# Patient Record
Sex: Male | Born: 1955 | Race: White | Hispanic: No | Marital: Single | Smoking: Former smoker
Health system: Southern US, Community
[De-identification: ages and names within clinical notes are randomized; demographics above are authoritative.]

## PROBLEM LIST (undated history)

## (undated) DIAGNOSIS — M1990 Unspecified osteoarthritis, unspecified site: Secondary | ICD-10-CM

## (undated) DIAGNOSIS — E039 Hypothyroidism, unspecified: Secondary | ICD-10-CM

## (undated) DIAGNOSIS — I255 Ischemic cardiomyopathy: Secondary | ICD-10-CM

## (undated) DIAGNOSIS — R945 Abnormal results of liver function studies: Secondary | ICD-10-CM

## (undated) DIAGNOSIS — I2510 Atherosclerotic heart disease of native coronary artery without angina pectoris: Secondary | ICD-10-CM

## (undated) DIAGNOSIS — M4800 Spinal stenosis, site unspecified: Secondary | ICD-10-CM

## (undated) DIAGNOSIS — H5461 Unqualified visual loss, right eye, normal vision left eye: Secondary | ICD-10-CM

## (undated) DIAGNOSIS — R569 Unspecified convulsions: Secondary | ICD-10-CM

## (undated) DIAGNOSIS — R7989 Other specified abnormal findings of blood chemistry: Secondary | ICD-10-CM

## (undated) DIAGNOSIS — I213 ST elevation (STEMI) myocardial infarction of unspecified site: Secondary | ICD-10-CM

## (undated) DIAGNOSIS — I639 Cerebral infarction, unspecified: Secondary | ICD-10-CM

## (undated) DIAGNOSIS — I5022 Chronic systolic (congestive) heart failure: Secondary | ICD-10-CM

## (undated) DIAGNOSIS — F419 Anxiety disorder, unspecified: Secondary | ICD-10-CM

## (undated) DIAGNOSIS — G459 Transient cerebral ischemic attack, unspecified: Secondary | ICD-10-CM

## (undated) HISTORY — PX: TONSILLECTOMY: SUR1361

## (undated) HISTORY — DX: Unspecified osteoarthritis, unspecified site: M19.90

## (undated) HISTORY — PX: APPENDECTOMY: SHX54

## (undated) HISTORY — DX: Other specified abnormal findings of blood chemistry: R79.89

## (undated) HISTORY — DX: Cerebral infarction, unspecified: I63.9

## (undated) HISTORY — DX: Spinal stenosis, site unspecified: M48.00

## (undated) HISTORY — PX: BACK SURGERY: SHX140

## (undated) HISTORY — DX: Chronic systolic (congestive) heart failure: I50.22

## (undated) HISTORY — DX: Atherosclerotic heart disease of native coronary artery without angina pectoris: I25.10

## (undated) HISTORY — PX: NECK SURGERY: SHX720

## (undated) HISTORY — DX: Transient cerebral ischemic attack, unspecified: G45.9

## (undated) HISTORY — DX: Unqualified visual loss, right eye, normal vision left eye: H54.61

## (undated) HISTORY — DX: ST elevation (STEMI) myocardial infarction of unspecified site: I21.3

## (undated) HISTORY — DX: Ischemic cardiomyopathy: I25.5

## (undated) HISTORY — DX: Abnormal results of liver function studies: R94.5

---

## 2012-02-14 ENCOUNTER — Emergency Department (HOSPITAL_COMMUNITY): Payer: 59

## 2012-02-14 ENCOUNTER — Encounter (HOSPITAL_COMMUNITY): Payer: Self-pay | Admitting: *Deleted

## 2012-02-14 DIAGNOSIS — E039 Hypothyroidism, unspecified: Secondary | ICD-10-CM | POA: Diagnosis present

## 2012-02-14 DIAGNOSIS — D649 Anemia, unspecified: Secondary | ICD-10-CM | POA: Diagnosis not present

## 2012-02-14 DIAGNOSIS — R0789 Other chest pain: Principal | ICD-10-CM | POA: Diagnosis present

## 2012-02-14 DIAGNOSIS — Z8249 Family history of ischemic heart disease and other diseases of the circulatory system: Secondary | ICD-10-CM

## 2012-02-14 DIAGNOSIS — E860 Dehydration: Secondary | ICD-10-CM | POA: Diagnosis present

## 2012-02-14 DIAGNOSIS — F411 Generalized anxiety disorder: Secondary | ICD-10-CM | POA: Diagnosis present

## 2012-02-14 DIAGNOSIS — G40909 Epilepsy, unspecified, not intractable, without status epilepticus: Secondary | ICD-10-CM | POA: Diagnosis present

## 2012-02-14 DIAGNOSIS — Z823 Family history of stroke: Secondary | ICD-10-CM

## 2012-02-14 DIAGNOSIS — F17200 Nicotine dependence, unspecified, uncomplicated: Secondary | ICD-10-CM | POA: Diagnosis present

## 2012-02-14 DIAGNOSIS — E785 Hyperlipidemia, unspecified: Secondary | ICD-10-CM | POA: Diagnosis present

## 2012-02-14 LAB — CBC WITH DIFFERENTIAL/PLATELET
Basophils Absolute: 0 10*3/uL (ref 0.0–0.1)
Eosinophils Relative: 2 % (ref 0–5)
HCT: 40.9 % (ref 39.0–52.0)
Lymphocytes Relative: 33 % (ref 12–46)
Lymphs Abs: 2.8 10*3/uL (ref 0.7–4.0)
MCV: 93.4 fL (ref 78.0–100.0)
Monocytes Absolute: 0.5 10*3/uL (ref 0.1–1.0)
RDW: 11.9 % (ref 11.5–15.5)
WBC: 8.6 10*3/uL (ref 4.0–10.5)

## 2012-02-14 LAB — POCT I-STAT TROPONIN I: Troponin i, poc: 0.01 ng/mL (ref 0.00–0.08)

## 2012-02-14 NOTE — ED Notes (Signed)
Pt states that at work his Chest started hurting about an hour and a half ago. Pt states that the pain went away and about 30 min ago the pain started again. Pt states that the pain went to his left fingers and felt like tingling. Pt took 2 baby aspirins at work.

## 2012-02-15 ENCOUNTER — Inpatient Hospital Stay (HOSPITAL_COMMUNITY)
Admission: EM | Admit: 2012-02-15 | Discharge: 2012-02-16 | DRG: 313 | Disposition: A | Discharge status: Home / Self Care | Payer: 59 | Attending: Internal Medicine | Admitting: Internal Medicine

## 2012-02-15 ENCOUNTER — Encounter (HOSPITAL_COMMUNITY): Payer: Self-pay | Admitting: Internal Medicine

## 2012-02-15 DIAGNOSIS — Z720 Tobacco use: Secondary | ICD-10-CM | POA: Diagnosis present

## 2012-02-15 DIAGNOSIS — Z7289 Other problems related to lifestyle: Secondary | ICD-10-CM

## 2012-02-15 DIAGNOSIS — F17200 Nicotine dependence, unspecified, uncomplicated: Secondary | ICD-10-CM

## 2012-02-15 DIAGNOSIS — I200 Unstable angina: Secondary | ICD-10-CM | POA: Diagnosis present

## 2012-02-15 DIAGNOSIS — R079 Chest pain, unspecified: Secondary | ICD-10-CM

## 2012-02-15 DIAGNOSIS — F1090 Alcohol use, unspecified, uncomplicated: Secondary | ICD-10-CM

## 2012-02-15 DIAGNOSIS — I209 Angina pectoris, unspecified: Secondary | ICD-10-CM

## 2012-02-15 HISTORY — DX: Hypothyroidism, unspecified: E03.9

## 2012-02-15 HISTORY — DX: Unspecified convulsions: R56.9

## 2012-02-15 HISTORY — DX: Anxiety disorder, unspecified: F41.9

## 2012-02-15 LAB — COMPREHENSIVE METABOLIC PANEL
BUN: 24 mg/dL — ABNORMAL HIGH (ref 6–23)
CO2: 24 mEq/L (ref 19–32)
Calcium: 10.4 mg/dL (ref 8.4–10.5)
Creatinine, Ser: 1.26 mg/dL (ref 0.50–1.35)
GFR calc Af Amer: 71 mL/min — ABNORMAL LOW (ref >90)
GFR calc non Af Amer: 61 mL/min — ABNORMAL LOW (ref >90)
Glucose, Bld: 104 mg/dL — ABNORMAL HIGH (ref 70–99)

## 2012-02-15 LAB — LIPID PANEL
HDL: 53 mg/dL (ref >39)
LDL Cholesterol: 178 mg/dL — ABNORMAL HIGH (ref 0–99)
Triglycerides: 97 mg/dL (ref <150)
VLDL: 19 mg/dL (ref 0–40)

## 2012-02-15 LAB — PROTIME-INR
INR: 1.02 (ref 0.00–1.49)
Prothrombin Time: 13.6 seconds (ref 11.6–15.2)

## 2012-02-15 LAB — TSH: TSH: 8.191 u[IU]/mL — ABNORMAL HIGH (ref 0.350–4.500)

## 2012-02-15 LAB — HEMOGLOBIN A1C: Hgb A1c MFr Bld: 5.7 % — ABNORMAL HIGH (ref <5.7)

## 2012-02-15 LAB — TROPONIN I
Troponin I: <0.3 ng/mL (ref <0.30)
Troponin I: <0.3 ng/mL (ref <0.30)

## 2012-02-15 LAB — POCT I-STAT TROPONIN I: Troponin i, poc: 0 ng/mL (ref 0.00–0.08)

## 2012-02-15 LAB — HEPARIN LEVEL (UNFRACTIONATED): Heparin Unfractionated: 0.19 IU/mL — ABNORMAL LOW (ref 0.30–0.70)

## 2012-02-15 LAB — MRSA PCR SCREENING: MRSA by PCR: NEGATIVE

## 2012-02-15 MED ORDER — HEPARIN BOLUS VIA INFUSION
2500.0000 [IU] | Freq: Once | INTRAVENOUS | Status: AC
  Administered 2012-02-15: 2500 [IU] via INTRAVENOUS
  Filled 2012-02-15: qty 2500

## 2012-02-15 MED ORDER — ASPIRIN EC 81 MG PO TBEC
81.0000 mg | DELAYED_RELEASE_TABLET | Freq: Every day | ORAL | Status: DC
  Administered 2012-02-16: 81 mg via ORAL
  Filled 2012-02-15: qty 1

## 2012-02-15 MED ORDER — CARVEDILOL 6.25 MG PO TABS
6.2500 mg | ORAL_TABLET | Freq: Two times a day (BID) | ORAL | Status: DC
  Administered 2012-02-15 – 2012-02-16 (×2): 6.25 mg via ORAL
  Filled 2012-02-15 (×4): qty 1

## 2012-02-15 MED ORDER — CARVEDILOL 3.125 MG PO TABS
3.1250 mg | ORAL_TABLET | Freq: Two times a day (BID) | ORAL | Status: DC
  Administered 2012-02-15: 3.125 mg via ORAL
  Filled 2012-02-15 (×3): qty 1

## 2012-02-15 MED ORDER — ONDANSETRON HCL 4 MG/2ML IJ SOLN: 4.0000 mg | Freq: Four times a day (QID) | INTRAMUSCULAR | Status: DC | PRN

## 2012-02-15 MED ORDER — ATORVASTATIN CALCIUM 40 MG PO TABS
40.0000 mg | ORAL_TABLET | Freq: Every day | ORAL | Status: DC
  Administered 2012-02-15: 40 mg via ORAL
  Filled 2012-02-15 (×2): qty 1

## 2012-02-15 MED ORDER — HEPARIN BOLUS VIA INFUSION
4000.0000 [IU] | Freq: Once | INTRAVENOUS | Status: AC
  Administered 2012-02-15: 4000 [IU] via INTRAVENOUS
  Filled 2012-02-15: qty 4000

## 2012-02-15 MED ORDER — ALPRAZOLAM 0.25 MG PO TABS
0.2500 mg | ORAL_TABLET | Freq: Two times a day (BID) | ORAL | Status: DC | PRN
  Administered 2012-02-15: 0.25 mg via ORAL
  Filled 2012-02-15: qty 1

## 2012-02-15 MED ORDER — NITROGLYCERIN 0.4 MG SL SUBL
0.4000 mg | SUBLINGUAL_TABLET | SUBLINGUAL | Status: DC | PRN
  Administered 2012-02-15: 0.4 mg via SUBLINGUAL
  Filled 2012-02-15: qty 25

## 2012-02-15 MED ORDER — ASPIRIN 81 MG PO CHEW
324.0000 mg | CHEWABLE_TABLET | ORAL | Status: AC
  Administered 2012-02-15: 324 mg via ORAL
  Filled 2012-02-15: qty 4

## 2012-02-15 MED ORDER — SODIUM CHLORIDE 0.9 % IJ SOLN: 3.0000 mL | INTRAMUSCULAR | Status: DC | PRN

## 2012-02-15 MED ORDER — SODIUM CHLORIDE 0.9 % IV SOLN: INTRAVENOUS | Status: DC

## 2012-02-15 MED ORDER — SODIUM CHLORIDE 0.9 % IV SOLN
INTRAVENOUS | Status: DC
  Administered 2012-02-15: 75 mL/h via INTRAVENOUS
  Administered 2012-02-16: 14:00:00 via INTRAVENOUS

## 2012-02-15 MED ORDER — SODIUM CHLORIDE 0.9 % IJ SOLN: 3.0000 mL | Freq: Two times a day (BID) | INTRAMUSCULAR | Status: DC

## 2012-02-15 MED ORDER — ASPIRIN 325 MG PO TABS
325.0000 mg | ORAL_TABLET | Freq: Once | ORAL | Status: AC
  Administered 2012-02-15: 325 mg via ORAL
  Filled 2012-02-15: qty 1

## 2012-02-15 MED ORDER — SODIUM CHLORIDE 0.9 % IV SOLN: 250.0000 mL | INTRAVENOUS | Status: DC | PRN

## 2012-02-15 MED ORDER — LEVOTHYROXINE SODIUM 112 MCG PO TABS
112.0000 ug | ORAL_TABLET | Freq: Every day | ORAL | Status: DC
  Administered 2012-02-15 – 2012-02-16 (×2): 112 ug via ORAL
  Filled 2012-02-15 (×3): qty 1

## 2012-02-15 MED ORDER — ASPIRIN 300 MG RE SUPP
300.0000 mg | RECTAL | Status: AC
  Filled 2012-02-15: qty 1

## 2012-02-15 MED ORDER — ATORVASTATIN CALCIUM 10 MG PO TABS
10.0000 mg | ORAL_TABLET | Freq: Every day | ORAL | Status: DC
  Filled 2012-02-15: qty 1

## 2012-02-15 MED ORDER — HEPARIN (PORCINE) IN NACL 100-0.45 UNIT/ML-% IJ SOLN
1550.0000 [IU]/h | INTRAMUSCULAR | Status: DC
  Administered 2012-02-15: 1200 [IU]/h via INTRAVENOUS
  Filled 2012-02-15 (×3): qty 250

## 2012-02-15 MED ORDER — ACETAMINOPHEN 325 MG PO TABS: 650.0000 mg | ORAL_TABLET | ORAL | Status: DC | PRN

## 2012-02-15 NOTE — H&P (Signed)
PCP:  Dr. Tresa Endo in [place]    Chief Complaint:   Chest pain  HPI: Randy Beasley is a 56 y.o. male   has a past medical history of Thyroid disease; Arthritis; Anxiety; and Seizures.   Presented with  He has been doing some work and started to have chest pain that at first lasted few seconds. 20 min later he had severe chest pain lasting for about 10-20 min. He had been getting nausea associated with this. No shortness of breath. Describes pain as substernal at first it was nagging. The second episode was sharp. Now he is having intermittent chest pressure. Currently it is almost completely resolved. He never had a stress test done. Never seen a cardiologist. He has had some stressful event recently being separated from his wife. HE states his chest pain is worse with exertion.   Review of Systems:    Pertinent positives include: chest pain, nausea,  Constitutional:  No weight loss, night sweats, Fevers, chills, fatigue, weight loss  HEENT:  No headaches, Difficulty swallowing,Tooth/dental problems,Sore throat,  No sneezing, itching, ear ache, nasal congestion, post nasal drip,  Cardio-vascular:  No  Orthopnea, PND, anasarca, dizziness, palpitations.no Bilateral lower extremity swelling  GI:  No heartburn, indigestion, abdominal pain,  vomiting, diarrhea, change in bowel habits, loss of appetite, melena, blood in stool, hematemesis Resp:  no shortness of breath at rest. No dyspnea on exertion, No excess mucus, no productive cough, No non-productive cough, No coughing up of blood.No change in color of mucus.No wheezing. Skin:  no rash or lesions. No jaundice GU:  no dysuria, change in color of urine, no urgency or frequency. No straining to urinate.  No flank pain.  Musculoskeletal:  No joint pain or no joint swelling. No decreased range of motion. No back pain.  Psych:  No change in mood or affect. No depression or anxiety. No memory loss.  Neuro: no localizing  neurological complaints, no tingling, no weakness, no double vision, no gait abnormality, no slurred speech, no confusion  Otherwise ROS are negative except for above, 10 systems were reviewed  Past Medical History: Past Medical History  Diagnosis Date  . Thyroid disease   . Arthritis   . Anxiety   . Seizures     20 years ago   Past Surgical History  Procedure Date  . Neck surgery      Medications: Prior to Admission medications   Medication Sig Start Date End Date Taking? Authorizing Provider  levothyroxine (SYNTHROID, LEVOTHROID) 112 MCG tablet Take 112 mcg by mouth daily.   Yes Historical Provider, MD  LINOLEIC ACID CONJUGATED PO Take 1 tablet by mouth daily.   Yes Historical Provider, MD  Multiple Vitamin (MULTIVITAMIN WITH MINERALS) TABS Take 1 tablet by mouth daily.   Yes Historical Provider, MD  Naproxen Sodium (ALEVE PO) Take 2 tablets by mouth daily.   Yes Historical Provider, MD  Nutritional Supplements (DHEA PO) Take 1 tablet by mouth daily.   Yes Historical Provider, MD    Allergies:  No Known Allergies  Social History:  Ambulatory  independently  Lives at home   reports that he has been smoking.  He does not have any smokeless tobacco history on file. He reports that he drinks alcohol. He reports that he does not use illicit drugs.   Family History: family history includes Heart disease in his brother; Hypertension in his mother; Peripheral vascular disease in his mother; and Stroke in his father.    Physical Exam: Patient  Vitals for the past 24 hrs:  BP Temp Temp src Pulse Resp SpO2 Height Weight  02/15/12 0434 135/100 mmHg 98.7 F (37.1 C) Oral 71  15  100 % 5\' 11"  (1.803 m) 92.987 kg (205 lb)  02/15/12 0258 128/96 mmHg 98.3 F (36.8 C) Oral 71  16  99 % - -  02/14/12 2255 144/99 mmHg 97.9 F (36.6 C) Oral 87  16  98 % - -    1. General:  in No Acute distress 2. Psychological: Alert and Oriented 3. Head/ENT:   Moist  Mucous Membranes                           Head Non traumatic, neck supple                          Normal  Dentition 4. SKIN: normal  Skin turgor,  Skin clean Dry and intact no rash 5. Heart: Regular rate and rhythm no Murmur, Rub or gallop 6. Lungs: Clear to auscultation bilaterally, no wheezes or crackles   7. Abdomen: Soft, non-tender, Non distended 8. Lower extremities: no clubbing, cyanosis, or edema 9. Neurologically Grossly intact, moving all 4 extremities equally 10. MSK: Normal range of motion  body mass index is 28.59 kg/(m^2).   Labs on Admission:   [facility] 02/14/12 2255  NA 139  K 3.9  CL 102  CO2 24  GLUCOSE 104*  BUN 24*  CREATININE 1.26  CALCIUM 10.4  MG --  PHOS --    Basename 02/14/12 2255  AST 38*  ALT 36  ALKPHOS 81  BILITOT 1.0  PROT 7.9  ALBUMIN 5.0   No results found for this basename: LIPASE:2,AMYLASE:2 in the last 72 hours  Basename 02/14/12 2255  WBC 8.6  NEUTROABS 5.0  HGB 14.2  HCT 40.9  MCV 93.4  PLT 282   No results found for this basename: CKTOTAL:3,CKMB:3,CKMBINDEX:3,TROPONINI:3 in the last 72 hours No results found for this basename: TSH,T4TOTAL,FREET3,T3FREE,THYROIDAB in the last 72 hours No results found for this basename: VITAMINB12:2,FOLATE:2,FERRITIN:2,TIBC:2,IRON:2,RETICCTPCT:2 in the last 72 hours No results found for this basename: HGBA1C    Estimated Creatinine Clearance: 74.5 ml/min (by C-G formula based on Cr of 1.26). ABG No results found for this basename: phart, pco2, po2, hco3, tco2, acidbasedef, o2sat     No results found for this basename: DDIMER     Other results:  I have pearsonaly reviewed this: ECG REPORT  Rate: 86  Rhythm: Normal Sinus ST&T Change: no ischemic changes   Cultures: No results found for this basename: sdes, specrequest, cult, reptstatus       Radiological Exams on Admission: Dg Chest 2 View  02/14/2012  *RADIOLOGY REPORT*  Clinical Data:  Left-sided chest pain.  CHEST - 2 VIEW  Comparison: None   Findings: The heart size and mediastinal contours are within normal limits.  Both lungs are clear.  The visualized skeletal structures are unremarkable.  IMPRESSION: No active disease.   Original Report Authenticated By: Reola Calkins, M.D.     Chart has been reviewed  Assessment/Plan  56 yo M with hx of tobacco abuse here with chest pain worrisome for unstable angina. So far cardiac markers are negative ECG non ischemic  Present on Admission:  .Unstable angina - will admit to step down, start on heparin, patient received aspirin already,. Will make sure he is on betablocker and statin. Keep NPO until he is  going to be seen by cardiology. Please call cardiology consult in AM .Chest pain - worrisome for angina .Tobacco abuse - spoke to him about quiting Alcohol use - he have been drinking heavier for the past 5 years. denies ever having any withdraw. Will need to watch him carefully for any withdrawal symptoms   Prophylaxis:  Lovenox, Protonix  CODE STATUS:FUUL CODE  Other plan as per orders.  I have spent a total of 55 min on this admission  Lorris Carducci 02/15/2012, 5:56 AM

## 2012-02-15 NOTE — ED Provider Notes (Signed)
History     CSN: [id number]  Arrival date & time 02/14/12  2249   First MD Initiated Contact with Patient 02/15/12 [phone]      Chief Complaint  Patient presents with  . Chest Pain    (Consider location/radiation/quality/duration/timing/severity/associated sxs/prior treatment) Patient is a 56 y.o. male presenting with chest pain. The history is provided by the patient.  Chest Pain Primary symptoms include shortness of breath and dizziness. Pertinent negatives for primary symptoms include no palpitations, no abdominal pain, no nausea and no vomiting.  Dizziness also occurs with diaphoresis. Dizziness does not occur with nausea or vomiting.   Associated symptoms include diaphoresis.    56 year old, male, smoker presents emergency department complaining of left-sided chest pain, with numbness in his left arm, associated with sweating, lightheadedness, and shortness of breath.  He denies nausea, vomiting.  He is never had these symptoms before.  He denies cough, fevers, chills, leg pain or swelling.  He took aspirin and now.  His symptoms have subsided.  His brother.  Also had a heart attack.  Past Medical History  Diagnosis Date  . Thyroid disease   . Arthritis   . Anxiety   . Seizures     20 years ago    Past Surgical History  Procedure Date  . Neck surgery     History reviewed. No pertinent family history.  History  Substance Use Topics  . Smoking status: Current Everyday Smoker  . Smokeless tobacco: Not on file  . Alcohol Use: Yes      Review of Systems  Constitutional: Positive for diaphoresis.  HENT: Negative for neck pain.   Respiratory: Positive for chest tightness and shortness of breath.   Cardiovascular: Positive for chest pain. Negative for palpitations and leg swelling.  Gastrointestinal: Negative for nausea, vomiting and abdominal pain.  Neurological: Positive for dizziness and light-headedness.  Psychiatric/Behavioral: Negative for confusion.  All  other systems reviewed and are negative.    Allergies  Review of patient's allergies indicates no known allergies.  Home Medications   Current Outpatient Rx  Name Route Sig Dispense Refill  . LEVOTHYROXINE SODIUM 112 MCG PO TABS Oral Take 112 mcg by mouth daily.    Marland Kitchen LINOLEIC ACID CONJUGATED PO Oral Take 1 tablet by mouth daily.    . ADULT MULTIVITAMIN W/MINERALS CH Oral Take 1 tablet by mouth daily.    . ALEVE PO Oral Take 2 tablets by mouth daily.    Marland Kitchen DHEA PO Oral Take 1 tablet by mouth daily.      BP 135/100  Pulse 71  Temp 98.7 F (37.1 C) (Oral)  Resp 15  Ht 5\' 11"  (1.803 m)  Wt 205 lb (92.987 kg)  BMI 28.59 kg/m2  SpO2 100%  Physical Exam  Nursing note and vitals reviewed. Constitutional: He is oriented to person, place, and time. He appears well-developed and well-nourished. No distress.  HENT:  Head: Normocephalic and atraumatic.  Eyes: Conjunctivae are normal.  Neck: Normal range of motion. Neck supple.  Cardiovascular: Normal rate, regular rhythm and intact distal pulses.   No murmur heard. Pulmonary/Chest: Effort normal and breath sounds normal. No respiratory distress. He has no rales.  Abdominal: Soft. Bowel sounds are normal. He exhibits no distension.  Musculoskeletal: Normal range of motion. He exhibits no edema and no tenderness.  Neurological: He is alert and oriented to person, place, and time.  Skin: Skin is warm and dry.  Psychiatric: He has a normal mood and affect. Thought content normal.  ED Course  Procedures (including critical care time) 56 year old, male, smoker presents emergency department with chest pain.  Left arm, numbness, shortness of breath, sweating, and lightheadedness.  Symptoms resolved.  Now.  He has never had these symptoms before.  EKG does not show any signs of cardiac ischemia.  His first set of cardiac enzymes, is negative.  Him concerned he has unstable angina.  Will give him aspirin and admit to the hospital for further  evaluation  Labs Reviewed  COMPREHENSIVE METABOLIC PANEL - Abnormal; Notable for the following:    Glucose, Bld 104 (*)     BUN 24 (*)     AST 38 (*)     GFR calc non Af Amer 61 (*)     GFR calc Af Amer 71 (*)     All other components within normal limits  CBC WITH DIFFERENTIAL  POCT I-STAT TROPONIN I   Dg Chest 2 View  02/14/2012  *RADIOLOGY REPORT*  Clinical Data:  Left-sided chest pain.  CHEST - 2 VIEW  Comparison: None  Findings: The heart size and mediastinal contours are within normal limits.  Both lungs are clear.  The visualized skeletal structures are unremarkable.  IMPRESSION: No active disease.   Original Report Authenticated By: Reola Calkins, M.D.      No diagnosis found.   Date: 02/15/2012  Rate: 86  Rhythm: normal sinus rhythm  QRS Axis: normal  Intervals: normal  ST/T Wave abnormalities: normal  Conduction Disutrbances: none  Narrative Interpretation: unremarkable     MDM  Unstable angina        Cheri Guppy, MD 02/15/12 [phone]

## 2012-02-15 NOTE — Progress Notes (Signed)
ANTICOAGULATION CONSULT NOTE - Initial Consult  Pharmacy Consult for Heparin  Indication: chest pain/ACS  No Known Allergies  Patient Measurements: Height: 5\' 11"  (180.3 cm) Weight: 205 lb (92.987 kg) IBW/kg (Calculated) : 75.3  Heparin Dosing Weight: 93kg  Vital Signs: Temp: 98.7 F (37.1 C) (09/01 0800) Temp src: Oral (09/01 0800) BP: 128/82 mmHg (09/01 0858) Pulse Rate: 66  (09/01 0858)  Labs:  Basename 02/14/12 2255  HGB 14.2  HCT 40.9  PLT 282  APTT --  LABPROT --  INR --  HEPARINUNFRC --  CREATININE 1.26  CKTOTAL --  CKMB --  TROPONINI --    Estimated Creatinine Clearance: 74.5 ml/min (by C-G formula based on Cr of 1.26).   Medical History: Past Medical History  Diagnosis Date  . Thyroid disease   . Arthritis   . Anxiety   . Seizures     20 years ago    Assessment: 86 yom admitted for possible ACS. Pharmacy consulted to dose heparin. Initial aptt/INR not yet resulted. Talked to patient and confirmed weight and any history of bleeding disorder. Hgb ok at 14.2, plts 282. Cardiac enzymes have been negative so far.   Goal of Therapy:  Heparin level 0.3-0.7 units/ml Monitor platelets by anticoagulation protocol: Yes   Plan:  1) Heparin bolus of 4000 units 2) Heparin drip 1200 units/hr 3) Obtain 6 hour heparin level  [employer], Pharm.D. Clinical Pharmacist   Pager: [phone] Phone: [phone] 02/15/2012 9:15 AM

## 2012-02-15 NOTE — Progress Notes (Signed)
ANTICOAGULATION CONSULT NOTE - Follow Up Consult  Pharmacy Consult for Heparin Indication: chest pain/ACS  No Known Allergies  Patient Measurements: Height: 5\' 11"  (180.3 cm) Weight: 205 lb (92.987 kg) IBW/kg (Calculated) : 75.3  Heparin Dosing Weight: 93kg  Vital Signs: Temp: 98.2 F (36.8 C) (09/01 1532) Temp src: Oral (09/01 1532) BP: 115/71 mmHg (09/01 1716) Pulse Rate: 72  (09/01 1716)  Labs:  Basename 02/15/12 1635 02/15/12 1402 02/15/12 0915 02/14/12 2255  HGB -- -- -- 14.2  HCT -- -- -- 40.9  PLT -- -- -- 282  APTT -- -- 30 --  LABPROT -- -- 13.6 --  INR -- -- 1.02 --  HEPARINUNFRC 0.19* -- -- --  CREATININE -- -- -- 1.26  CKTOTAL -- -- -- --  CKMB -- -- -- --  TROPONINI -- <0.30 <0.30 --    Estimated Creatinine Clearance: 74.5 ml/min (by C-G formula based on Cr of 1.26).   Medications:  Heparin 1200 units/hr  Assessment: Randy Beasley on heparin for ACS. Heparin level (0.19) is subtherapeutic. RN reports no problem with line or infusion. - BL INR: 1.02 - H/H and Plts wnl - No significant bleeding reported  Goal of Therapy:  Heparin level 0.3-0.7 units/ml Monitor platelets by anticoagulation protocol: Yes   Plan:  1. Heparin IV bolus 2500 units x 1 2. Increase heparin drip to 1550 units/hr (15.5 ml/hr) 3. Check heparin level 6 hours after rate increase 4. Daily HL and CBC  Cleon Dew [phone] 02/15/2012,5:52 PM

## 2012-02-15 NOTE — Consult Note (Signed)
CARDIOLOGY CONSULT NOTE    Patient ID: VENSON FERENCZ MRN: [id number] DOB/AGE: [birth year] 58 y.o.  Admit date: 02/15/2012 Referring Physician Calvert Cantor MD Primary Physician Olivia Canter MD, Kathryne Sharper, [place] Primary Cardiologist N/A Reason for Consultation Chest pain.  HPI: [name] is a 56 year old white male seen for evaluation of chest pain at the request of the hospitalist service. He has a history of tobacco abuse. He was admitted last night for evaluation chest pain. While at work he initially developed a burning sensation in his left precordium. This lasts for several minutes and then seemed to resolve. Later, while working in the hot humid environment he developed more severe left precordial chest pain. This was described as sharp and stabbing. It was more intense. He denied any shortness of breath or diaphoresis. He did have some tingling in the fingers of his left hand. The symptoms lasted 20-30 minutes and then seemed to resolve. However he has had recurrent episodes of chest pain even here in the hospital of a dull nature. Sublingual nitroglycerin did not seem to offer any benefit. He is currently pain-free. He denies any prior history of cardiac disease. He's had no prior stress testing. He smokes one half to one pack per day. He has a history of hypercholesterolemia that is untreated. He denies a history of hypertension or diabetes. His family history is remarkable for stroke and hypertension. He denies any claudication symptoms. He has no history of TIA or stroke. Patient is currently under increased stress related to marital difficulties.  Review of systems complete and found to be negative unless listed above   Past Medical History  Diagnosis Date  . Thyroid disease   . Arthritis   . Anxiety   . Seizures     20 years ago    Family History  Problem Relation Age of Onset  . Hypertension Mother   . Peripheral vascular disease Mother   . Stroke Father   . Heart disease  Brother     History   Social History  . Marital Status: Single    Spouse Name: N/A    Number of Children: N/A  . Years of Education: N/A   Occupational History  . Not on file.   Social History Main Topics  . Smoking status: Current Everyday Smoker -- 42 years  . Smokeless tobacco: Not on file  . Alcohol Use: Yes     case of beer a week, 3-4 beers a day, 5 years.  . Drug Use: No  . Sexually Active:    Other Topics Concern  . Not on file   Social History Narrative  . No narrative on file    Past Surgical History  Procedure Date  . Neck surgery      Prescriptions prior to admission  Medication Sig Dispense Refill  . levothyroxine (SYNTHROID, LEVOTHROID) 112 MCG tablet Take 112 mcg by mouth daily.      Marland Kitchen LINOLEIC ACID CONJUGATED PO Take 1 tablet by mouth daily.      . Multiple Vitamin (MULTIVITAMIN WITH MINERALS) TABS Take 1 tablet by mouth daily.      . Naproxen Sodium (ALEVE PO) Take 2 tablets by mouth daily.      . Nutritional Supplements (DHEA PO) Take 1 tablet by mouth daily.        Physical Exam: Blood pressure 128/82, pulse 66, temperature 98 F (36.7 C), temperature source Oral, resp. rate 14, height 5\' 11"  (1.803 m), weight 92.987 kg (205 lb), SpO2 99.00%.  He is a pleasant , middle-aged white male in no acute distress. The patient is alert and oriented x 3.  The mood and affect are normal.  The skin is warm and dry.  Color is normal.  The HEENT exam reveals that the sclera are nonicteric.  The mucous membranes are moist.  The carotids are 2+ without bruits.  There is no thyromegaly.  There is no JVD. He has an old cervical surgical scar. The lungs are clear.  The chest wall is non tender.  The heart exam reveals a regular rate with a normal S1 and S2.  There are no murmurs, gallops, or rubs.  The PMI is not displaced.   Abdominal exam reveals good bowel sounds.  There is no guarding or rebound.  There is no hepatosplenomegaly or tenderness.  There are no masses.   Exam of the legs reveal no clubbing, cyanosis, or edema.  The legs are without rashes.  The distal pulses are intact.  Cranial nerves II - XII are intact.  Motor and sensory functions are intact.  The gait is normal.  Labs:   Lab Results  Component Value Date   WBC 8.6 02/14/2012   HGB 14.2 02/14/2012   HCT 40.9 02/14/2012   MCV 93.4 02/14/2012   PLT 282 02/14/2012    Lab 02/14/12 2255  NA 139  K 3.9  CL 102  CO2 24  BUN 24*  CREATININE 1.26  CALCIUM 10.4  PROT 7.9  BILITOT 1.0  ALKPHOS 81  ALT 36  AST 38*  GLUCOSE 104*   Lab Results  Component Value Date   TROPONINI <0.30 02/15/2012    Lab Results  Component Value Date   CHOL 250* 02/15/2012   Lab Results  Component Value Date   HDL 53 02/15/2012   Lab Results  Component Value Date   LDLCALC 178* 02/15/2012   Lab Results  Component Value Date   TRIG 97 02/15/2012   Lab Results  Component Value Date   CHOLHDL 4.7 02/15/2012   No results found for this basename: LDLDIRECT      Radiology: No active cardiopulmonary disease EKG: Normal sinus rhythm, normal ECG.  ASSESSMENT AND PLAN:  1. Chest pain. He has typical and atypical features for angina. Cardiac risk factors include tobacco use, hyperlipidemia, and family history of vascular disease. Initial ECG and cardiac enzymes were all negative. Recommend continued IV heparin. Will treat with aspirin, beta blocker, and statin. We will arrange a stress Myoview study in the a.m.  2. Hypercholesterolemia. Recommend starting statin therapy.  3. Tobacco abuse. Patient counseled on the need for smoking cessation.  SignedTheron Arista [facility] 02/15/2012, 11:42 AM

## 2012-02-15 NOTE — Progress Notes (Signed)
[facility]  56 y/o male admitted this AM with c/o chest pain. He is alert and able to provide a history. His pain began yesterday evening while on the phone at work- he describes it as burning, dull - it faded within a min. It occurred again when he was doing stremous activity at work (moving items). At this time it was intense, tight and caused him to stop working and sit down. It was about 8/10 and eventually faded when we relaxed. At that time he noted tingling in his left 4th and 5th fingers. He was diaphoretic but this was from the exertion even prior to the pain. His co-worker decided to bring him to the ER and en route he had milder episodes. In there ER he noted the pain intensified after ambulating to have blood work done. Pain appeared to improved but upon my eval he admits to 2/10 pain, pointing to his left upper chest, radiating in between his shoulder blades, not improving with Nitro. He feels anxious and is hesitant to take an anxiolytic but eventually states he will try it.  While obtaining history, he admits a great deal of stress in his personal life currently. He has been smoking 1-1 1/2 packs of cigarrets per day, drinking about 2 beers on week days and about 4 on weekends.  Case discussed with Dr Graciela Husbands who will evaluate in consult.   Physical Exam is within normal limits- no reproducible pain on exam  A/P Active Problems:  Chest pain Angina vs stress induced- suspect he will at the minimum need a stress test- EKG is unremarkable and Troponins are negative thus far. B Blocker and ASA added- ECHO, Lipid panel and A1c ordered.    Tobacco abuse Nicotine patch offered but declined by the patient.    Alcohol use Monitor for withdrawal - doubtful that he will have withdrawal based on the amount of drinking he admits to.   Hypothyroid Cont Synthroid- TSH ordered  Dehydration Based on BUn/Cr ratio and clinical exam- start slow hydration and f/u labs in AM  Anxiety Prn  Xanax for now  Arthritis Takes daily Naprosyn especially on work days due to chronic back pain  Remote history of seizures Not on medication for this currently  Calvert Cantor, MD [phone]

## 2012-02-15 NOTE — ED Notes (Signed)
MD at bedside. 

## 2012-02-16 ENCOUNTER — Inpatient Hospital Stay (HOSPITAL_COMMUNITY): Payer: 59

## 2012-02-16 DIAGNOSIS — I200 Unstable angina: Secondary | ICD-10-CM

## 2012-02-16 DIAGNOSIS — R079 Chest pain, unspecified: Secondary | ICD-10-CM

## 2012-02-16 DIAGNOSIS — Z7289 Other problems related to lifestyle: Secondary | ICD-10-CM

## 2012-02-16 LAB — CBC
Hemoglobin: 12.6 g/dL — ABNORMAL LOW (ref 13.0–17.0)
MCHC: 33.8 g/dL (ref 30.0–36.0)
RDW: 12.2 % (ref 11.5–15.5)
WBC: 6.8 10*3/uL (ref 4.0–10.5)

## 2012-02-16 LAB — PROTIME-INR
INR: 1.02 (ref 0.00–1.49)
Prothrombin Time: 13.6 seconds (ref 11.6–15.2)

## 2012-02-16 LAB — BASIC METABOLIC PANEL
GFR calc Af Amer: 84 mL/min — ABNORMAL LOW (ref >90)
GFR calc non Af Amer: 72 mL/min — ABNORMAL LOW (ref >90)
Potassium: 4.1 mEq/L (ref 3.5–5.1)
Sodium: 138 mEq/L (ref 135–145)

## 2012-02-16 MED ORDER — ASPIRIN 81 MG PO TBEC: 81.0000 mg | DELAYED_RELEASE_TABLET | Freq: Every day | ORAL | Status: AC

## 2012-02-16 MED ORDER — TECHNETIUM TC 99M TETROFOSMIN IV KIT
30.0000 | PACK | Freq: Once | INTRAVENOUS | Status: AC | PRN
  Administered 2012-02-16: 30 via INTRAVENOUS

## 2012-02-16 MED ORDER — TECHNETIUM TC 99M TETROFOSMIN IV KIT
10.0000 | PACK | Freq: Once | INTRAVENOUS | Status: AC | PRN
  Administered 2012-02-16: 10 via INTRAVENOUS

## 2012-02-16 MED ORDER — ATORVASTATIN CALCIUM 40 MG PO TABS: 40.0000 mg | ORAL_TABLET | Freq: Every day | ORAL | Status: DC

## 2012-02-16 MED ORDER — REGADENOSON 0.4 MG/5ML IV SOLN
INTRAVENOUS | Status: AC
  Administered 2012-02-16: 0.4 mg
  Filled 2012-02-16: qty 5

## 2012-02-16 NOTE — Progress Notes (Signed)
ANTICOAGULATION CONSULT NOTE - Follow Up Consult  Pharmacy Consult for Heparin Indication: chest pain/ACS  No Known Allergies  Patient Measurements: Height: 5\' 11"  (180.3 cm) Weight: 205 lb (92.987 kg) IBW/kg (Calculated) : 75.3  Heparin Dosing Weight: 93kg  Vital Signs: Temp: 98 F (36.7 C) (09/02 1155) Temp src: Oral (09/02 1155) BP: 116/83 mmHg (09/02 1155) Pulse Rate: 59  (09/02 1155)  Labs:  Basename 02/16/12 1146 02/16/12 0520 02/16/12 0035 02/15/12 2032 02/15/12 1635 02/15/12 1402 02/15/12 0915 02/14/12 2255  HGB -- 12.6* -- -- -- -- -- 14.2  HCT -- 37.3* -- -- -- -- -- 40.9  PLT -- 233 -- -- -- -- -- 282  APTT -- -- -- -- -- -- 30 --  LABPROT 13.6 -- -- -- -- -- 13.6 --  INR 1.02 -- -- -- -- -- 1.02 --  HEPARINUNFRC 0.28* -- 0.76* -- 0.19* -- -- --  CREATININE -- 1.10 -- -- -- -- -- 1.26  CKTOTAL -- -- -- -- -- -- -- --  CKMB -- -- -- -- -- -- -- --  TROPONINI -- -- -- <0.30 -- <0.30 <0.30 --    Estimated Creatinine Clearance: 85.3 ml/min (by C-G formula based on Cr of 1.1).   Medications:  Heparin 1200 units/hr  Assessment: 56yom on heparin for ACS. Heparin level (0.28) is only slightly subtherapeutic. Pt went to stress test today and level was drawn a little late; however, heparin infusion was still running according to RN.  - BL INR: 1.02 - H/H and Plts wnl - No significant bleeding reported  Goal of Therapy:  Heparin level 0.3-0.7 units/ml Monitor platelets by anticoagulation protocol: Yes   Plan:  1. Will not bolus at this time as patient is only slightly subtherapeutic. 2. Increase heparin drip back up to 1550 units/hr 3. Check heparin level 6 hours after rate increase 4. Daily HL and CBC  Thank you, [employer], Pharm.D. Clinical Pharmacist   Pager: [phone] Phone: [phone] 02/16/2012 2:22 PM

## 2012-02-16 NOTE — Progress Notes (Signed)
Patient: Randy Beasley Date of Encounter: 02/16/2012, 9:53 AM Admit date: 02/15/2012     Subjective  No CP or SOB. Stress myoview attempted - pt exercised to 8:22 limited by leg fatigue but was unable to reach target HR so test changed to Lexiscan. Tolerated well. Await images.   Objective   Telemetry: unable to review as pt seen down in nuclear lab Physical Exam: Filed Vitals:   02/16/12 0922  BP: 121/85  Pulse: 54  Temp: 97.8  Resp: 16   General: Well developed, well nourished WM in no acute distress. Head: Normocephalic, atraumatic, sclera non-icteric, no xanthomas, nares are without discharge.  Neck: Negative for carotid bruits. JVD not elevated. Lungs: Clear bilaterally to auscultation without wheezes, rales, or rhonchi. Breathing is unlabored. Heart: RRR S1 S2 without murmurs, rubs, or gallops.  Abdomen: Soft, non-tender, non-distended with normoactive bowel sounds. No hepatomegaly. No rebound/guarding. No obvious abdominal masses. Msk:  Strength and tone appear normal for age. Extremities: No clubbing or cyanosis. No edema.  Distal pedal pulses are 2+ and equal bilaterally. Neuro: Alert and oriented X 3. Moves all extremities spontaneously. Psych:  Responds to questions appropriately with a normal affect.    Intake/Output Summary (Last 24 hours) at 02/16/12 0953 Last data filed at 02/16/12 0437  Gross per 24 hour  Intake  659.5 ml  Output   1100 ml  Net -440.5 ml    Inpatient Medications:    . aspirin EC  81 mg Oral Daily  . atorvastatin  40 mg Oral q1800  . carvedilol  6.25 mg Oral BID WC  . heparin  2,500 Units Intravenous Once  . heparin  4,000 Units Intravenous Once  . levothyroxine  112 mcg Oral QAC breakfast  . sodium chloride  3 mL Intravenous Q12H  . DISCONTD: atorvastatin  10 mg Oral q1800  . DISCONTD: carvedilol  3.125 mg Oral BID WC    Labs:  [facility] 02/16/12 0520 02/14/12 2255  NA 138 139  K 4.1 3.9  CL 106 102  CO2 22 24  GLUCOSE 92 104*   BUN 25* 24*  CREATININE 1.10 1.26  CALCIUM 8.9 10.4  MG -- --  PHOS -- --    Basename 02/14/12 2255  AST 38*  ALT 36  ALKPHOS 81  BILITOT 1.0  PROT 7.9  ALBUMIN 5.0     Basename 02/16/12 0520 02/14/12 2255  WBC 6.8 8.6  NEUTROABS -- 5.0  HGB 12.6* 14.2  HCT 37.3* 40.9  MCV 95.2 93.4  PLT 233 282    Basename 02/15/12 2032 02/15/12 1402 02/15/12 0915  CKTOTAL -- -- --  CKMB -- -- --  TROPONINI <0.30 <0.30 <0.30      Basename 02/15/12 0915  HGBA1C 5.7*    Basename 02/15/12 0924  CHOL 250*  HDL 53  LDLCALC 178*  TRIG 97  CHOLHDL 4.7    Basename 02/15/12 0915  TSH 8.191*  T4TOTAL --  T3FREE --  THYROIDAB --   Radiology/Studies:  Dg Chest 2 View  02/14/2012  *RADIOLOGY REPORT*  Clinical Data:  Left-sided chest pain.  CHEST - 2 VIEW  Comparison: None  Findings: The heart size and mediastinal contours are within normal limits.  Both lungs are clear.  The visualized skeletal structures are unremarkable.  IMPRESSION: No active disease.   Original Report Authenticated By: Reola Calkins, M.D.      Assessment and Plan   Pt seen briefly in nuclear lab for stress test. MD note to follow.  1. Chest pain. He has typical and atypical features for angina. Cardiac risk factors include tobacco use, hyperlipidemia, and family history of vascular disease. Initial ECG and cardiac enzymes were all negative. Will treat with aspirin, beta blocker, and statin. MD to advise re: heparin but this can likely be discontinued. Myoview results pending. 2. Hypercholesterolemia. Statin initiated 3. Tobacco abuse. Patient counseled on the need for smoking  4. Thyroid disease: TSH is high, defer mgmt to primary team. 5. Anemia: Hgb slightly lower today. May need trending or further OP monitoring.   Signed, Dayna Dunn PA-C Jesse Sans. Daleen Squibb, MD, [facility] Pager:  [phone]

## 2012-02-16 NOTE — Progress Notes (Signed)
ANTICOAGULATION CONSULT NOTE - Follow Up Consult  Pharmacy Consult for heparin Indication: chest pain/ACS  Labs:  Basename 02/16/12 0035 02/15/12 2032 02/15/12 1635 02/15/12 1402 02/15/12 0915 02/14/12 2255  HGB -- -- -- -- -- 14.2  HCT -- -- -- -- -- 40.9  PLT -- -- -- -- -- 282  APTT -- -- -- -- 30 --  LABPROT -- -- -- -- 13.6 --  INR -- -- -- -- 1.02 --  HEPARINUNFRC 0.76* -- 0.19* -- -- --  CREATININE -- -- -- -- -- 1.26  CKTOTAL -- -- -- -- -- --  CKMB -- -- -- -- -- --  TROPONINI -- <0.30 -- <0.30 <0.30 --    Assessment: 56yo male now slightly supratherapeutic on heparin after rate increase.  Goal of Therapy:  Heparin level 0.3-0.7 units/ml   Plan:  Will decrease heparin gtt to 1400 units/hr, between rates at which levels were low and high, and check level in 6hr.  Colleen Can PharmD BCPS 02/16/2012,1:19 AM

## 2012-02-16 NOTE — Discharge Summary (Signed)
DISCHARGE SUMMARY  Randy Beasley  MR#: [id number]  DOB:Mar 23, 1956  Date of Admission: 02/15/2012 Date of Discharge: 02/16/2012  Attending Physician:Calandra Madura T  Patient's PCP:  Dr. Tresa Endo in [place], [place]  Consults: [facility]  Disposition: D/C Home  Follow-up Appts: Follow-up Information    Call to follow up. (Call your primary MD to schedule a follow-up visit in 5-7 days )         Discharge Diagnoses: Chest pain - noncardiac - etiology unclear  Tobacco abuse  Alcohol use  Hypothyroid  Dehydration  Anxiety  Arthritis  Remote history of seizures   Initial presentation: 56 y/o male admitted this AM with c/o chest pain. He is alert and able to provide a history. His pain began yesterday evening while on the phone at work- he describes it as burning, dull - it faded within a min. It occurred again when he was doing stremous activity at work (moving items). At this time it was intense, tight and caused him to stop working and sit down. It was about 8/10 and eventually faded when we relaxed. At that time he noted tingling in his left 4th and 5th fingers. He was diaphoretic but this was from the exertion even prior to the pain. His co-worker decided to bring him to the ER and en route he had milder episodes. In there ER he noted the pain intensified after ambulating to have blood work done. Pain appeared to improved but upon my eval he admits to 2/10 pain, pointing to his left upper chest, radiating in between his shoulder blades, not improving with Nitro. He feels anxious and is hesitant to take an anxiolytic but eventually states he will try it.  While obtaining history, he admits a great deal of stress in his personal life currently. He has been smoking 1-1 1/2 packs of cigarrets per day, drinking about 2 beers on week days and about 4 on weekends.   Hospital Course:  Chest pain  negative troponins - negative Lexiscan Myoview; NL LVF - no further cardiac workup -  recommend f/u with primary MD to exclude non cardiac causes for CP - D/C tobacco smoking - ASA 81 daily for primary prevention.  Tobacco abuse  Nicotine patch offered but declined by the patient - pt counseled on need to d/c tobacco use completely  Alcohol use  No evidence of withdrawal - advised to d/c EtOH due to possible link to sx via gastritis/esophagitis   Hypothyroid  Cont Synthroid - TSH elevated - will not adjust dose here but suggest f/u w/ PCP in 5-7 days  Dehydration  Resolved w/ volume resuscitation   Anxiety  Well controlled at present  Arthritis  Takes daily Naprosyn especially on work days due to chronic back pain   Remote history of seizures  Not on medication for this currently - no seizure activity during this admit  Medication List  As of 02/16/2012  3:12 PM   STOP taking these medications         ALEVE PO         TAKE these medications         aspirin 81 MG EC tablet   Take 1 tablet (81 mg total) by mouth daily.      atorvastatin 40 MG tablet   Commonly known as: LIPITOR   Take 1 tablet (40 mg total) by mouth daily at 6 PM.      DHEA PO   Take 1 tablet by mouth daily.  levothyroxine 112 MCG tablet   Commonly known as: SYNTHROID, LEVOTHROID   Take 112 mcg by mouth daily.      LINOLEIC ACID CONJUGATED PO   Take 1 tablet by mouth daily.      multivitamin with minerals Tabs   Take 1 tablet by mouth daily.           Day of Discharge BP 116/83  Pulse 59  Temp 98 F (36.7 C) (Oral)  Resp 19  Ht 5\' 11"  (1.803 m)  Wt 92.987 kg (205 lb)  BMI 28.59 kg/m2  SpO2 100%  Physical Exam: General: No acute respiratory distress Lungs: Clear to auscultation bilaterally without wheezes or crackles Cardiovascular: Regular rate and rhythm without murmur gallop or rub normal S1 and S2 Abdomen: Nontender, nondistended, soft, bowel sounds positive, no rebound, no ascites, no appreciable mass Extremities: No significant cyanosis, clubbing, or edema  bilateral lower extremities  CBC     Status: Abnormal   Collection Time   02/16/12  5:20 AM      Component Value Range   WBC 6.8  4.0 - 10.5 K/uL   RBC 3.92 (*) 4.22 - 5.81 MIL/uL   Hemoglobin 12.6 (*) 13.0 - 17.0 g/dL   HCT 40.9 (*) 81.1 - 91.4 %   MCV 95.2  78.0 - 100.0 fL   MCH 32.1  26.0 - 34.0 pg   MCHC 33.8  30.0 - 36.0 g/dL   RDW 78.2  95.6 - 21.3 %   Platelets 233  150 - 400 K/uL  BASIC METABOLIC PANEL     Status: Abnormal   Collection Time   02/16/12  5:20 AM      Component Value Range   Sodium 138  135 - 145 mEq/L   Potassium 4.1  3.5 - 5.1 mEq/L   Chloride 106  96 - 112 mEq/L   CO2 22  19 - 32 mEq/L   Glucose, Bld 92  70 - 99 mg/dL   BUN 25 (*) 6 - 23 mg/dL   Creatinine, Ser 0.86  0.50 - 1.35 mg/dL   Calcium 8.9  8.4 - 57.8 mg/dL   GFR calc non Af Amer 72 (*) >90 mL/min   GFR calc Af Amer 84 (*) >90 mL/min   Time spent in discharge (includes decision making & examination of pt): 30 minutes  02/16/2012, 3:12 PM   Lonia Blood, MD [facility] Office  [phone] Pager [phone]  On-Call/Text Page:      [email or web address]      password [email or web address]

## 2012-02-16 NOTE — Progress Notes (Signed)
Patient: Randy Beasley Date of Encounter: 02/16/2012, 2:16 PM Admit date: 02/15/2012     Subjective  No further CP, since admission   Objective   Telemetry:  NSR/SB Physical Exam: Filed Vitals:   02/16/12 1155  BP: 116/83  Pulse: 59  Temp: 98 F (36.7 C)  Resp: 19   General: Well developed, well nourished, in no acute distress. Head: Normocephalic, atraumatic, sclera non-icteric, no xanthomas, nares are without discharge.  Neck: Negative for carotid bruits. JVD not elevated. Lungs: Clear bilaterally to auscultation without wheezes, rales, or rhonchi. Breathing is unlabored. Heart: RRR S1 S2 without murmurs, rubs, or gallops.  Abdomen: Soft, non-tender, non-distended with normoactive bowel sounds. No hepatomegaly. No rebound/guarding. No obvious abdominal masses. Msk:  Strength and tone appear normal for age. Extremities: No clubbing or cyanosis. No edema.  Distal pedal pulses are 2+ and equal bilaterally. Neuro: Alert and oriented X 3. Moves all extremities spontaneously. Psych:  Responds to questions appropriately with a normal affect.    Intake/Output Summary (Last 24 hours) at 02/16/12 1416 Last data filed at 02/16/12 1354  Gross per 24 hour  Intake 2059.46 ml  Output   1800 ml  Net 259.46 ml    Inpatient Medications:    . aspirin EC  81 mg Oral Daily  . atorvastatin  40 mg Oral q1800  . carvedilol  6.25 mg Oral BID WC  . heparin  2,500 Units Intravenous Once  . levothyroxine  112 mcg Oral QAC breakfast  . regadenoson      . sodium chloride  3 mL Intravenous Q12H    Labs:  [facility] 02/16/12 0520 02/14/12 2255  NA 138 139  K 4.1 3.9  CL 106 102  CO2 22 24  GLUCOSE 92 104*  BUN 25* 24*  CREATININE 1.10 1.26  CALCIUM 8.9 10.4  MG -- --  PHOS -- --    Basename 02/14/12 2255  AST 38*  ALT 36  ALKPHOS 81  BILITOT 1.0  PROT 7.9  ALBUMIN 5.0   No results found for this basename: LIPASE:2,AMYLASE:2 in the last 72 hours  Basename 02/16/12 0520  02/14/12 2255  WBC 6.8 8.6  NEUTROABS -- 5.0  HGB 12.6* 14.2  HCT 37.3* 40.9  MCV 95.2 93.4  PLT 233 282    Basename 02/15/12 2032 02/15/12 1402 02/15/12 0915  CKTOTAL -- -- --  CKMB -- -- --  TROPONINI <0.30 <0.30 <0.30   No components found with this basename: POCBNP:3 No results found for this basename: DDIMER in the last 72 hours  Basename 02/15/12 0915  HGBA1C 5.7*    Basename 02/15/12 0924  CHOL 250*  HDL 53  LDLCALC 178*  TRIG 97  CHOLHDL 4.7    Basename 02/15/12 0915  TSH 8.191*  T4TOTAL --  T3FREE --  THYROIDAB --   No results found for this basename: VITAMINB12,FOLATE,FERRITIN,TIBC,IRON,RETICCTPCT in the last 72 hours  Radiology/Studies:  Dg Chest 2 View  02/14/2012  *RADIOLOGY REPORT*  Clinical Data:  Left-sided chest pain.  CHEST - 2 VIEW  Comparison: None  Findings: The heart size and mediastinal contours are within normal limits.  Both lungs are clear.  The visualized skeletal structures are unremarkable.  IMPRESSION: No active disease.   Original Report Authenticated By: Reola Calkins, M.D.    Nm Myocar Multi W/spect W/Jazier Mcglamery Motion / Ef  02/16/2012  *RADIOLOGY REPORT*  Clinical Data:  56 year old smoker presenting with chest pain.  MYOCARDIAL IMAGING WITH SPECT (REST AND PHARMACOLOGIC-STRESS) GATED LEFT  VENTRICULAR Wilfred Siverson MOTION STUDY LEFT VENTRICULAR EJECTION FRACTION  Technique:  Standard myocardial SPECT imaging was performed after resting intravenous injection of 10 mCi Tc-72m tetrofosmin. Subsequently, intravenous infusion of regadenoson was performed under the supervision of the Cardiology staff.  At peak effect of the drug, 30 mCi Tc-38m tetrofosmin was injected intravenously and standard myocardial SPECT  imaging was performed.  Quantitative gated imaging was also performed to evaluate left ventricular Caelen Higinbotham motion, and estimate left ventricular ejection fraction.  Comparison:  None.  Findings: Immediate post-regadenoson images demonstrate slight  diminished uptake in the inferior Lorane Cousar relative to the remainder of the myocardium that can be explained on the basis of diaphragmatic attenuation.  Initial resting images demonstrate similar findings. No evidence of reversibility to suggest ischemia.  Findings confirmed by the computer generated polar map.  Gated images demonstrate satisfactory thickening throughout the left ventricular myocardium with normal Samantha Ragen motion throughout.  Estimated Q G S ejection fraction measured 52%, with an end- diastolic volume of 114 ml and an end-systolic volume of 52 ml.  IMPRESSION:  1.  No evidence of myocardial ischemia or infarction. Diaphragmatic attenuation of the inferior Kellen Hover. 2.  Normal left ventricular Omri Bertran motion. 3.  Estimated Q G S ejection fraction 52%.   Original Report Authenticated By: Arnell Sieving, M.D.      Assessment and Plan  1 CP  - negative troponins  - negative Lexiscan Myoview; NL LVF  PLAN: No further cardiac workup. Recommend f/u with primary MD to exclude non cardiac causes for CP. DC tobacco smoking. Consider starting ASA 81 daily for primary prevention.    Signed, SERPE, EUGENE PA-C  Fahed Morten C. Daleen Squibb, MD, [facility] Pager:  [phone]

## 2013-09-15 ENCOUNTER — Other Ambulatory Visit: Payer: Self-pay | Admitting: Family Medicine

## 2013-09-15 ENCOUNTER — Ambulatory Visit (INDEPENDENT_AMBULATORY_CARE_PROVIDER_SITE_OTHER): Payer: BC Managed Care – PPO

## 2013-09-15 DIAGNOSIS — M47817 Spondylosis without myelopathy or radiculopathy, lumbosacral region: Secondary | ICD-10-CM

## 2013-09-15 DIAGNOSIS — M545 Low back pain, unspecified: Secondary | ICD-10-CM

## 2014-04-28 ENCOUNTER — Inpatient Hospital Stay (HOSPITAL_COMMUNITY)
Admission: EM | Admit: 2014-04-28 | Discharge: 2014-05-02 | DRG: 247 | Disposition: A | Discharge status: Home / Self Care | Payer: BC Managed Care – PPO | Source: Other Acute Inpatient Hospital | Attending: Cardiology | Admitting: Cardiology

## 2014-04-28 ENCOUNTER — Encounter (HOSPITAL_COMMUNITY)
Admission: EM | Disposition: A | Discharge status: Home / Self Care | Payer: BC Managed Care – PPO | Source: Other Acute Inpatient Hospital | Attending: Cardiology

## 2014-04-28 ENCOUNTER — Encounter (HOSPITAL_COMMUNITY): Payer: Self-pay

## 2014-04-28 ENCOUNTER — Ambulatory Visit (HOSPITAL_COMMUNITY): Admit: 2014-04-28 | Payer: Self-pay | Admitting: Cardiology

## 2014-04-28 DIAGNOSIS — I2582 Chronic total occlusion of coronary artery: Secondary | ICD-10-CM | POA: Diagnosis present

## 2014-04-28 DIAGNOSIS — R740 Nonspecific elevation of levels of transaminase and lactic acid dehydrogenase [LDH]: Secondary | ICD-10-CM | POA: Diagnosis present

## 2014-04-28 DIAGNOSIS — F419 Anxiety disorder, unspecified: Secondary | ICD-10-CM | POA: Diagnosis present

## 2014-04-28 DIAGNOSIS — F1010 Alcohol abuse, uncomplicated: Secondary | ICD-10-CM | POA: Diagnosis present

## 2014-04-28 DIAGNOSIS — I2584 Coronary atherosclerosis due to calcified coronary lesion: Secondary | ICD-10-CM | POA: Diagnosis present

## 2014-04-28 DIAGNOSIS — E039 Hypothyroidism, unspecified: Secondary | ICD-10-CM | POA: Diagnosis present

## 2014-04-28 DIAGNOSIS — I2510 Atherosclerotic heart disease of native coronary artery without angina pectoris: Secondary | ICD-10-CM | POA: Diagnosis present

## 2014-04-28 DIAGNOSIS — I255 Ischemic cardiomyopathy: Secondary | ICD-10-CM | POA: Diagnosis present

## 2014-04-28 DIAGNOSIS — Z720 Tobacco use: Secondary | ICD-10-CM | POA: Diagnosis present

## 2014-04-28 DIAGNOSIS — R7401 Elevation of levels of liver transaminase levels: Secondary | ICD-10-CM

## 2014-04-28 DIAGNOSIS — Z7902 Long term (current) use of antithrombotics/antiplatelets: Secondary | ICD-10-CM

## 2014-04-28 DIAGNOSIS — Z79899 Other long term (current) drug therapy: Secondary | ICD-10-CM | POA: Diagnosis not present

## 2014-04-28 DIAGNOSIS — Z7289 Other problems related to lifestyle: Secondary | ICD-10-CM | POA: Diagnosis present

## 2014-04-28 DIAGNOSIS — Z823 Family history of stroke: Secondary | ICD-10-CM | POA: Diagnosis not present

## 2014-04-28 DIAGNOSIS — R079 Chest pain, unspecified: Secondary | ICD-10-CM

## 2014-04-28 DIAGNOSIS — I2102 ST elevation (STEMI) myocardial infarction involving left anterior descending coronary artery: Principal | ICD-10-CM | POA: Diagnosis present

## 2014-04-28 DIAGNOSIS — R509 Fever, unspecified: Secondary | ICD-10-CM

## 2014-04-28 DIAGNOSIS — E785 Hyperlipidemia, unspecified: Secondary | ICD-10-CM | POA: Diagnosis present

## 2014-04-28 DIAGNOSIS — Z7982 Long term (current) use of aspirin: Secondary | ICD-10-CM | POA: Diagnosis not present

## 2014-04-28 DIAGNOSIS — Z8249 Family history of ischemic heart disease and other diseases of the circulatory system: Secondary | ICD-10-CM | POA: Diagnosis not present

## 2014-04-28 DIAGNOSIS — R0789 Other chest pain: Secondary | ICD-10-CM | POA: Diagnosis present

## 2014-04-28 DIAGNOSIS — I213 ST elevation (STEMI) myocardial infarction of unspecified site: Secondary | ICD-10-CM

## 2014-04-28 DIAGNOSIS — F17210 Nicotine dependence, cigarettes, uncomplicated: Secondary | ICD-10-CM | POA: Diagnosis present

## 2014-04-28 DIAGNOSIS — Z955 Presence of coronary angioplasty implant and graft: Secondary | ICD-10-CM

## 2014-04-28 DIAGNOSIS — Z789 Other specified health status: Secondary | ICD-10-CM | POA: Diagnosis present

## 2014-04-28 HISTORY — PX: CORONARY ANGIOPLASTY WITH STENT PLACEMENT: SHX49

## 2014-04-28 HISTORY — DX: ST elevation (STEMI) myocardial infarction of unspecified site: I21.3

## 2014-04-28 HISTORY — PX: LEFT HEART CATH: SHX5478

## 2014-04-28 LAB — COMPREHENSIVE METABOLIC PANEL
ALBUMIN: 4 g/dL (ref 3.5–5.2)
ALT: 241 U/L — ABNORMAL HIGH (ref 0–53)
ANION GAP: 23 — AB (ref 5–15)
AST: 189 U/L — ABNORMAL HIGH (ref 0–37)
Alkaline Phosphatase: 95 U/L (ref 39–117)
BILIRUBIN TOTAL: 0.3 mg/dL (ref 0.3–1.2)
BUN: 15 mg/dL (ref 6–23)
CHLORIDE: 103 meq/L (ref 96–112)
CO2: 15 mEq/L — ABNORMAL LOW (ref 19–32)
CREATININE: 1.08 mg/dL (ref 0.50–1.35)
Calcium: 9.6 mg/dL (ref 8.4–10.5)
GFR calc Af Amer: 84 mL/min — ABNORMAL LOW (ref >90)
GFR, EST NON AFRICAN AMERICAN: 73 mL/min — AB (ref >90)
GLUCOSE: 112 mg/dL — AB (ref 70–99)
Potassium: 4 mEq/L (ref 3.7–5.3)
Sodium: 141 mEq/L (ref 137–147)
Total Protein: 7.4 g/dL (ref 6.0–8.3)

## 2014-04-28 LAB — CBC
HEMATOCRIT: 41.5 % (ref 39.0–52.0)
Hemoglobin: 14.5 g/dL (ref 13.0–17.0)
MCH: 32.2 pg (ref 26.0–34.0)
MCHC: 34.9 g/dL (ref 30.0–36.0)
MCV: 92.2 fL (ref 78.0–100.0)
Platelets: 299 10*3/uL (ref 150–400)
RBC: 4.5 MIL/uL (ref 4.22–5.81)
RDW: 12.4 % (ref 11.5–15.5)
WBC: 9.6 10*3/uL (ref 4.0–10.5)

## 2014-04-28 LAB — APTT: aPTT: 103 seconds — ABNORMAL HIGH (ref 24–37)

## 2014-04-28 LAB — POCT I-STAT, CHEM 8
BUN: 16 mg/dL (ref 6–23)
CHLORIDE: 111 meq/L (ref 96–112)
Calcium, Ion: 1.05 mmol/L — ABNORMAL LOW (ref 1.13–1.30)
Creatinine, Ser: 1.1 mg/dL (ref 0.50–1.35)
GLUCOSE: 114 mg/dL — AB (ref 70–99)
HCT: 48 % (ref 39.0–52.0)
Hemoglobin: 16.3 g/dL (ref 13.0–17.0)
Potassium: 3.8 mEq/L (ref 3.7–5.3)
Sodium: 141 mEq/L (ref 137–147)
TCO2: 16 mmol/L (ref 0–100)

## 2014-04-28 LAB — MRSA PCR SCREENING: MRSA by PCR: NEGATIVE

## 2014-04-28 LAB — POCT I-STAT TROPONIN I: Troponin i, poc: 0.15 ng/mL (ref 0.00–0.08)

## 2014-04-28 LAB — PROTIME-INR
INR: 1.05 (ref 0.00–1.49)
Prothrombin Time: 13.8 seconds (ref 11.6–15.2)

## 2014-04-28 LAB — TROPONIN I: Troponin I: >20 ng/mL (ref <0.30)

## 2014-04-28 SURGERY — LEFT HEART CATH
Anesthesia: LOCAL | Site: Hand | Laterality: Right

## 2014-04-28 MED ORDER — NITROGLYCERIN IN D5W 200-5 MCG/ML-% IV SOLN
INTRAVENOUS | Status: AC
  Filled 2014-04-28: qty 250

## 2014-04-28 MED ORDER — ONDANSETRON HCL 4 MG/2ML IJ SOLN
INTRAMUSCULAR | Status: AC
  Filled 2014-04-28: qty 2

## 2014-04-28 MED ORDER — ACETAMINOPHEN 325 MG PO TABS: 650.0000 mg | ORAL_TABLET | ORAL | Status: DC | PRN

## 2014-04-28 MED ORDER — MORPHINE SULFATE 4 MG/ML IJ SOLN
4.0000 mg | Freq: Once | INTRAMUSCULAR | Status: AC
  Administered 2014-04-28: 4 mg via INTRAVENOUS

## 2014-04-28 MED ORDER — ASPIRIN EC 81 MG PO TBEC
81.0000 mg | DELAYED_RELEASE_TABLET | Freq: Every day | ORAL | Status: DC
Start: 2014-04-29 — End: 2014-05-02
  Administered 2014-04-29 – 2014-05-02 (×4): 81 mg via ORAL
  Filled 2014-04-28 (×4): qty 1

## 2014-04-28 MED ORDER — LIDOCAINE HCL (PF) 1 % IJ SOLN
INTRAMUSCULAR | Status: AC
  Filled 2014-04-28: qty 30

## 2014-04-28 MED ORDER — MIDAZOLAM HCL 2 MG/2ML IJ SOLN
INTRAMUSCULAR | Status: AC
  Filled 2014-04-28: qty 2

## 2014-04-28 MED ORDER — ASPIRIN 81 MG PO CHEW
81.0000 mg | CHEWABLE_TABLET | Freq: Every day | ORAL | Status: DC
Start: 2014-04-28 — End: 2014-04-28

## 2014-04-28 MED ORDER — NITROGLYCERIN IN D5W 200-5 MCG/ML-% IV SOLN
0.0000 ug/min | INTRAVENOUS | Status: DC
  Administered 2014-04-28: 5 ug/min via INTRAVENOUS

## 2014-04-28 MED ORDER — ATORVASTATIN CALCIUM 80 MG PO TABS
80.0000 mg | ORAL_TABLET | Freq: Every day | ORAL | Status: DC
  Administered 2014-04-28 – 2014-05-01 (×4): 80 mg via ORAL
  Filled 2014-04-28 (×5): qty 1

## 2014-04-28 MED ORDER — ACETAMINOPHEN 325 MG PO TABS
650.0000 mg | ORAL_TABLET | ORAL | Status: DC | PRN
  Administered 2014-04-29 – 2014-05-01 (×7): 650 mg via ORAL
  Filled 2014-04-28 (×8): qty 2

## 2014-04-28 MED ORDER — TICAGRELOR 90 MG PO TABS
90.0000 mg | ORAL_TABLET | Freq: Two times a day (BID) | ORAL | Status: DC
  Administered 2014-04-28 – 2014-05-02 (×8): 90 mg via ORAL
  Filled 2014-04-28 (×9): qty 1

## 2014-04-28 MED ORDER — HEPARIN (PORCINE) IN NACL 2-0.9 UNIT/ML-% IJ SOLN
INTRAMUSCULAR | Status: AC
  Filled 2014-04-28: qty 1000

## 2014-04-28 MED ORDER — TICAGRELOR 90 MG PO TABS: 90.0000 mg | ORAL_TABLET | Freq: Two times a day (BID) | ORAL | Status: DC

## 2014-04-28 MED ORDER — TICAGRELOR 90 MG PO TABS
ORAL_TABLET | ORAL | Status: AC
  Administered 2014-04-29: 90 mg via ORAL
  Filled 2014-04-28: qty 2

## 2014-04-28 MED ORDER — SODIUM CHLORIDE 0.9 % IV SOLN
0.2500 mg/kg/h | INTRAVENOUS | Status: DC
  Filled 2014-04-28: qty 250

## 2014-04-28 MED ORDER — SODIUM CHLORIDE 0.9 % IV SOLN
1.0000 mL/kg/h | INTRAVENOUS | Status: AC
  Administered 2014-04-28: 1 mL/kg/h via INTRAVENOUS

## 2014-04-28 MED ORDER — ONDANSETRON HCL 4 MG/2ML IJ SOLN
4.0000 mg | Freq: Once | INTRAMUSCULAR | Status: AC
  Administered 2014-04-28: 4 mg via INTRAVENOUS

## 2014-04-28 MED ORDER — SODIUM CHLORIDE 0.9 % IV SOLN
INTRAVENOUS | Status: DC
  Administered 2014-04-28: 20:00:00 via INTRAVENOUS

## 2014-04-28 MED ORDER — NITROGLYCERIN 1 MG/10 ML FOR IR/CATH LAB
INTRA_ARTERIAL | Status: AC
  Filled 2014-04-28: qty 10

## 2014-04-28 MED ORDER — HEPARIN SODIUM (PORCINE) 5000 UNIT/ML IJ SOLN
5000.0000 [IU] | Freq: Three times a day (TID) | INTRAMUSCULAR | Status: DC
  Administered 2014-04-29 – 2014-05-02 (×9): 5000 [IU] via SUBCUTANEOUS
  Filled 2014-04-28 (×13): qty 1

## 2014-04-28 MED ORDER — VERAPAMIL HCL 2.5 MG/ML IV SOLN
INTRAVENOUS | Status: AC
  Filled 2014-04-28: qty 2

## 2014-04-28 MED ORDER — METOPROLOL TARTRATE 12.5 MG HALF TABLET
12.5000 mg | ORAL_TABLET | Freq: Two times a day (BID) | ORAL | Status: DC
  Administered 2014-04-28 – 2014-05-02 (×8): 12.5 mg via ORAL
  Filled 2014-04-28 (×9): qty 1

## 2014-04-28 MED ORDER — HEPARIN SODIUM (PORCINE) 1000 UNIT/ML IJ SOLN
4000.0000 [IU] | Freq: Once | INTRAMUSCULAR | Status: AC
  Administered 2014-04-28: 4000 [IU] via INTRAVENOUS
  Filled 2014-04-28: qty 4

## 2014-04-28 MED ORDER — ONDANSETRON HCL 4 MG/2ML IJ SOLN: 4.0000 mg | Freq: Four times a day (QID) | INTRAMUSCULAR | Status: DC | PRN

## 2014-04-28 MED ORDER — NITROGLYCERIN 0.4 MG SL SUBL
0.4000 mg | SUBLINGUAL_TABLET | SUBLINGUAL | Status: DC | PRN
  Administered 2014-04-29 (×3): 0.4 mg via SUBLINGUAL
  Filled 2014-04-28 (×2): qty 1

## 2014-04-28 MED ORDER — BIVALIRUDIN 250 MG IV SOLR
INTRAVENOUS | Status: AC
  Filled 2014-04-28: qty 250

## 2014-04-28 MED ORDER — MORPHINE SULFATE 2 MG/ML IJ SOLN
INTRAMUSCULAR | Status: AC
  Filled 2014-04-28: qty 2

## 2014-04-28 MED ORDER — LEVOTHYROXINE SODIUM 112 MCG PO TABS
112.0000 ug | ORAL_TABLET | Freq: Every day | ORAL | Status: DC
  Administered 2014-04-29 – 2014-05-02 (×4): 112 ug via ORAL
  Filled 2014-04-28 (×5): qty 1

## 2014-04-28 MED ORDER — FENTANYL CITRATE 0.05 MG/ML IJ SOLN
INTRAMUSCULAR | Status: AC
  Filled 2014-04-28: qty 2

## 2014-04-28 NOTE — ED Provider Notes (Signed)
CSN: [id number]     Arrival date & time 04/28/14  2014 History   None    Chief Complaint  Patient presents with  . Code STEMI     (Consider location/radiation/quality/duration/timing/severity/associated sxs/prior Treatment) Patient is a 58 y.o. male presenting with chest pain. The history is provided by the patient. No language interpreter was used.  Chest Pain Pain location:  Substernal area Pain quality: sharp   Pain radiates to:  Neck and L arm Pain radiates to the back: no   Pain severity:  Severe Onset quality:  Sudden Duration:  10 minutes Timing:  Constant Progression:  Worsening Chronicity:  New Context: at rest   Relieved by:  Nothing Worsened by:  Exertion Associated symptoms: shortness of breath   Associated symptoms: no abdominal pain, no cough, no fever, no headache, no nausea and not vomiting     Past Medical History  Diagnosis Date  . Thyroid disease   . Arthritis   . Anxiety   . Seizures     20 years ago  . Hypothyroidism    Past Surgical History  Procedure Laterality Date  . Neck surgery    . Back surgery    . Appendectomy    . Tonsillectomy     Family History  Problem Relation Age of Onset  . Hypertension Mother   . Peripheral vascular disease Mother   . Stroke Father   . Heart disease Brother    History  Substance Use Topics  . Smoking status: Current Every Day Smoker -- 0.50 packs/day for 42 years    Types: Cigarettes  . Smokeless tobacco: Former NeurosurgeonUser    Quit date: 02/14/1982  . Alcohol Use: 0.0 oz/week    3-5 Cans of beer per week     Comment: case of beer a week, 3-4 beers a day, 5 years.    Review of Systems  Constitutional: Negative for fever.  HENT: Negative for congestion, rhinorrhea and sore throat.   Respiratory: Positive for shortness of breath. Negative for cough.   Cardiovascular: Positive for chest pain.  Gastrointestinal: Negative for nausea, vomiting, abdominal pain and diarrhea.  Genitourinary: Negative for  dysuria and hematuria.  Skin: Negative for rash.  Neurological: Positive for light-headedness. Negative for syncope and headaches.  All other systems reviewed and are negative.     Allergies  Review of patient's allergies indicates no known allergies.  Home Medications   Prior to Admission medications   Medication Sig Start Date End Date Taking? Authorizing Provider  atorvastatin (LIPITOR) 40 MG tablet Take 1 tablet (40 mg total) by mouth daily at 6 PM. 02/16/12 02/15/13  Lonia BloodJeffrey T McClung, MD  levothyroxine (SYNTHROID, LEVOTHROID) 112 MCG tablet Take 112 mcg by mouth daily.    Historical Provider, MD  LINOLEIC ACID CONJUGATED PO Take 1 tablet by mouth daily.    Historical Provider, MD  Multiple Vitamin (MULTIVITAMIN WITH MINERALS) TABS Take 1 tablet by mouth daily.    Historical Provider, MD  Nutritional Supplements (DHEA PO) Take 1 tablet by mouth daily.    Historical Provider, MD   BP 135/97 mmHg  Pulse 78  Temp(Src) 99 F (37.2 C) (Oral)  Resp 15  Ht 5\' 10"  (1.778 m)  Wt 217 lb 2.5 oz (98.5 kg)  BMI 31.16 kg/m2  SpO2 97% Physical Exam  Constitutional: He is oriented to person, place, and time. He appears well-developed and well-nourished.  HENT:  Head: Normocephalic and atraumatic.  Right Ear: External ear normal.  Left Ear: External ear  normal.  Eyes: EOM are normal.  Neck: Normal range of motion. Neck supple.  Cardiovascular: Normal rate, regular rhythm and intact distal pulses.  Exam reveals no gallop and no friction rub.   No murmur heard. Pulmonary/Chest: Effort normal and breath sounds normal. No respiratory distress. He has no wheezes. He has no rales. He exhibits no tenderness.  Abdominal: Soft. Bowel sounds are normal. He exhibits no distension. There is no tenderness. There is no rebound.  Musculoskeletal: Normal range of motion. He exhibits no edema or tenderness.  Lymphadenopathy:    He has no cervical adenopathy.  Neurological: He is alert and oriented to  person, place, and time.  Skin: Skin is warm. No rash noted. He is diaphoretic.  Psychiatric: He has a normal mood and affect. His behavior is normal.  Nursing note and vitals reviewed.   ED Course  Procedures (including critical care time) Labs Review Labs Reviewed  APTT - Abnormal; Notable for the following:    aPTT 103 (*)    All other components within normal limits  COMPREHENSIVE METABOLIC PANEL - Abnormal; Notable for the following:    CO2 15 (*)    Glucose, Bld 112 (*)    AST 189 (*)    ALT 241 (*)    GFR calc non Af Amer 73 (*)    GFR calc Af Amer 84 (*)    Anion gap 23 (*)    All other components within normal limits  TROPONIN I - Abnormal; Notable for the following:    Troponin I >20.00 (*)    All other components within normal limits  POCT I-STAT TROPONIN I - Abnormal; Notable for the following:    Troponin i, poc 0.15 (*)    All other components within normal limits  POCT I-STAT, CHEM 8 - Abnormal; Notable for the following:    Glucose, Bld 114 (*)    Calcium, Ion 1.05 (*)    All other components within normal limits  MRSA PCR SCREENING  CBC  PROTIME-INR  BASIC METABOLIC PANEL  LIPID PANEL  CBC  TROPONIN I  TROPONIN I  I-STAT TROPOININ, ED  I-STAT CHEM 8, ED    Imaging Review No results found.   EKG Interpretation None      MDM   Final diagnoses:  None    8:19 PM Pt is a 58 y.o. male with pertinent PMHX of hypothyrodism who presents to the ED with code stemi. Sudden onset of substernal left sided chest pain that radiates into left arm and jaw.. Given 2 SL nitro and 6 morphine with EMS. Took ASA today: 4, 81 mg ASA. No fevers or recent illness. No immobilization. No previous DVT or PE. No illicit drug abuse. Smoked 1/2 PPd for 30 years. Endorses associated shortness of breath and nausea but no vomiting  Nitro drip started for pain. Heparin given. Cardiology at bedside with  Plan to go to cath lab  EKG personally reviewed by myself showed  anteroseptal STEMI with inferior changes Rate of 89, PR 171ms, QRS 96ms QT/QTC 381/44272ms, normal axis, with evidence of new ischemia. Comparison showed sinus bradycardia, indication: chest pain  Review of labs: Troponin: <20.00 istat troponin: 0.15 CBc: no leukocytosis, H&H 14.5/41.5 CMP elevated AST/ALT INR: 1.05  Patient taken emergently to cath lab for anteroseptal STEMI  Labs and EKG reviewed by myself and considered in medical decision making if ordered.  Imaging interpreted by radiology. Pt was discussed with my attending, Dr. Harland GermanJacubowitz     Dontrell Stuck Peter  Modesto Charon, MD 04/29/14 [id number]  Doug Sou, MD 04/29/14 [phone]

## 2014-04-28 NOTE — ED Notes (Signed)
Per EMS, Patient had chest pain last night that subsided. Patient started to have [place] Chest Pain today at around 1910. When EMS arrived, Patient was sitting in a recliner calling for them to help. Patient administered 324 mg of Aspirin before EMS arrived. EMS gave 2 NItro SL, 4 mg of Zofran, 6mg  of Morphine. Patient was placed on 15 L of NRB oxygen. Patient complained of chest pain that radiated to his left arm and jaw. Patient arrived to ED alert and oriented x4. Vitals per EMS 163/115, 87 Irregular, 24 RR, 100 % on 15 L NRB.

## 2014-04-28 NOTE — ED Notes (Signed)
MD Felix PaciniJacobuwitz and MD Cardiology at the bedside when patient arrived. Patient alert and oriented. Patient speaking with the MD.

## 2014-04-28 NOTE — H&P (Signed)
Patient ID: Randy Beasley MRN: [id number], DOB/AGE: 08-25-1955   Admit date: 04/28/2014   Primary Physician: No primary care provider on file. Primary Cardiologist: None  CC: STEMI  Problem List  Past Medical History  Diagnosis Date  . Thyroid disease   . Arthritis   . Anxiety   . Seizures     20 years ago  . Hypothyroidism     Past Surgical History  Procedure Laterality Date  . Neck surgery    . Back surgery    . Appendectomy    . Tonsillectomy       Allergies  No Known Allergies  HPI 58M HLD, hypothyroidism who reported stuttering chest discomfort for the past 24 hours. He said that yesterday evening he had some chest discomfort that seemed to him like heartburn, so he took an antacid and went to sleep. It mildly persisted over the course of the day but at about 7:20pm he developed severe substernal crushing chest pain of 10/10 intensity. He called EMS and initial EKG in the field revealed anteroseptal STE. He was started on ASA, heparin, and a nitro gtt.  As a result the cath lab was activated. Angiography revealed prox LAD occlusion, which was treated with a 3.5x63m Promus DES. He is chest pain free after PCI.  Risk factors: HTN: N HLD: Y DM: N Smoker: N Prior cardiac history: N  Home Medications  Prior to Admission medications   Medication Sig Start Date End Date Taking? Authorizing Provider  atorvastatin (LIPITOR) 40 MG tablet Take 1 tablet (40 mg total) by mouth daily at 6 PM. 02/16/12 02/15/13  JCherene Altes MD  levothyroxine (SYNTHROID, LEVOTHROID) 112 MCG tablet Take 112 mcg by mouth daily.    Historical Provider, MD  LINOLEIC ACID CONJUGATED PO Take 1 tablet by mouth daily.    Historical Provider, MD  Multiple Vitamin (MULTIVITAMIN WITH MINERALS) TABS Take 1 tablet by mouth daily.    Historical Provider, MD  Nutritional Supplements (DHEA PO) Take 1 tablet by mouth daily.    Historical Provider, MD    Family History  Family History    Problem Relation Age of Onset  . Hypertension Mother   . Peripheral vascular disease Mother   . Stroke Father   . Heart disease Brother     Social History  History   Social History  . Marital Status: Single    Spouse Name: N/A    Number of Children: N/A  . Years of Education: N/A   Occupational History  . Not on file.   Social History Main Topics  . Smoking status: Former Smoker -- 0.00 packs/day for 42 years    Types: Cigarettes    Quit date: 07/29/2013  . Smokeless tobacco: Former [employer] developer   Quit date: 02/14/1982  . Alcohol Use: 0.0 oz/week    3-5 Cans of beer per week     Comment: case of beer a week, 3-4 beers a day, 5 years.  . Drug Use: No  . Sexual Activity: Yes   Other Topics Concern  . Not on file   Social History Narrative     Review of Systems General:  No chills, fever, night sweats or weight changes.  Cardiovascular:  +chest pain, +dyspnea on exertion, no edema, orthopnea, palpitations, paroxysmal nocturnal dyspnea. Dermatological: No rash, lesions/masses Respiratory: No cough, dyspnea Urologic: No hematuria, dysuria Abdominal:   No nausea, vomiting, diarrhea, bright red blood per rectum, melena, or hematemesis Neurologic:  No visual changes, wkns,  changes in mental status. All other systems reviewed and are otherwise negative except as noted above.  Physical Exam (in ED)  Blood pressure 123/89, pulse 84, temperature 99 F (37.2 C), temperature source Oral, resp. rate 30, height _0  (1.778 m), weight 217 lb 2.5 oz (98.5 kg), SpO2 92 %.  General: Severe distress Psych: Normal affect. Neuro: Alert and oriented X 3. Moves all extremities spontaneously. HEENT: Normal  Neck: Supple without bruits or JVD. Lungs:  Resp regular and unlabored, CTA. Heart: RRR no s3, s4, or murmurs. Abdomen: Soft, non-tender, non-distended, BS + x 4.  Extremities: No clubbing, cyanosis or edema. DP/PT/Radials 2+ and equal bilaterally.  Labs  Troponin [facility] of [facility]  Test)  Recent Labs  04/28/14 2031  TROPIPOC 0.15*   No results for input(s): CKTOTAL, CKMB, TROPONINI in the last 72 hours. Lab Results  Component Value Date   WBC 9.6 04/28/2014   HGB 16.3 04/28/2014   HCT 48.0 04/28/2014   MCV 92.2 04/28/2014   PLT 299 04/28/2014    Recent Labs Lab 04/28/14 2022 04/28/14 2040  NA 141 141  K 4.0 3.8  CL 103 111  CO2 15*  --   BUN 15 16  CREATININE 1.08 1.10  CALCIUM 9.6  --   PROT 7.4  --   BILITOT 0.3  --   ALKPHOS 95  --   ALT 241*  --   AST 189*  --   GLUCOSE 112* 114*    Radiology/Studies  CXR pending  ECG SR_1 , 1-37m STE in V1-2 with corresponding reciprocal changes in the inferior leads  Cardiac cath  Left mainstem: Mild calcification distally. No obstructive disease.   Left anterior descending (LAD): Large vessel. 100% Occluded after the first diagonal.  Left circumflex (LCx): The LCx gives rise to 2 obtuse marginal branches. The first OM has a 60-70% stenosis proximally. The remainder of the LCx is without disease.   Right coronary artery (RCA): There is a 50% focal stenosis in the proximal vessel. There is a segmental 50% stenosis in the mid vessel. The distal RCA has mild disease up to 30%.  Left ventriculography: Performed at the end of procedure Left ventricular systolic function is abnormal, LVEF is estimated at 40%.There is apical adn anterior wall dyskinesis. There is no significant mitral regurgitation   PCI Note: Following the diagnostic procedure, the decision was made to proceed with PCI. Weight-based bivalirudin was given for anticoagulation. Once a therapeutic ACT was achieved, a 6 [place] 3.5 guide catheter was inserted. A prowater coronary guidewire was used to cross the lesion. The lesion was predilated with a 2.5 mm balloon.  With reperfusion there was a long lesion noted in the mid LAD extending from the first diagonal to past the second diagonal. The lesion was then stented with a 3.5  x 38 mm Promus stent deployed up to 14 atm with good stent expansion. Following PCI, there was 0% residual stenosis and TIMI-3 flow. Final angiography confirmed an excellent result. The patient tolerated the procedure well and was pain free. There were no immediate procedural complications. A TR band was used for radial hemostasis. The patient was transferred to the post catheterization recovery area for further monitoring.  PCI Data: Vessel - LAD/Segment - mid Percent Stenosis (pre) 100% TIMI-flow 0 Stent 3.5 x 38 mm Promus Percent Stenosis (post) 0% TIMI-flow (post) 3  Final Conclusions:  1. Single vessel occlusive CAD. Culprit lesion in the LAD following the first diagonal. Moderate disease in the  LCX and RCA. 2. Moderate LV dysfunction.   ASSESSMENT AND PLAN 17M with severe CP, found to have prox-mid LAD occlusion, successfully treated with DES  1. STEMI -ASA indefinitely, ticagrelor 92m bid x at least 1 year -metoprolol 12.564mbid, can uptitrate as necessary -ACEi deferred at this time but would recommend prior to discharge -atorvastatin 8091maily -can consider eplerenone given LVEF 40% and mild sxs of CHF on admission once ACEi dose is adjusted  2. Hyperlipidemia -increase atorvastatin  3. Hypothyroidism -continue synthroid  Signed, VORRaliegh IpD MPH 04/28/2014, 10:51 PM

## 2014-04-28 NOTE — Progress Notes (Signed)
   04/28/14 2000  Clinical Encounter Type  Visited With Health care provider  Visit Type Initial;Code  Stress Factors  Patient Stress Factors Health changes   Chaplain responded to a code stemi page 8:05PM. Chaplain checked in with the medical team who was actively working with the patient. Patient was in distress and in a lot of pain. EMS contacted the patient's wife. Chaplain is waiting for patient's wife to arrive. Patient is currently in Cath Lab. Page Merrilyn Puman-Call chaplain if needed. Cran[place] NeighborStrother, Moon Budde R, Chaplain  8:46 PM

## 2014-04-28 NOTE — Plan of Care (Signed)
Problem: Consults Goal: Skin Care Protocol Initiated - if Braden Score 18 or less If consults are not indicated, leave blank or document N/A Outcome: Not Applicable Date Met:  90/79/31 Goal: Tobacco Cessation referral if indicated Outcome: Progressing Goal: Nutrition Consult-if indicated Outcome: Not Applicable Date Met:  02/28/55 Goal: Diabetes Guidelines if Diabetic/Glucose > 140 If diabetic or lab glucose is > 140 mg/dl - Initiate Diabetes/Hyperglycemia Guidelines & Document Interventions  Outcome: Not Applicable Date Met:  02/78/29  Problem: Phase I Progression Outcomes Goal: Anginal pain relieved Outcome: Completed/Met Date Met:  04/28/14 Goal: Aspirin unless contraindicated Outcome: Completed/Met Date Met:  04/28/14 Goal: Voiding-avoid urinary catheter unless indicated Outcome: Completed/Met Date Met:  04/28/14 Goal: Hemodynamically stable Outcome: Completed/Met Date Met:  04/28/14 Goal: Vascular site scale level 0 - I Vascular Site Scale Level 0: No bruising/bleeding/hematoma Level I (Mild): Bruising/Ecchymosis, minimal bleeding/ooozing, palpable hematoma < 3 cm Level II (Moderate): Bleeding not affecting hemodynamic parameters, pseudoaneurysm, palpable hematoma > 3 cm Level III (Severe) Bleeding which affects hemodynamic parameters or retroperitoneal hemorrhage  Outcome: Completed/Met Date Met:  04/28/14

## 2014-04-28 NOTE — CV Procedure (Signed)
    Cardiac Catheterization Procedure Note  Name: Randy Beasley MRN: [id number] DOB: 03-15-1956  Procedure: Left Heart Cath, Selective Coronary Angiography, LV angiography, PTCA and stenting of the LAD  Indication: 58 yo WM presents with an anterior STEMI.  Procedural Details:  The right wrist was prepped, draped, and anesthetized with 1% lidocaine. Using the modified Seldinger technique, a 6 French slender sheath was introduced into the right radial artery. 3 mg of verapamil was administered through the sheath, weight-based unfractionated heparin was administered intravenously. Standard Judkins catheters were used for selective coronary angiography and left ventriculography. Catheter exchanges were performed over an exchange length guidewire.  PROCEDURAL FINDINGS Hemodynamics: AO 104/80 mm Hg LV 105/24 mm Hg   Coronary angiography: Coronary dominance: right  Left mainstem: Mild calcification distally. No obstructive disease.   Left anterior descending (LAD): Large vessel. 100% Occluded after the first diagonal.  Left circumflex (LCx): The LCx gives rise to 2 obtuse marginal branches. The first OM has a 60-70% stenosis proximally. The remainder of the LCx is without disease.   Right coronary artery (RCA): There is a 50% focal stenosis in the proximal vessel. There is a segmental 50% stenosis in the mid vessel. The distal RCA has mild disease up to 30%.  Left ventriculography: Performed at the end of procedure Left ventricular systolic function is abnormal, LVEF is estimated at 40%.There is apical adn anterior wall dyskinesis. There is no significant mitral regurgitation   PCI Note:  Following the diagnostic procedure, the decision was made to proceed with PCI.  Weight-based bivalirudin was given for anticoagulation. Once a therapeutic ACT was achieved, a 6 [place] XBLAD 3.5 guide catheter was inserted.  A prowater coronary guidewire was used to cross the lesion.  The lesion was  predilated with a 2.5 mm balloon.  With reperfusion there was a long lesion noted in the mid LAD extending from the first diagonal to past the second diagonal.   The lesion was then stented with a 3.5 x 38 mm Promus stent deployed up to 14 atm with good stent expansion.    Following PCI, there was 0% residual stenosis and TIMI-3 flow. Final angiography confirmed an excellent result. The patient tolerated the procedure well and was pain free. There were no immediate procedural complications. A TR band was used for radial hemostasis. The patient was transferred to the post catheterization recovery area for further monitoring.  PCI Data: Vessel - LAD/Segment - mid Percent Stenosis (pre)  100% TIMI-flow 0 Stent 3.5 x 38 mm Promus Percent Stenosis (post) 0% TIMI-flow (post) 3  Final Conclusions:   1. Single vessel occlusive CAD. Culprit lesion in the LAD following the first diagonal. Moderate disease in the LCX and RCA.  2. Moderate LV dysfunction.  Recommendations:  DAPT for one year. Risk factor modification.   Peter Martinique, [place] 04/28/2014, 9:13 PM

## 2014-04-28 NOTE — ED Notes (Signed)
Vital signs stable. 

## 2014-04-28 NOTE — ED Provider Notes (Signed)
Level V caveat, code STEMI, acuity of situationSeen on arrival code STEMI called in the field. Patient complained of anteriorchest pain, radiating to neck and left arm which started last night. Became worse this afternoon. He he was treated by EMS with morphine 6 no grams IV, 4 baby aspirins and 2 sublingual nitroglycerin without relief discomfort prehospital 12-lead EKG consistent with acute anterior wall ST segment elevation myocardial infarction.on exam patient in severe distress. Appears very uncomfortable. Yelling in pain. Skin grossly diaphoretic Cardiology present upon patient's arrival. Patient taken to cardiac cath lab. He received heparin 4000 units intravenously, my order prior to leaving the ED Diagnosis STEMI  Doug SouSam Shantese Raven, MD 04/28/14 2035

## 2014-04-29 ENCOUNTER — Other Ambulatory Visit: Payer: Self-pay

## 2014-04-29 DIAGNOSIS — Z720 Tobacco use: Secondary | ICD-10-CM

## 2014-04-29 DIAGNOSIS — E785 Hyperlipidemia, unspecified: Secondary | ICD-10-CM

## 2014-04-29 DIAGNOSIS — I255 Ischemic cardiomyopathy: Secondary | ICD-10-CM | POA: Diagnosis present

## 2014-04-29 DIAGNOSIS — F1099 Alcohol use, unspecified with unspecified alcohol-induced disorder: Secondary | ICD-10-CM

## 2014-04-29 DIAGNOSIS — I517 Cardiomegaly: Secondary | ICD-10-CM

## 2014-04-29 LAB — BASIC METABOLIC PANEL
Anion gap: 16 — ABNORMAL HIGH (ref 5–15)
BUN: 14 mg/dL (ref 6–23)
CALCIUM: 9 mg/dL (ref 8.4–10.5)
CO2: 20 meq/L (ref 19–32)
Chloride: 103 mEq/L (ref 96–112)
Creatinine, Ser: 0.99 mg/dL (ref 0.50–1.35)
GFR calc Af Amer: >90 mL/min (ref >90)
GFR calc non Af Amer: 87 mL/min — ABNORMAL LOW (ref >90)
GLUCOSE: 99 mg/dL (ref 70–99)
Potassium: 4.3 mEq/L (ref 3.7–5.3)
SODIUM: 139 meq/L (ref 137–147)

## 2014-04-29 LAB — CBC
HCT: 43.6 % (ref 39.0–52.0)
Hemoglobin: 14.5 g/dL (ref 13.0–17.0)
MCH: 32.1 pg (ref 26.0–34.0)
MCHC: 33.3 g/dL (ref 30.0–36.0)
MCV: 96.5 fL (ref 78.0–100.0)
PLATELETS: 268 10*3/uL (ref 150–400)
RBC: 4.52 MIL/uL (ref 4.22–5.81)
RDW: 12.7 % (ref 11.5–15.5)
WBC: 9 10*3/uL (ref 4.0–10.5)

## 2014-04-29 LAB — TROPONIN I: Troponin I: >20 ng/mL (ref <0.30)

## 2014-04-29 LAB — LIPID PANEL
CHOL/HDL RATIO: 5 ratio
CHOLESTEROL: 214 mg/dL — AB (ref 0–200)
HDL: 43 mg/dL (ref >39)
LDL Cholesterol: 138 mg/dL — ABNORMAL HIGH (ref 0–99)
Triglycerides: 164 mg/dL — ABNORMAL HIGH (ref <150)
VLDL: 33 mg/dL (ref 0–40)

## 2014-04-29 MED ORDER — MORPHINE SULFATE 2 MG/ML IJ SOLN
INTRAMUSCULAR | Status: AC
  Filled 2014-04-29: qty 1

## 2014-04-29 MED ORDER — MORPHINE SULFATE 2 MG/ML IJ SOLN
2.0000 mg | INTRAMUSCULAR | Status: DC | PRN
  Administered 2014-04-29: 2 mg via INTRAVENOUS

## 2014-04-29 MED ORDER — MORPHINE SULFATE 2 MG/ML IJ SOLN
2.0000 mg | Freq: Once | INTRAMUSCULAR | Status: AC
Start: 2014-04-29 — End: 2014-04-29
  Administered 2014-04-29: 2 mg via INTRAVENOUS
  Filled 2014-04-29: qty 1

## 2014-04-29 MED ORDER — GI COCKTAIL ~~LOC~~
30.0000 mL | ORAL | Status: AC
  Administered 2014-04-29: 30 mL via ORAL
  Filled 2014-04-29: qty 30

## 2014-04-29 MED ORDER — ISOSORBIDE MONONITRATE 15 MG HALF TABLET
15.0000 mg | ORAL_TABLET | Freq: Every day | ORAL | Status: DC
  Filled 2014-04-29: qty 1

## 2014-04-29 NOTE — Progress Notes (Addendum)
Chest pain is increasing in intensity. Give additional SL nitro now. Check 12 Lead STAT. Can give morphine as well. Will review as to possible need for re-look cath.   Chrystie NoseKenneth C. Hilty, MD, [facility] PartnershipFACC Attending Cardiologist [facility]  Addendum: EKG reviewed, no new ST elevation. Pain now 2-3/10, but appear to be very uncomfortable. Tender over the mid-epigastric area. Give 2 mg morphine and GI Cocktail to see if it provides relief.  Chrystie NoseKenneth C. Hilty, MD, [facility]FACC Attending Cardiologist [facility]

## 2014-04-29 NOTE — Progress Notes (Signed)
DAILY PROGRESS NOTE  Subjective:  Some residual chest pain overnight and this morning, now 1-2/10. Not improved with morphine, thinks tylenol is more helpful. S/P PCI to the LAD yesterday for STEMI with a 3.5 x 38 promus DES.  Objective:  Temp:  [98.2 F (36.8 C)-99 F (37.2 C)] 98.2 F (36.8 C) (11/14 0700) Pulse Rate:  [66-88] 67 (11/14 0700) Resp:  [14-31] 16 (11/14 0700) BP: (94-144)/(81-107) 133/99 mmHg (11/14 0700) SpO2:  [91 %-100 %] 98 % (11/14 0700) Weight:  [211 lb 10.3 oz (96 kg)-217 lb 2.5 oz (98.5 kg)] 217 lb 2.5 oz (98.5 kg) (11/13 2157) Weight change:   Intake/Output from previous day: 11/13 0701 - 11/14 0700 In: 1052.8 [P.O.:360; I.V.:692.8] Out: 900 [Urine:900]  Intake/Output from this shift: Total I/O In: 120 [P.O.:120] Out: -   Medications: Current Facility-Administered Medications  Medication Dose Route Frequency Provider Last Rate Last Dose  . acetaminophen (TYLENOL) tablet 650 mg  650 mg Oral Q4H PRN Raliegh Ip, MD   650 mg at 04/29/14 0748  . aspirin EC tablet 81 mg  81 mg Oral Daily Raliegh Ip, MD   81 mg at 04/29/14 0955  . atorvastatin (LIPITOR) tablet 80 mg  80 mg Oral q1800 Raliegh Ip, MD   80 mg at 04/28/14 2217  . heparin injection 5,000 Units  5,000 Units Subcutaneous 3 times per day Peter M Martinique, MD   5,000 Units at 04/29/14 0617  . levothyroxine (SYNTHROID, LEVOTHROID) tablet 112 mcg  112 mcg Oral QAC breakfast Raliegh Ip, MD   112 mcg at 04/29/14 0955  . metoprolol tartrate (LOPRESSOR) tablet 12.5 mg  12.5 mg Oral BID Raliegh Ip, MD   12.5 mg at 04/29/14 0955  . nitroGLYCERIN (NITROSTAT) SL tablet 0.4 mg  0.4 mg Sublingual Q5 Min x 3 PRN Raliegh Ip, MD      . ondansetron (ZOFRAN) injection 4 mg  4 mg Intravenous Q6H PRN Raliegh Ip, MD      . ticagrelor (BRILINTA) tablet 90 mg  90 mg Oral BID Raliegh Ip, MD   90 mg at 04/29/14 9390    Physical Exam: General appearance: alert and mild distress Neck: no carotid bruit and no  JVD Lungs: clear to auscultation bilaterally Heart: regular rate and rhythm, S1, S2 normal, no murmur, click, rub or gallop Abdomen: soft, non-tender; bowel sounds normal; no masses,  no organomegaly Extremities: extremities normal, atraumatic, no cyanosis or edema Pulses: 2+ and symmetric Skin: Skin color, texture, turgor normal. No rashes or lesions Neurologic: Grossly normal radial cath site - no ecchymosis, good pulse  Lab Results: Results for orders placed or performed during the hospital encounter of 04/28/14 (from the past 48 hour(s))  APTT     Status: Abnormal   Collection Time: 04/28/14  8:22 PM  Result Value Ref Range   aPTT 103 (H) 24 - 37 seconds    Comment:        IF BASELINE aPTT IS ELEVATED, SUGGEST PATIENT RISK ASSESSMENT BE USED TO DETERMINE APPROPRIATE ANTICOAGULANT THERAPY.   CBC     Status: None   Collection Time: 04/28/14  8:22 PM  Result Value Ref Range   WBC 9.6 4.0 - 10.5 K/uL   RBC 4.50 4.22 - 5.81 MIL/uL   Hemoglobin 14.5 13.0 - 17.0 g/dL   HCT 41.5 39.0 - 52.0 %   MCV 92.2 78.0 - 100.0 fL   MCH 32.2 26.0 - 34.0 pg  MCHC 34.9 30.0 - 36.0 g/dL   RDW 12.4 11.5 - 15.5 %   Platelets 299 150 - 400 K/uL  Comprehensive metabolic panel     Status: Abnormal   Collection Time: 04/28/14  8:22 PM  Result Value Ref Range   Sodium 141 137 - 147 mEq/L   Potassium 4.0 3.7 - 5.3 mEq/L   Chloride 103 96 - 112 mEq/L   CO2 15 (L) 19 - 32 mEq/L   Glucose, Bld 112 (H) 70 - 99 mg/dL   BUN 15 6 - 23 mg/dL   Creatinine, Ser 1.08 0.50 - 1.35 mg/dL   Calcium 9.6 8.4 - 10.5 mg/dL   Total Protein 7.4 6.0 - 8.3 g/dL   Albumin 4.0 3.5 - 5.2 g/dL   AST 189 (H) 0 - 37 U/L   ALT 241 (H) 0 - 53 U/L   Alkaline Phosphatase 95 39 - 117 U/L   Total Bilirubin 0.3 0.3 - 1.2 mg/dL   GFR calc non Af Amer 73 (L) >90 mL/min   GFR calc Af Amer 84 (L) >90 mL/min    Comment: (NOTE) The eGFR has been calculated using the CKD EPI equation. This calculation has not been validated in  all clinical situations. eGFR's persistently <90 mL/min signify possible Chronic Kidney Disease.    Anion gap 23 (H) 5 - 15  Protime-INR     Status: None   Collection Time: 04/28/14  8:22 PM  Result Value Ref Range   Prothrombin Time 13.8 11.6 - 15.2 seconds   INR 1.05 0.00 - 1.49  POCT i-Stat troponin I     Status: Abnormal   Collection Time: 04/28/14  8:31 PM  Result Value Ref Range   Troponin i, poc 0.15 (HH) 0.00 - 0.08 ng/mL   Comment NOTIFIED PHYSICIAN    Comment 3            Comment: Due to the release kinetics of cTnI, a negative result within the first hours of the onset of symptoms does not rule out myocardial infarction with certainty. If myocardial infarction is still suspected, repeat the test at appropriate intervals.   I-STAT, chem 8     Status: Abnormal   Collection Time: 04/28/14  8:40 PM  Result Value Ref Range   Sodium 141 137 - 147 mEq/L   Potassium 3.8 3.7 - 5.3 mEq/L   Chloride 111 96 - 112 mEq/L   BUN 16 6 - 23 mg/dL   Creatinine, Ser 1.10 0.50 - 1.35 mg/dL   Glucose, Bld 114 (H) 70 - 99 mg/dL   Calcium, Ion 1.05 (L) 1.13 - 1.30 mmol/L   TCO2 16 0 - 100 mmol/L   Hemoglobin 16.3 13.0 - 17.0 g/dL   HCT 48.0 39.0 - 52.0 %  MRSA PCR Screening     Status: None   Collection Time: 04/28/14  9:41 PM  Result Value Ref Range   MRSA by PCR NEGATIVE NEGATIVE    Comment:        The GeneXpert MRSA Assay (FDA approved for NASAL specimens only), is one component of a comprehensive MRSA colonization surveillance program. It is not intended to diagnose MRSA infection nor to guide or monitor treatment for MRSA infections.   Troponin I (serum)     Status: Abnormal   Collection Time: 04/28/14 11:08 PM  Result Value Ref Range   Troponin I >20.00 (HH) <0.30 ng/mL    Comment:        Due to the release kinetics of  cTnI, a negative result within the first hours of the onset of symptoms does not rule out myocardial infarction with certainty. If myocardial  infarction is still suspected, repeat the test at appropriate intervals. CRITICAL RESULT CALLED TO, READ BACK BY AND VERIFIED WITH: ANINON M,RN 04/28/14 2346 WAYK   Basic metabolic panel     Status: Abnormal   Collection Time: 04/29/14  3:00 AM  Result Value Ref Range   Sodium 139 137 - 147 mEq/L   Potassium 4.3 3.7 - 5.3 mEq/L   Chloride 103 96 - 112 mEq/L   CO2 20 19 - 32 mEq/L   Glucose, Bld 99 70 - 99 mg/dL   BUN 14 6 - 23 mg/dL   Creatinine, Ser 0.99 0.50 - 1.35 mg/dL   Calcium 9.0 8.4 - 10.5 mg/dL   GFR calc non Af Amer 87 (L) >90 mL/min   GFR calc Af Amer >90 >90 mL/min    Comment: (NOTE) The eGFR has been calculated using the CKD EPI equation. This calculation has not been validated in all clinical situations. eGFR's persistently <90 mL/min signify possible Chronic Kidney Disease.    Anion gap 16 (H) 5 - 15  Lipid panel     Status: Abnormal   Collection Time: 04/29/14  3:00 AM  Result Value Ref Range   Cholesterol 214 (H) 0 - 200 mg/dL   Triglycerides 164 (H) <150 mg/dL   HDL 43 >39 mg/dL   Total CHOL/HDL Ratio 5.0 RATIO   VLDL 33 0 - 40 mg/dL   LDL Cholesterol 138 (H) 0 - 99 mg/dL    Comment:        Total Cholesterol/HDL:CHD Risk Coronary Heart Disease Risk Table                     Men   Women  1/2 Average Risk   3.4   3.3  Average Risk       5.0   4.4  2 X Average Risk   9.6   7.1  3 X Average Risk  23.4   11.0        Use the calculated Patient Ratio above and the CHD Risk Table to determine the patient's CHD Risk.        ATP III CLASSIFICATION (LDL):  <100     mg/dL   Optimal  100-129  mg/dL   Near or Above                    Optimal  130-159  mg/dL   Borderline  160-189  mg/dL   High  >190     mg/dL   Very High   CBC     Status: None   Collection Time: 04/29/14  3:00 AM  Result Value Ref Range   WBC 9.0 4.0 - 10.5 K/uL   RBC 4.52 4.22 - 5.81 MIL/uL   Hemoglobin 14.5 13.0 - 17.0 g/dL   HCT 43.6 39.0 - 52.0 %   MCV 96.5 78.0 - 100.0 fL   MCH  32.1 26.0 - 34.0 pg   MCHC 33.3 30.0 - 36.0 g/dL   RDW 12.7 11.5 - 15.5 %   Platelets 268 150 - 400 K/uL  Troponin I (serum)     Status: Abnormal   Collection Time: 04/29/14  3:00 AM  Result Value Ref Range   Troponin I >20.00 (HH) <0.30 ng/mL    Comment:        Due to the release kinetics  of cTnI, a negative result within the first hours of the onset of symptoms does not rule out myocardial infarction with certainty. If myocardial infarction is still suspected, repeat the test at appropriate intervals. CRITICAL VALUE NOTED.  VALUE IS CONSISTENT WITH PREVIOUSLY REPORTED AND CALLED VALUE.    EKG: Resolving anteroseptal STEMI, NSR at 70  Imaging: No results found.  Assessment:  Principal Problem:   ST elevation myocardial infarction (STEMI) involving left anterior descending (LAD) coronary artery in recovery phase Active Problems:   Tobacco abuse   Alcohol use   Cardiomyopathy, ischemic   Plan:  1. LAD STEMI - s/p Promus DES yesterday. Some residual chest discomfort overnight and now. Troponin remains much >20. Will review echocardiogram today. Continue DAPT with ASA, Brillinta. High dose lipitor, lopressor BID. Give sublingual nitro now - will likely start on imdur. Could be small vessel embolization. 2. Ischemic cardiomyopathy - LV dysfunction noted at cath, estimated at 40%, anterior wall dyskinesis. Will review echo today. 3. Dyslipidemia - LDL 138, now on high-dose lipitor 80 mg. 4. Tobacco/etoh abuse - counciled on cessation.  Time Spent Directly with Patient:  15 minutes  Length of Stay:   LOS: 1 day   Pixie Casino, MD, [facility] Attending Cardiologist [facility]  Theresea Trautmann C 04/29/2014, 10:04 AM

## 2014-04-29 NOTE — Progress Notes (Signed)
Patient continues to have chest pain. MD at bedside and requested that cardiac rehab be deferred for today. Will follow up with patient on Monday.

## 2014-04-29 NOTE — Progress Notes (Signed)
  Echocardiogram 2D Echocardiogram has been performed.  Aris EvertsRix, Anayeli Arel A 04/29/2014, 9:58 AM

## 2014-04-30 MED ORDER — IRBESARTAN 75 MG PO TABS
75.0000 mg | ORAL_TABLET | Freq: Every day | ORAL | Status: DC
  Administered 2014-04-30 – 2014-05-01 (×2): 75 mg via ORAL
  Filled 2014-04-30 (×2): qty 1

## 2014-04-30 MED ORDER — BUSPIRONE HCL 10 MG PO TABS
10.0000 mg | ORAL_TABLET | Freq: Three times a day (TID) | ORAL | Status: DC | PRN
  Filled 2014-04-30: qty 1

## 2014-04-30 NOTE — Progress Notes (Signed)
DAILY PROGRESS NOTE  Subjective:  Chest pain has improved, now with some back pain, relieved by tylenol. LVEF has further reduced to 25-30% by echo yesterday with anterior wall motion abnormality, there is grade 2 diastolic dysfunction.   Objective:  Temp:  [98 F (36.7 C)-99.7 F (37.6 C)] 98.2 F (36.8 C) (11/15 0729) Pulse Rate:  [66-88] 76 (11/15 0100) Resp:  [3-27] 14 (11/15 0729) BP: (108-145)/(60-102) 126/102 mmHg (11/15 0729) SpO2:  [95 %-100 %] 99 % (11/15 0729) Weight change:   Intake/Output from previous day: 11/14 0701 - 11/15 0700 In: 620 [P.O.:620] Out: 1975 [Urine:1975]  Intake/Output from this shift: Total I/O In: 240 [P.O.:240] Out: -   Medications: Current Facility-Administered Medications  Medication Dose Route Frequency Provider Last Rate Last Dose  . acetaminophen (TYLENOL) tablet 650 mg  650 mg Oral Q4H PRN Raliegh Ip, MD   650 mg at 04/30/14 0754  . aspirin EC tablet 81 mg  81 mg Oral Daily Raliegh Ip, MD   81 mg at 04/29/14 0955  . atorvastatin (LIPITOR) tablet 80 mg  80 mg Oral q1800 Raliegh Ip, MD   80 mg at 04/29/14 1749  . heparin injection 5,000 Units  5,000 Units Subcutaneous 3 times per day Peter M Martinique, MD   5,000 Units at 04/29/14 2104  . levothyroxine (SYNTHROID, LEVOTHROID) tablet 112 mcg  112 mcg Oral QAC breakfast Raliegh Ip, MD   112 mcg at 04/29/14 0955  . metoprolol tartrate (LOPRESSOR) tablet 12.5 mg  12.5 mg Oral BID Raliegh Ip, MD   12.5 mg at 04/29/14 2104  . morphine 2 MG/ML injection 2 mg  2 mg Intravenous Q2H PRN Pixie Casino, MD   2 mg at 04/29/14 1117  . nitroGLYCERIN (NITROSTAT) SL tablet 0.4 mg  0.4 mg Sublingual Q5 Min x 3 PRN Raliegh Ip, MD   0.4 mg at 04/29/14 1117  . ondansetron (ZOFRAN) injection 4 mg  4 mg Intravenous Q6H PRN Raliegh Ip, MD      . ticagrelor (BRILINTA) tablet 90 mg  90 mg Oral BID Raliegh Ip, MD   90 mg at 04/29/14 2104    Physical Exam: General appearance: alert and mild  distress Neck: no carotid bruit and no JVD Lungs: clear to auscultation bilaterally Heart: regular rate and rhythm, S1, S2 normal, no murmur, click, rub or gallop Abdomen: soft, non-tender; bowel sounds normal; no masses,  no organomegaly Extremities: extremities normal, atraumatic, no cyanosis or edema Pulses: 2+ and symmetric Skin: Skin color, texture, turgor normal. No rashes or lesions Neurologic: Grossly normal radial cath site - no ecchymosis, good pulse  Lab Results: Results for orders placed or performed during the hospital encounter of 04/28/14 (from the past 48 hour(s))  APTT     Status: Abnormal   Collection Time: 04/28/14  8:22 PM  Result Value Ref Range   aPTT 103 (H) 24 - 37 seconds    Comment:        IF BASELINE aPTT IS ELEVATED, SUGGEST PATIENT RISK ASSESSMENT BE USED TO DETERMINE APPROPRIATE ANTICOAGULANT THERAPY.   CBC     Status: None   Collection Time: 04/28/14  8:22 PM  Result Value Ref Range   WBC 9.6 4.0 - 10.5 K/uL   RBC 4.50 4.22 - 5.81 MIL/uL   Hemoglobin 14.5 13.0 - 17.0 g/dL   HCT 41.5 39.0 - 52.0 %   MCV 92.2 78.0 - 100.0 fL   MCH 32.2  26.0 - 34.0 pg   MCHC 34.9 30.0 - 36.0 g/dL   RDW 12.4 11.5 - 15.5 %   Platelets 299 150 - 400 K/uL  Comprehensive metabolic panel     Status: Abnormal   Collection Time: 04/28/14  8:22 PM  Result Value Ref Range   Sodium 141 137 - 147 mEq/L   Potassium 4.0 3.7 - 5.3 mEq/L   Chloride 103 96 - 112 mEq/L   CO2 15 (L) 19 - 32 mEq/L   Glucose, Bld 112 (H) 70 - 99 mg/dL   BUN 15 6 - 23 mg/dL   Creatinine, Ser 1.08 0.50 - 1.35 mg/dL   Calcium 9.6 8.4 - 10.5 mg/dL   Total Protein 7.4 6.0 - 8.3 g/dL   Albumin 4.0 3.5 - 5.2 g/dL   AST 189 (H) 0 - 37 U/L   ALT 241 (H) 0 - 53 U/L   Alkaline Phosphatase 95 39 - 117 U/L   Total Bilirubin 0.3 0.3 - 1.2 mg/dL   GFR calc non Af Amer 73 (L) >90 mL/min   GFR calc Af Amer 84 (L) >90 mL/min    Comment: (NOTE) The eGFR has been calculated using the CKD EPI equation. This  calculation has not been validated in all clinical situations. eGFR's persistently <90 mL/min signify possible Chronic Kidney Disease.    Anion gap 23 (H) 5 - 15  Protime-INR     Status: None   Collection Time: 04/28/14  8:22 PM  Result Value Ref Range   Prothrombin Time 13.8 11.6 - 15.2 seconds   INR 1.05 0.00 - 1.49  POCT i-Stat troponin I     Status: Abnormal   Collection Time: 04/28/14  8:31 PM  Result Value Ref Range   Troponin i, poc 0.15 (HH) 0.00 - 0.08 ng/mL   Comment NOTIFIED PHYSICIAN    Comment 3            Comment: Due to the release kinetics of cTnI, a negative result within the first hours of the onset of symptoms does not rule out myocardial infarction with certainty. If myocardial infarction is still suspected, repeat the test at appropriate intervals.   I-STAT, chem 8     Status: Abnormal   Collection Time: 04/28/14  8:40 PM  Result Value Ref Range   Sodium 141 137 - 147 mEq/L   Potassium 3.8 3.7 - 5.3 mEq/L   Chloride 111 96 - 112 mEq/L   BUN 16 6 - 23 mg/dL   Creatinine, Ser 1.10 0.50 - 1.35 mg/dL   Glucose, Bld 114 (H) 70 - 99 mg/dL   Calcium, Ion 1.05 (L) 1.13 - 1.30 mmol/L   TCO2 16 0 - 100 mmol/L   Hemoglobin 16.3 13.0 - 17.0 g/dL   HCT 48.0 39.0 - 52.0 %  MRSA PCR Screening     Status: None   Collection Time: 04/28/14  9:41 PM  Result Value Ref Range   MRSA by PCR NEGATIVE NEGATIVE    Comment:        The GeneXpert MRSA Assay (FDA approved for NASAL specimens only), is one component of a comprehensive MRSA colonization surveillance program. It is not intended to diagnose MRSA infection nor to guide or monitor treatment for MRSA infections.   Troponin I (serum)     Status: Abnormal   Collection Time: 04/28/14 11:08 PM  Result Value Ref Range   Troponin I >20.00 (HH) <0.30 ng/mL    Comment:  Due to the release kinetics of cTnI, a negative result within the first hours of the onset of symptoms does not rule out myocardial  infarction with certainty. If myocardial infarction is still suspected, repeat the test at appropriate intervals. CRITICAL RESULT CALLED TO, READ BACK BY AND VERIFIED WITH: ANINON M,RN 04/28/14 2346 WAYK   Basic metabolic panel     Status: Abnormal   Collection Time: 04/29/14  3:00 AM  Result Value Ref Range   Sodium 139 137 - 147 mEq/L   Potassium 4.3 3.7 - 5.3 mEq/L   Chloride 103 96 - 112 mEq/L   CO2 20 19 - 32 mEq/L   Glucose, Bld 99 70 - 99 mg/dL   BUN 14 6 - 23 mg/dL   Creatinine, Ser 0.99 0.50 - 1.35 mg/dL   Calcium 9.0 8.4 - 10.5 mg/dL   GFR calc non Af Amer 87 (L) >90 mL/min   GFR calc Af Amer >90 >90 mL/min    Comment: (NOTE) The eGFR has been calculated using the CKD EPI equation. This calculation has not been validated in all clinical situations. eGFR's persistently <90 mL/min signify possible Chronic Kidney Disease.    Anion gap 16 (H) 5 - 15  Lipid panel     Status: Abnormal   Collection Time: 04/29/14  3:00 AM  Result Value Ref Range   Cholesterol 214 (H) 0 - 200 mg/dL   Triglycerides 164 (H) <150 mg/dL   HDL 43 >39 mg/dL   Total CHOL/HDL Ratio 5.0 RATIO   VLDL 33 0 - 40 mg/dL   LDL Cholesterol 138 (H) 0 - 99 mg/dL    Comment:        Total Cholesterol/HDL:CHD Risk Coronary Heart Disease Risk Table                     Men   Women  1/2 Average Risk   3.4   3.3  Average Risk       5.0   4.4  2 X Average Risk   9.6   7.1  3 X Average Risk  23.4   11.0        Use the calculated Patient Ratio above and the CHD Risk Table to determine the patient's CHD Risk.        ATP III CLASSIFICATION (LDL):  <100     mg/dL   Optimal  100-129  mg/dL   Near or Above                    Optimal  130-159  mg/dL   Borderline  160-189  mg/dL   High  >190     mg/dL   Very High   CBC     Status: None   Collection Time: 04/29/14  3:00 AM  Result Value Ref Range   WBC 9.0 4.0 - 10.5 K/uL   RBC 4.52 4.22 - 5.81 MIL/uL   Hemoglobin 14.5 13.0 - 17.0 g/dL   HCT 43.6 39.0 -  52.0 %   MCV 96.5 78.0 - 100.0 fL   MCH 32.1 26.0 - 34.0 pg   MCHC 33.3 30.0 - 36.0 g/dL   RDW 12.7 11.5 - 15.5 %   Platelets 268 150 - 400 K/uL  Troponin I (serum)     Status: Abnormal   Collection Time: 04/29/14  3:00 AM  Result Value Ref Range   Troponin I >20.00 (HH) <0.30 ng/mL    Comment:  Due to the release kinetics of cTnI, a negative result within the first hours of the onset of symptoms does not rule out myocardial infarction with certainty. If myocardial infarction is still suspected, repeat the test at appropriate intervals. CRITICAL VALUE NOTED.  VALUE IS CONSISTENT WITH PREVIOUSLY REPORTED AND CALLED VALUE.   Troponin I (serum)     Status: Abnormal   Collection Time: 04/29/14  9:55 AM  Result Value Ref Range   Troponin I >20.00 (HH) <0.30 ng/mL    Comment:        Due to the release kinetics of cTnI, a negative result within the first hours of the onset of symptoms does not rule out myocardial infarction with certainty. If myocardial infarction is still suspected, repeat the test at appropriate intervals. CRITICAL VALUE NOTED.  VALUE IS CONSISTENT WITH PREVIOUSLY REPORTED AND CALLED VALUE.    EKG: Resolving anteroseptal STEMI, NSR at 70  Imaging: No results found.  Assessment:  Principal Problem:   ST elevation myocardial infarction (STEMI) involving left anterior descending (LAD) coronary artery in recovery phase Active Problems:   Tobacco abuse   Alcohol use   Cardiomyopathy, ischemic   Dyslipidemia   Plan:  1. LAD STEMI - s/p Promus DES yesterday. Chest pain has resolved. Troponin >20. Echocardiogram shows LVEF 20-25%. Continue DAPT with ASA, Brillinta. High dose lipitor, lopressor BID. Ok to start low dose ARB today. 2. Ischemic cardiomyopathy - LV dysfunction noted at cath, estimated at 40%, anterior wall dyskinesis. By echo, EF is much lower at 20-25% with anterior, anteroseptal and apical wall motion abnormalities. On good CHF treatment,  but would add low dose ARB. Appears euvolemic, but will likely need maintenance dose lasix at d/c. Will arrange for LifeVest bridging - I have contacted [facility] with [facility]. 3. Dyslipidemia - LDL 138, now on high-dose lipitor 80 mg. 4. Tobacco/etoh abuse - counciled on cessation. 5. Dispo - transfer to telemetry floor today, d/c pending LifeVest placement, possibly tomorrow.  Time Spent Directly with Patient:  15 minutes  Length of Stay:   LOS: 2 days   Pixie Casino, MD, [facility] Attending Cardiologist [facility]  Shavana Calder C 04/30/2014, 8:29 AM

## 2014-05-01 ENCOUNTER — Inpatient Hospital Stay (HOSPITAL_COMMUNITY): Payer: BC Managed Care – PPO

## 2014-05-01 DIAGNOSIS — I2510 Atherosclerotic heart disease of native coronary artery without angina pectoris: Secondary | ICD-10-CM

## 2014-05-01 DIAGNOSIS — R740 Nonspecific elevation of levels of transaminase and lactic acid dehydrogenase [LDH]: Secondary | ICD-10-CM

## 2014-05-01 DIAGNOSIS — R509 Fever, unspecified: Secondary | ICD-10-CM

## 2014-05-01 DIAGNOSIS — E039 Hypothyroidism, unspecified: Secondary | ICD-10-CM | POA: Insufficient documentation

## 2014-05-01 DIAGNOSIS — R7401 Elevation of levels of liver transaminase levels: Secondary | ICD-10-CM

## 2014-05-01 LAB — TSH: TSH: 0.948 u[IU]/mL (ref 0.350–4.500)

## 2014-05-01 LAB — HEPATIC FUNCTION PANEL
ALBUMIN: 3.6 g/dL (ref 3.5–5.2)
ALT: 117 U/L — AB (ref 0–53)
AST: 148 U/L — AB (ref 0–37)
Alkaline Phosphatase: 79 U/L (ref 39–117)
Bilirubin, Direct: 0.4 mg/dL — ABNORMAL HIGH (ref 0.0–0.3)
Indirect Bilirubin: 1.5 mg/dL — ABNORMAL HIGH (ref 0.3–0.9)
TOTAL PROTEIN: 7.2 g/dL (ref 6.0–8.3)
Total Bilirubin: 1.9 mg/dL — ABNORMAL HIGH (ref 0.3–1.2)

## 2014-05-01 LAB — URINALYSIS, ROUTINE W REFLEX MICROSCOPIC
GLUCOSE, UA: NEGATIVE mg/dL
HGB URINE DIPSTICK: NEGATIVE
Ketones, ur: 15 mg/dL — AB
Nitrite: NEGATIVE
PH: 6 (ref 5.0–8.0)
Protein, ur: 30 mg/dL — AB
SPECIFIC GRAVITY, URINE: 1.029 (ref 1.005–1.030)
Urobilinogen, UA: 1 mg/dL (ref 0.0–1.0)

## 2014-05-01 LAB — BASIC METABOLIC PANEL
ANION GAP: 16 — AB (ref 5–15)
BUN: 15 mg/dL (ref 6–23)
CO2: 20 meq/L (ref 19–32)
Calcium: 9.6 mg/dL (ref 8.4–10.5)
Chloride: 102 mEq/L (ref 96–112)
Creatinine, Ser: 1.04 mg/dL (ref 0.50–1.35)
GFR calc Af Amer: 88 mL/min — ABNORMAL LOW (ref >90)
GFR calc non Af Amer: 76 mL/min — ABNORMAL LOW (ref >90)
Glucose, Bld: 102 mg/dL — ABNORMAL HIGH (ref 70–99)
POTASSIUM: 3.8 meq/L (ref 3.7–5.3)
SODIUM: 138 meq/L (ref 137–147)

## 2014-05-01 LAB — URINE MICROSCOPIC-ADD ON

## 2014-05-01 MED FILL — Sodium Chloride IV Soln 0.9%: INTRAVENOUS | Qty: 50 | Status: AC

## 2014-05-01 NOTE — Progress Notes (Signed)
Heart Failure Navigator Consult Note  Presentation: Randy SniderCarlos N Boylesis a 2M with HLD, hypothyroidism who reported chest discomfort for the past 24 hours. He said that yesterday evening he had some chest discomfort that seemed to him like heartburn, so he took an antacid and went to sleep. It mildly persisted over the course of the day but at about 7:20pm he developed severe substernal crushing chest pain of 10/10 intensity. He called EMS and initial EKG in the field revealed anteroseptal STE. He was started on ASA, heparin, and a nitro gtt.  As a result the cath lab was activated. Angiography revealed prox LAD occlusion, which was treated with a [id number] Promus DES. He is chest pain free after PCI.   Past Medical History  Diagnosis Date  . Thyroid disease   . Arthritis   . Anxiety   . Seizures     20 years ago  . Hypothyroidism     History   Social History  . Marital Status: Single    Spouse Name: N/A    Number of Children: N/A  . Years of Education: N/A   Social History Main Topics  . Smoking status: Former Smoker -- 0.00 packs/day for 42 years    Types: Cigarettes    Quit date: 07/29/2013  . Smokeless tobacco: Former NeurosurgeonUser    Quit date: 02/14/1982  . Alcohol Use: 0.0 oz/week    3-5 Cans of beer per week     Comment: case of beer a week, 3-4 beers a day, 5 years.  . Drug Use: No  . Sexual Activity: Yes   Other Topics Concern  . None   Social History Narrative    ECHO:Study Conclusions  - Left ventricle: The cavity size was normal. Wall thickness was normal. Systolic function was severely reduced. The estimated ejection fraction was in the range of 25% to 30%. Akinesis of the mid-apicalanteroseptal and apical myocardium. Features are consistent with a pseudonormal left ventricular filling pattern, with concomitant abnormal relaxation and increased filling pressure (grade 2 diastolic dysfunction). - Left atrium: The atrium was mildly  dilated.  Transthoracic echocardiography. M-mode, complete 2D, spectral Doppler, and color Doppler. Birthdate: Patient birthdate: 11-20-1955. Age: Patient is 58 yr old. Sex: Gender: male. BMI: 31.1 kg/m^2. Blood pressure:   133/99 Patient status: Inpatient. Study date: Study date: 04/29/2014. Study time: 08:58 AM. Location: ICU/CCU  BNP No results found for: PROBNP  Education Assessment and Provision:  Detailed education and instructions provided on heart failure disease management including the following:  Signs and symptoms of Heart Failure When to call the physician Importance of daily weights Low sodium diet Fluid restriction Medication management Anticipated future follow-up appointments  Patient education given on each of the above topics.  Patient acknowledges understanding and acceptance of all instructions.  I spoke with [name] and his wife regarding his recent MI and HF.  He has already had education with cardiac rehab and is able to teach back many topics listed above.  He does admit to be overwhelmed with all of the  new information.  He does not have a scale and will get one for home use at discharge.  We discussed briefly a low sodium diet and high sodium foods to avoid.  He acknowledges that he has a very stressful, active job.    Education Materials:  "Living Better With Heart Failure" Booklet, Daily Weight Tracker Tool    High Risk Criteria for Readmission and/or Poor Patient Outcomes:  (Recommend Follow-up with Advanced [facility] Failure  Clinic)   EF <30%-yes 25-30% and Grade 2 dias dys.  2 or more admissions in 6 months- No  Difficult social situation- No  Demonstrates medication noncompliance- No   Barriers of Care:  New diagnosis--knowledge  Discharge Planning:   Plans to discharge home with wife

## 2014-05-01 NOTE — Progress Notes (Addendum)
[phone] Cardiac Rehab Pt just back from walk with wife. He denies any cp walking.Completed MI, CHF and stent education with pt. I gave him MI booklet, CHF packet and stent card. They voice understanding. Pt agrees to Outpt. [facility] in [place], will send referral. I discussed smoking cessation with pt. I gave tips for quitting, coaching contact number and quit smart class information. He states he quit in January, but pt smoking a few a day and was still using e cigarette. Pt seems committed to quitting. Beatrix FettersHughes, Rital Cavey G, RN 05/01/2014 11:18 AM

## 2014-05-01 NOTE — Plan of Care (Signed)
Problem: Phase I Progression Outcomes Goal: Initial discharge plan identified Outcome: Completed/Met Date Met:  05/01/14

## 2014-05-01 NOTE — Progress Notes (Signed)
Utilization review completed.  

## 2014-05-01 NOTE — Plan of Care (Signed)
Problem: Phase III Progression Outcomes Goal: Anginal pain absent Outcome: Completed/Met Date Met:  05/01/14 Goal: Up to chair & ambulate with assist (TID) Outcome: Completed/Met Date Met:  05/01/14 Goal: VS Stable with increased activity Outcome: Completed/Met Date Met:  05/01/14 Goal: Vascular site scale level 0 - I Vascular Site Scale Level 0: No bruising/bleeding/hematoma Level I (Mild): Bruising/Ecchymosis, minimal bleeding/ooozing, palpable hematoma < 3 cm Level II (Moderate): Bleeding not affecting hemodynamic parameters, pseudoaneurysm, palpable hematoma > 3 cm Level III (Severe) Bleeding which affects hemodynamic parameters or retroperitoneal hemorrhage  Outcome: Progressing

## 2014-05-01 NOTE — Progress Notes (Signed)
[name] PA made aware of pb 91/56 pt asymptomatic [name], [name] A

## 2014-05-01 NOTE — Progress Notes (Signed)
Pt temp now 99.0 orally after prn Tylenol administration. Will continue to monitor and pt states he's feeling better. Arabella MerlesP. Amo Russ Looper RN.

## 2014-05-01 NOTE — Plan of Care (Signed)
Problem: Phase I Progression Outcomes Goal: Other Phase I Outcomes/Goals Outcome: Not Applicable Date Met:  90/24/09

## 2014-05-01 NOTE — Progress Notes (Signed)
Patient: Randy CottonCarlos N Beasley / Admit Date: 04/28/2014 / Date of Encounter: 05/01/2014, 9:54 AM   Subjective: Febrile this morning to Tmax 101.3. However, he is relatively asymptomatic except for some PND.   Objective: Telemetry: NSR Physical Exam: Blood pressure 104/70, pulse 82, temperature 100.2 F (37.9 C), temperature source Oral, resp. rate 20, height 5\' 10"  (1.778 m), weight 217 lb 2.5 oz (98.5 kg), SpO2 97 %. General: Well developed, well nourished WM, in no acute distress. Head: Normocephalic, atraumatic, sclera non-icteric, no xanthomas, nares are without discharge. Neck: Negative for carotid bruits. JVP not elevated. Lungs: Mildly decreased BS R base. Otherwise clear bilaterally to auscultation without wheezes, rales, or rhonchi. Breathing is unlabored. Heart: RRR S1 S2 without murmurs, rubs, or gallops.  Abdomen: Soft, non-tender, non-distended with normoactive bowel sounds. No rebound/guarding. Extremities: No clubbing or cyanosis. No edema. Distal pedal pulses are 2+ and equal bilaterally. R radial site without complication, hematoma or ecchymosis Neuro: Alert and oriented X 3. Moves all extremities spontaneously. Psych:  Responds to questions appropriately with a normal affect.   Intake/Output Summary (Last 24 hours) at 05/01/14 0954 Last data filed at 05/01/14 0700  Gross per 24 hour  Intake    240 ml  Output      0 ml  Net    240 ml    Inpatient Medications:  . aspirin EC  81 mg Oral Daily  . atorvastatin  80 mg Oral q1800  . heparin  5,000 Units Subcutaneous 3 times per day  . irbesartan  75 mg Oral Daily  . levothyroxine  112 mcg Oral QAC breakfast  . metoprolol tartrate  12.5 mg Oral BID  . ticagrelor  90 mg Oral BID   Infusions:    Labs:  Recent Labs  04/29/14 0300 05/01/14 0335  NA 139 138  K 4.3 3.8  CL 103 102  CO2 20 20  GLUCOSE 99 102*  BUN 14 15  CREATININE 0.99 1.04  CALCIUM 9.0 9.6    Recent Labs  04/28/14 2022  AST 189*  ALT 241*   ALKPHOS 95  BILITOT 0.3  PROT 7.4  ALBUMIN 4.0    Recent Labs  04/28/14 2022 04/28/14 2040 04/29/14 0300  WBC 9.6  --  9.0  HGB 14.5 16.3 14.5  HCT 41.5 48.0 43.6  MCV 92.2  --  96.5  PLT 299  --  268    Recent Labs  04/28/14 2308 04/29/14 0300 04/29/14 0955  TROPONINI >20.00* >20.00* >20.00*   Invalid input(s): POCBNP No results for input(s): HGBA1C in the last 72 hours.   Radiology/Studies:  No results found.   Assessment and Plan  1. CAD with anteroseptal STEMI s/p DES to LAD 04/28/14 - continue DAPT with aspirin, Brilinta, statin, beta blocker. 2. Ischemic cardiomyopathy EF 40% by cath, EF 25-30% by echo 04/29/14, grade 2 d/d - CHF regimen initiated this admission with ARB, low dose beta blocker. He is on metoprolol. I considered changing to Coreg but BP is on the softer side so would not rock the boat right now. Tresa EndoKelly came and fitted him with the Lifevest this AM so that is all set. Based on CXR results (see below) may need maintenance Lasix if BP will tolerate. 3. Dyslipidemia - LDL 138, now on high-dose lipitor 80 mg. LFTs abnormal on 11/13, maybe due to MI. Recheck today. 4. Transamintis - recheck today. 5. Tobacco/EtOH abuse - counciled on cessation. 6. Hypothyroidism - will see if possible to add on baseline TSH to  prior labs. 7. Fever - check CXR, UA. ? R/t MI. No symptoms except PND and feeling tired.   Signed, [name] PA-C The patient is doing well in terms of his ischemic heart disease.  Tolerating exercise.  He has been fitted for life vest. He is running low grade fever etiology undetermined.  Liver function studies are elevated.  Chest x-ray has been done and results are pending.  Agree with repeating his white count and liver function studies tomorrow to be sure that they are trending down.  Previous alcohol intake may also be playing a role in his elevated LFTs.  Possibly home tomorrow if stable

## 2014-05-01 NOTE — Care Management Note (Signed)
    Page 1 of 1   05/01/2014     2:15:41 PM CARE MANAGEMENT NOTE 05/01/2014  Patient:  Randy Beasley,Randy Beasley   Account Number:  [id number]  Date Initiated:  05/01/2014  Documentation initiated by:  Donn PieriniWEBSTER,Chea Malan  Subjective/Objective Assessment:   Pt admitted with STEMI     Action/Plan:   PTA pt lived at home- plan to return home   Anticipated DC Date:  05/02/2014   Anticipated DC Plan:  HOME/SELF CARE      DC Planning Services  CM consult  Medication Assistance      Choice offered to / List presented to:             Status of service:  Completed, signed off Medicare Important Message given?  NO (If response is "NO", the following Medicare IM given date fields will be blank) Date Medicare IM given:   Medicare IM given by:   Date Additional Medicare IM given:   Additional Medicare IM given by:    Discharge Disposition:  HOME/SELF CARE  Per UR Regulation:  Reviewed for med. necessity/level of care/duration of stay  If discussed at Long Length of Stay Meetings, dates discussed:    Comments:  05/01/14- 1400- Donn PieriniKristi Kendelle Schweers RN BSN [phone] Referral for Brilinta- benefits check  completed- PT COPAY WILL BE $50- PRIOR AUTH NOT REQUIRED- spoke with pt and informed of information of Brilintal coverage- pt has been given Brilinta cards and has been fitted with life vest (has at bedside).

## 2014-05-02 LAB — COMPREHENSIVE METABOLIC PANEL
ALBUMIN: 3.6 g/dL (ref 3.5–5.2)
ALT: 99 U/L — ABNORMAL HIGH (ref 0–53)
ANION GAP: 14 (ref 5–15)
AST: 102 U/L — ABNORMAL HIGH (ref 0–37)
Alkaline Phosphatase: 86 U/L (ref 39–117)
BUN: 23 mg/dL (ref 6–23)
CO2: 24 mEq/L (ref 19–32)
Calcium: 9.3 mg/dL (ref 8.4–10.5)
Chloride: 99 mEq/L (ref 96–112)
Creatinine, Ser: 1.27 mg/dL (ref 0.50–1.35)
GFR calc Af Amer: 69 mL/min — ABNORMAL LOW (ref >90)
GFR calc non Af Amer: 60 mL/min — ABNORMAL LOW (ref >90)
Glucose, Bld: 98 mg/dL (ref 70–99)
POTASSIUM: 4.2 meq/L (ref 3.7–5.3)
Sodium: 137 mEq/L (ref 137–147)
TOTAL PROTEIN: 7 g/dL (ref 6.0–8.3)
Total Bilirubin: 1.7 mg/dL — ABNORMAL HIGH (ref 0.3–1.2)

## 2014-05-02 LAB — CBC
HEMATOCRIT: 39.9 % (ref 39.0–52.0)
HEMOGLOBIN: 13.4 g/dL (ref 13.0–17.0)
MCH: 31.8 pg (ref 26.0–34.0)
MCHC: 33.6 g/dL (ref 30.0–36.0)
MCV: 94.5 fL (ref 78.0–100.0)
Platelets: 213 10*3/uL (ref 150–400)
RBC: 4.22 MIL/uL (ref 4.22–5.81)
RDW: 12.6 % (ref 11.5–15.5)
WBC: 10.8 10*3/uL — AB (ref 4.0–10.5)

## 2014-05-02 LAB — POCT ACTIVATED CLOTTING TIME: Activated Clotting Time: 467 seconds

## 2014-05-02 MED ORDER — TICAGRELOR 90 MG PO TABS: 90.0000 mg | ORAL_TABLET | Freq: Two times a day (BID) | ORAL | Status: DC

## 2014-05-02 MED ORDER — ATORVASTATIN CALCIUM 80 MG PO TABS: 80.0000 mg | ORAL_TABLET | Freq: Every day | ORAL | Status: DC

## 2014-05-02 MED ORDER — METOPROLOL TARTRATE 25 MG PO TABS: 12.5000 mg | ORAL_TABLET | Freq: Two times a day (BID) | ORAL | Status: DC

## 2014-05-02 MED ORDER — NITROGLYCERIN 0.4 MG SL SUBL: 0.4000 mg | SUBLINGUAL_TABLET | SUBLINGUAL | Status: DC | PRN

## 2014-05-02 NOTE — Progress Notes (Signed)
Patient resting in bed on his side, complaint of some sob when lying on his back, room air sat 100%, temp 100.3 Randy Beasley, Randy Beasley

## 2014-05-02 NOTE — Progress Notes (Signed)
Subjective: Ambulated this morning without difficulties.  No chills.  Has had some SOB but no orthopnea/LEE  Objective: Vital signs in last 24 hours: Temp:  [98 F (36.7 C)-100.3 F (37.9 C)] 98.9 F (37.2 C) (11/17 1033) Pulse Rate:  [77-81] 78 (11/17 0440) Resp:  [18-20] 18 (11/17 1033) BP: (85-107)/(56-76) 107/76 mmHg (11/17 1033) SpO2:  [97 %-100 %] 100 % (11/17 0440) Weight:  [213 lb 6.5 oz (96.8 kg)] 213 lb 6.5 oz (96.8 kg) (11/17 0500) Last BM Date: 05/01/14  Intake/Output from previous day: 11/16 0701 - 11/17 0700 In: 360 [P.O.:360] Out: -  Intake/Output this shift: Total I/O In: 120 [P.O.:120] Out: -   Medications Current Facility-Administered Medications  Medication Dose Route Frequency Provider Last Rate Last Dose  . acetaminophen (TYLENOL) tablet 650 mg  650 mg Oral Q4H PRN Sammuel HinesAmit N Vora, MD   650 mg at 05/01/14 1615  . aspirin EC tablet 81 mg  81 mg Oral Daily Sammuel HinesAmit N Vora, MD   81 mg at 05/02/14 1045  . atorvastatin (LIPITOR) tablet 80 mg  80 mg Oral q1800 Sammuel HinesAmit N Vora, MD   80 mg at 05/01/14 1853  . busPIRone (BUSPAR) tablet 10 mg  10 mg Oral TID PRN Chrystie NoseKenneth C. Hilty, MD      . heparin injection 5,000 Units  5,000 Units Subcutaneous 3 times per day Peter M SwazilandJordan, MD   5,000 Units at 05/02/14 [phone]  . levothyroxine (SYNTHROID, LEVOTHROID) tablet 112 mcg  112 mcg Oral QAC breakfast Sammuel HinesAmit N Vora, MD   112 mcg at 05/02/14 [id number]  . metoprolol tartrate (LOPRESSOR) tablet 12.5 mg  12.5 mg Oral BID Sammuel HinesAmit N Vora, MD   12.5 mg at 05/02/14 1045  . morphine 2 MG/ML injection 2 mg  2 mg Intravenous Q2H PRN Chrystie NoseKenneth C. Hilty, MD   2 mg at 04/29/14 1117  . nitroGLYCERIN (NITROSTAT) SL tablet 0.4 mg  0.4 mg Sublingual Q5 Min x 3 PRN Sammuel HinesAmit N Vora, MD   0.4 mg at 04/29/14 1117  . ondansetron (ZOFRAN) injection 4 mg  4 mg Intravenous Q6H PRN Sammuel HinesAmit N Vora, MD      . ticagrelor (BRILINTA) tablet 90 mg  90 mg Oral BID Sammuel HinesAmit N Vora, MD   90 mg at 05/02/14 1045    PE: General appearance:  alert, cooperative and no distress Lungs: clear to auscultation bilaterally Heart: regular rate and rhythm, S1, S2 normal, no murmur, click, rub or gallop Extremities: No LEE Pulses: 2+ and symmetric Skin: Warm and dry.  Mild ecchymosis at right radial cath site.  Neurologic: Grossly normal  Lab Results:   Recent Labs  05/02/14 0453  WBC 10.8*  HGB 13.4  HCT 39.9  PLT 213   BMET  Recent Labs  05/01/14 0335 05/02/14 0453  NA 138 137  K 3.8 4.2  CL 102 99  CO2 20 24  GLUCOSE 102* 98  BUN 15 23  CREATININE 1.04 1.27  CALCIUM 9.6 9.3    Assessment/Plan   Principal Problem:   ST elevation myocardial infarction (STEMI) involving left anterior descending (LAD) coronary artery in recovery phase Active Problems:   Tobacco abuse   Alcohol use   Cardiomyopathy, ischemic   Dyslipidemia   CAD (coronary artery disease)   Transaminitis   Hypothyroidism   Fever  1. CAD with anteroseptal STEMI  s/p DES to LAD 04/28/14 - continue DAPT with aspirin, Brilinta, statin, beta blocker.  SOB could be related to Brilinta.  2. Ischemic cardiomyopathy  EF 40% by cath, EF 25-30% by echo 04/29/14, grade 2 d/d - CHF regimen initiated this admission with ARB, low dose beta blocker. He is on metoprolol.   Lifevest fitted.   Appears euvolemic.  He received instructions on weight monitoring, etc.. From Adv CHF.   3. Dyslipidemia -  LDL 138, now on high-dose lipitor 80 mg. LFTs abnormal on 11/13 4. Transamintis -  Trending down.   Recheck in a couple weeks.   5. Tobacco/EtOH abuse -   counciled on cessation. 6. Hypothyroidism - .  TSH 0.948 7. Fever   Temp at 0400hrs 100.3.   Last was 98.9.   WBCs 9.0 >>10.8 today.  UA negative for infection.  No cough, chills.  I think he is stable for DC home.       LOS: 4 days    HAGER, BRYAN PA-C 05/02/2014 11:37 AM  Fever is improving and may have just been secondary to the MI itself. No other symptoms. Liver function tests have improved  significantly. I spoke with him about abstaining from alcohol after discharge. Lungs are clear. Heart no gallop.  Okay for discharge today. Has LifeVest.

## 2014-05-02 NOTE — Discharge Summary (Signed)
Physician Discharge Summary     Cardiologist:  SwazilandJordan  Patient ID: Randy Beasley MRN: [id number] DOB/AGE: 07/13/1955 58 y.o.  Admit date: 04/28/2014 Discharge date: 05/02/2014  Admission Diagnoses:  STEMI  Discharge Diagnoses:  Principal Problem:   ST elevation myocardial infarction (STEMI) involving left anterior descending (LAD) coronary artery in recovery phase Active Problems:   Tobacco abuse   Alcohol use   Cardiomyopathy, ischemic   Dyslipidemia   CAD (coronary artery disease)   Transaminitis   Hypothyroidism   Fever   Discharged Condition: stable  Hospital Course:   58 yo male with a history of HLD, hypothyroidism who reported stuttering chest discomfort for the past 24 hours. He said that yesterday evening he had some chest discomfort that seemed to him like heartburn, so he took an antacid and went to sleep. It mildly persisted over the course of the day but at about 7:20pm he developed severe substernal crushing chest pain of 10/10 intensity. He called EMS and initial EKG in the field revealed anteroseptal STE. He was started on ASA, heparin, and a nitro gtt.  The patient was taken emergently to the cath lab.  The procedure revealed prox LAD occlusion, which was treated with a 3.5x4738mm Promus DES. He was chest pain free after PCI.  He was continued on ASA.  Brilinta added with lopressor, high dose statin.  2D echo revealed an EF of 25-30%.  He was fit and instructed on the use of a [employer] Lifevest.  The Adv CHF navigator was consulted and reviewed CHF monitoring and dietary restrictions with the patient.  He was counseled on tobacco and ETOH cessation.  He ambulated with cardiac rehab without difficulties.  LFTs are trending down.  Recommend follow up lab draw in the office, especially since he is now on high dose statin.  The patient was seen by Dr. Patty SermonsBrackbill who felt he was stable for DC home.      Consults: Advanced CHF  Significant Diagnostic Studies:   Echo            Study Conclusions  - Left ventricle: The cavity size was normal. Wall thickness was normal. Systolic function was severely reduced. The estimated ejection fraction was in the range of 25% to 30%. Akinesis of the mid-apicalanteroseptal and apical myocardium. Features are consistent with a pseudonormal left ventricular filling pattern, with concomitant abnormal relaxation and increased filling pressure (grade 2 diastolic dysfunction). - Left atrium: The atrium was mildly dilated.  Treatments:   Discharge Exam: Blood pressure 107/76, pulse 78, temperature 98.9 F (37.2 C), temperature source Oral, resp. rate 18, height 5\' 10"  (1.778 m), weight 213 lb 6.5 oz (96.8 kg), SpO2 100 %.   Disposition: 01-Home or Self Care      Discharge Instructions    Amb Referral to Cardiac Rehabilitation    Complete by:  As directed      Diet - low sodium heart healthy    Complete by:  As directed      Discharge instructions    Complete by:  As directed   Monitor your weight every morning.  If you gain 3 pounds in 24 hours, or 5 pounds in a week, call the office for instructions.     Increase activity slowly    Complete by:  As directed             Medication List    STOP taking these medications        ALEVE 220 MG tablet  Generic  drug:  naproxen sodium      TAKE these medications        aspirin 81 MG tablet  Take 81 mg by mouth daily.     atorvastatin 80 MG tablet  Commonly known as:  LIPITOR  Take 1 tablet (80 mg total) by mouth daily at 6 PM.     busPIRone 10 MG tablet  Commonly known as:  BUSPAR  Take 10 mg by mouth 3 (three) times daily as needed. Anxiety     levothyroxine 112 MCG tablet  Commonly known as:  SYNTHROID, LEVOTHROID  Take 112 mcg by mouth daily.     metoprolol tartrate 25 MG tablet  Commonly known as:  LOPRESSOR  Take 0.5 tablets (12.5 mg total) by mouth 2 (two) times daily.     multivitamin with minerals Tabs tablet  Take 1  tablet by mouth daily.     nitroGLYCERIN 0.4 MG SL tablet  Commonly known as:  NITROSTAT  Place 1 tablet (0.4 mg total) under the tongue every 5 (five) minutes x 3 doses as needed for chest pain.     ticagrelor 90 MG Tabs tablet  Commonly known as:  BRILINTA  Take 1 tablet (90 mg total) by mouth 2 (two) times daily.       Follow-up Information    [facility]2014.   Specialty:  Cardiology   Why:  10 AM   Contact information:   [address] [place] [address] [phone]      Greater than 30 minutes was spent completing the patient's discharge.    SignedWilburt Finlay: Billey Wojciak, PAC 05/02/2014, 1:32 PM

## 2014-05-02 NOTE — Progress Notes (Signed)
Reviewed discharge instructions with patient and wife, questions answered, denies discomfort. Dc home with instructions and prescriptions. Life vest applied Randy Beasley, Kimberly A

## 2014-05-05 ENCOUNTER — Telehealth: Payer: Self-pay | Admitting: Cardiology

## 2014-05-05 NOTE — Telephone Encounter (Signed)
11.20.15  Patient brought [facility]--Attending Physician Statement for Dr SwazilandJordan to office.  Completed ROI form, will bring check for payment to [facility] back.  Will forward to [facility] upon receipt of payment.  lp

## 2014-05-08 NOTE — Telephone Encounter (Signed)
11.20.15  Patient brought check in.  [facility] 05/05/14. lp

## 2014-05-15 ENCOUNTER — Ambulatory Visit (INDEPENDENT_AMBULATORY_CARE_PROVIDER_SITE_OTHER): Payer: BC Managed Care – PPO | Admitting: Cardiology

## 2014-05-15 ENCOUNTER — Encounter: Payer: Self-pay | Admitting: Cardiology

## 2014-05-15 VITALS — BP 110/80 | HR 78 | Ht 70.0 in | Wt 217.0 lb

## 2014-05-15 DIAGNOSIS — I2510 Atherosclerotic heart disease of native coronary artery without angina pectoris: Secondary | ICD-10-CM

## 2014-05-15 DIAGNOSIS — R7989 Other specified abnormal findings of blood chemistry: Secondary | ICD-10-CM

## 2014-05-15 DIAGNOSIS — I509 Heart failure, unspecified: Secondary | ICD-10-CM

## 2014-05-15 DIAGNOSIS — I255 Ischemic cardiomyopathy: Secondary | ICD-10-CM

## 2014-05-15 DIAGNOSIS — R7401 Elevation of levels of liver transaminase levels: Secondary | ICD-10-CM

## 2014-05-15 DIAGNOSIS — Z789 Other specified health status: Secondary | ICD-10-CM

## 2014-05-15 DIAGNOSIS — R740 Nonspecific elevation of levels of transaminase and lactic acid dehydrogenase [LDH]: Secondary | ICD-10-CM

## 2014-05-15 DIAGNOSIS — Z9189 Other specified personal risk factors, not elsewhere classified: Secondary | ICD-10-CM | POA: Insufficient documentation

## 2014-05-15 DIAGNOSIS — Z7289 Other problems related to lifestyle: Secondary | ICD-10-CM

## 2014-05-15 DIAGNOSIS — R945 Abnormal results of liver function studies: Secondary | ICD-10-CM

## 2014-05-15 DIAGNOSIS — I2102 ST elevation (STEMI) myocardial infarction involving left anterior descending coronary artery: Secondary | ICD-10-CM

## 2014-05-15 DIAGNOSIS — F1099 Alcohol use, unspecified with unspecified alcohol-induced disorder: Secondary | ICD-10-CM

## 2014-05-15 MED ORDER — RAMIPRIL 2.5 MG PO CAPS: 2.5000 mg | ORAL_CAPSULE | Freq: Every day | ORAL | Status: DC

## 2014-05-15 NOTE — Assessment & Plan Note (Signed)
Plan to titrate meds and recheck echo in 3 months, if EF continues to be low then would need ICD.  Will add altace 2.5 mg daily, will check labs today.

## 2014-05-15 NOTE — Progress Notes (Signed)
05/15/2014   PCP: Olivia CanterKELLY, WILLIAM, MD   Chief Complaint  Patient presents with  . Follow-up    post hospital, wearing Lifevest, SOB since leaving hospital.     Primary Cardiologist: Dr. P. SwazilandJordan   HPI:  58 yo male with a history of HLD, hypothyroidism who reported stuttering chest discomfort 04/28/14 then he developed severe substernal crushing chest pain of 10/10 intensity. He called EMS and initial EKG in the field revealed anteroseptal STE. He was started on ASA, heparin, and a nitro gtt. Emergent cath with findings of 100% LAD.  He underwent PCI with Promus DES,  He has residual disease in LCX and RCA that will be treated medically.  His EF was 40% at cath. Troponin pk >20. Echo with EF 25-30%.  Pt discharged with life vest.  HF team did talk with pt.  We could not start ACE due to  Soft BP.  He was placed on statin but will need to follow LFTs these were elevated post MI.  Pt did drink alcohol and has pretty much stopped he has had rare wine.  We discussed that alcohol at this time was toxic for his heart.    He is back today for follow up.  He does not work due to disability.  No chest pain, he does have SOB usually at night but is then able to go back to sleep.  Thought to be Brilinta in the hospital.  He did not have last night..  He has been weighing daily and weight is stable. He is watching his salt intake as well.   He had stopped smoking in Feb.   No Known Allergies  Current Outpatient Prescriptions  Medication Sig Dispense Refill  . aspirin 81 MG tablet Take 81 mg by mouth daily.    Marland Kitchen. atorvastatin (LIPITOR) 80 MG tablet Take 1 tablet (80 mg total) by mouth daily at 6 PM. 30 tablet 5  . busPIRone (BUSPAR) 10 MG tablet Take 10 mg by mouth 3 (three) times daily as needed. Anxiety  10  . levothyroxine (SYNTHROID, LEVOTHROID) 112 MCG tablet Take 112 mcg by mouth daily.    . metoprolol tartrate (LOPRESSOR) 25 MG tablet Take 0.5 tablets (12.5 mg total) by mouth 2  (two) times daily. 60 tablet 5  . Multiple Vitamin (MULTIVITAMIN WITH MINERALS) TABS Take 1 tablet by mouth daily.    . nitroGLYCERIN (NITROSTAT) 0.4 MG SL tablet Place 1 tablet (0.4 mg total) under the tongue every 5 (five) minutes x 3 doses as needed for chest pain. 25 tablet 12  . ticagrelor (BRILINTA) 90 MG TABS tablet Take 1 tablet (90 mg total) by mouth 2 (two) times daily. 60 tablet 0  . ramipril (ALTACE) 2.5 MG capsule Take 1 capsule (2.5 mg total) by mouth daily. 90 capsule 3   No current facility-administered medications for this visit.    Past Medical History  Diagnosis Date  . Thyroid disease   . Arthritis   . Anxiety   . Seizures     20 years ago  . Hypothyroidism   . STEMI (ST elevation myocardial infarction) 04/28/14    with stent to LAD  . CAD (coronary artery disease)     residual disease to LCX and RCA  . Ischemic cardiomyopathy     EF 25-30%    Past Surgical History  Procedure Laterality Date  . Neck surgery    . Back surgery    . Appendectomy    .  Tonsillectomy    . Coronary angioplasty with stent placement  04/28/14    Promus DES to LAD    NWG:[id number]:[id number]:General:no colds or fevers, no weight changes Skin:no rashes or ulcers HEENT:no blurred vision, no congestion CV:see HPI PUL:see HPI GI:no diarrhea constipation or melena, no indigestion GU:no hematuria, no dysuria MS:no joint pain, no claudication Neuro:no syncope, no lightheadedness Endo:no diabetes, no thyroid disease  Wt Readings from Last 3 Encounters:  05/15/14 217 lb (98.431 kg)  05/02/14 213 lb 6.5 oz (96.8 kg)  02/15/12 205 lb (92.987 kg)    PHYSICAL EXAM BP 110/80 mmHg  Pulse 78  Ht 5\' 10"  (1.778 m)  Wt 217 lb (98.431 kg)  BMI 31.14 kg/m2 General:Pleasant affect, NAD Skin:Warm and dry, brisk capillary refill HEENT:normocephalic, sclera clear, mucus membranes moist Neck:supple, no JVD, no bruits  Heart:S1S2 RRR without murmur, gallup, rub or click Lungs:clear without rales, rhonchi, or  wheezes VHQ:IONGAbd:soft, non tender, + BS, do not palpate liver spleen or masses Ext:no lower ext edema, 2+ pedal pulses, 2+ radial pulses Neuro:alert and oriented, MAE, follows commands, + facial symmetry EKG: SR rate of 78, deep t wave inversions V2-V4, changes post acute MI.  ASSESSMENT AND PLAN ST elevation myocardial infarction (STEMI) involving left anterior descending (LAD) coronary artery in recovery phase No chest pain  Cardiomyopathy, ischemic Plan to titrate meds and recheck echo in 3 months, if EF continues to be low then would need ICD.  Will add altace 2.5 mg daily, will check labs today.  CAD (coronary artery disease) Residual CAD. No pain, but treating medically.    Transaminitis May be related to MI, but on statin will recheck LFTs.  Alcohol use Rare alcohol intake now.  At risk for sudden cardiac death, now with lifevest See note above, wearing life vest.   Pt will follow up with Dr. P. SwazilandJordan in 6 weeks.

## 2014-05-15 NOTE — Assessment & Plan Note (Signed)
See note above, wearing life vest.

## 2014-05-15 NOTE — Assessment & Plan Note (Signed)
Rare alcohol intake now.

## 2014-05-15 NOTE — Assessment & Plan Note (Signed)
May be related to MI, but on statin will recheck LFTs.

## 2014-05-15 NOTE — Patient Instructions (Signed)
Your physician recommends that you schedule a follow-up appointment in:2 weeks with Nada BoozerLaura Ingold NP, or another available extender in our office.  Your physician recommends that you schedule a follow-up appointment with [name] in January

## 2014-05-15 NOTE — Assessment & Plan Note (Signed)
Residual CAD. No pain, but treating medically.

## 2014-05-15 NOTE — Assessment & Plan Note (Signed)
No chest pain

## 2014-05-16 ENCOUNTER — Other Ambulatory Visit: Payer: Self-pay | Admitting: *Deleted

## 2014-05-16 DIAGNOSIS — R945 Abnormal results of liver function studies: Secondary | ICD-10-CM

## 2014-05-16 DIAGNOSIS — Z79899 Other long term (current) drug therapy: Secondary | ICD-10-CM

## 2014-05-16 DIAGNOSIS — R7989 Other specified abnormal findings of blood chemistry: Secondary | ICD-10-CM

## 2014-05-16 LAB — COMPREHENSIVE METABOLIC PANEL
ALT: 167 U/L — ABNORMAL HIGH (ref 0–53)
AST: 119 U/L — ABNORMAL HIGH (ref 0–37)
Albumin: 4.3 g/dL (ref 3.5–5.2)
Alkaline Phosphatase: 104 U/L (ref 39–117)
BUN: 18 mg/dL (ref 6–23)
CALCIUM: 9.7 mg/dL (ref 8.4–10.5)
CO2: 24 meq/L (ref 19–32)
Chloride: 106 mEq/L (ref 96–112)
Creat: 1.16 mg/dL (ref 0.50–1.35)
Glucose, Bld: 70 mg/dL (ref 70–99)
Potassium: 4.7 mEq/L (ref 3.5–5.3)
SODIUM: 140 meq/L (ref 135–145)
TOTAL PROTEIN: 7 g/dL (ref 6.0–8.3)
Total Bilirubin: 1 mg/dL (ref 0.2–1.2)

## 2014-05-16 MED ORDER — ATORVASTATIN CALCIUM 10 MG PO TABS: 10.0000 mg | ORAL_TABLET | Freq: Every day | ORAL | Status: DC

## 2014-05-16 NOTE — Telephone Encounter (Signed)
Lipitor 10mg  sent to patient pharmacy per Lady SaucierLaura Ingold-NP, orders put in for additional LFT's in 2 weeks.

## 2014-05-18 NOTE — Telephone Encounter (Signed)
12.2.15 Received Attending Physician [facility].   12.3.15 Given to C. Pugh RN for Dr SwazilandJordan to have him review and sign. lp

## 2014-05-18 NOTE — Telephone Encounter (Signed)
[name] signed form.Form returned to [employer].

## 2014-05-18 NOTE — Telephone Encounter (Signed)
12.3.15 Received completed and signed Physician Statement from Dr SwazilandJordan.  Faxed to [facility].  Notified patient that form has been faxed and was mailing him the original.  lp

## 2014-05-25 ENCOUNTER — Encounter (HOSPITAL_COMMUNITY): Payer: Self-pay | Admitting: Cardiology

## 2014-05-26 ENCOUNTER — Other Ambulatory Visit: Payer: Self-pay | Admitting: *Deleted

## 2014-05-26 MED ORDER — TICAGRELOR 90 MG PO TABS: 90.0000 mg | ORAL_TABLET | Freq: Two times a day (BID) | ORAL | Status: DC

## 2014-05-29 ENCOUNTER — Ambulatory Visit (INDEPENDENT_AMBULATORY_CARE_PROVIDER_SITE_OTHER): Payer: BC Managed Care – PPO | Admitting: Cardiology

## 2014-05-29 ENCOUNTER — Encounter: Payer: Self-pay | Admitting: Cardiology

## 2014-05-29 VITALS — BP 100/84 | HR 60 | Ht 71.0 in | Wt 217.8 lb

## 2014-05-29 DIAGNOSIS — R7401 Elevation of levels of liver transaminase levels: Secondary | ICD-10-CM

## 2014-05-29 DIAGNOSIS — R740 Nonspecific elevation of levels of transaminase and lactic acid dehydrogenase [LDH]: Secondary | ICD-10-CM

## 2014-05-29 DIAGNOSIS — I255 Ischemic cardiomyopathy: Secondary | ICD-10-CM

## 2014-05-29 DIAGNOSIS — Z9189 Other specified personal risk factors, not elsewhere classified: Secondary | ICD-10-CM

## 2014-05-29 DIAGNOSIS — I2102 ST elevation (STEMI) myocardial infarction involving left anterior descending coronary artery: Secondary | ICD-10-CM

## 2014-05-29 LAB — HEPATIC FUNCTION PANEL
ALBUMIN: 4.4 g/dL (ref 3.5–5.2)
ALT: 195 U/L — AB (ref 0–53)
AST: 143 U/L — ABNORMAL HIGH (ref 0–37)
Alkaline Phosphatase: 96 U/L (ref 39–117)
BILIRUBIN INDIRECT: 1 mg/dL (ref 0.2–1.2)
Bilirubin, Direct: 0.3 mg/dL (ref 0.0–0.3)
TOTAL PROTEIN: 7.2 g/dL (ref 6.0–8.3)
Total Bilirubin: 1.3 mg/dL — ABNORMAL HIGH (ref 0.2–1.2)

## 2014-05-29 NOTE — Assessment & Plan Note (Signed)
Blood pressure is borderline pulses borderline unable to increase ace or beta blocker today.

## 2014-05-29 NOTE — Patient Instructions (Signed)
Continue same medications    Keep appointment with [name] 06/29/14 at 10:00 am

## 2014-05-29 NOTE — Assessment & Plan Note (Signed)
Continue to wear his LifeVest he would need a follow-up echo in February to evaluate need for ICD.

## 2014-05-29 NOTE — Assessment & Plan Note (Signed)
If LFTs continue to be elevated we'll discuss with Dr. SwazilandJordan for further direction.

## 2014-05-29 NOTE — Progress Notes (Signed)
05/29/2014   PCP: Olivia CanterKELLY, WILLIAM, MD   Chief Complaint  Patient presents with  . Follow-up    pt denies chest pain, and swelling. pt states that he does experience sob from time to time.    Primary Cardiologist:Dr. P. SwazilandJordan   HPI: pt back for follow up from last week to titrate his meds up secondary to his ischemic cardiomyopathy.   Recent STEMI  undergoing PCI with Promus drug-eluting stent.  All taste 2.5 mg was added to his medical regimen on last visit. He is felt well with this addition. We checked his LFTs on the last visit and they were more elevated than they had been I decreased his Lipitor from 80-10 mg. He just had his labs checked prior to this visit.  On his last visit he only stated he was drinking a rare alcoholic beverage. He continues to wear his LifeVest as he is at high risk for sudden death with low ejection fraction.  His pulse is down from 7816 and his blood pressure is 100/84 would be difficult to increase his medications at this point.  No Known Allergies  Current Outpatient Prescriptions  Medication Sig Dispense Refill  . aspirin 81 MG tablet Take 81 mg by mouth daily.    Marland Kitchen. atorvastatin (LIPITOR) 10 MG tablet Take 1 tablet (10 mg total) by mouth daily. 30 tablet 0  . busPIRone (BUSPAR) 10 MG tablet Take 10 mg by mouth 3 (three) times daily as needed. Anxiety  10  . levothyroxine (SYNTHROID, LEVOTHROID) 112 MCG tablet Take 112 mcg by mouth daily.    . metoprolol tartrate (LOPRESSOR) 25 MG tablet Take 0.5 tablets (12.5 mg total) by mouth 2 (two) times daily. 60 tablet 5  . Multiple Vitamin (MULTIVITAMIN WITH MINERALS) TABS Take 1 tablet by mouth daily.    . nitroGLYCERIN (NITROSTAT) 0.4 MG SL tablet Place 1 tablet (0.4 mg total) under the tongue every 5 (five) minutes x 3 doses as needed for chest pain. 25 tablet 12  . ramipril (ALTACE) 2.5 MG capsule Take 1 capsule (2.5 mg total) by mouth daily. 90 capsule 3  . ticagrelor (BRILINTA) 90 MG TABS  tablet Take 1 tablet (90 mg total) by mouth 2 (two) times daily. 60 tablet 9   No current facility-administered medications for this visit.    Past Medical History  Diagnosis Date  . Thyroid disease   . Arthritis   . Anxiety   . Seizures     20 years ago  . Hypothyroidism   . STEMI (ST elevation myocardial infarction) 04/28/14    with stent to LAD  . CAD (coronary artery disease)     residual disease to LCX and RCA  . Ischemic cardiomyopathy     EF 25-30%    Past Surgical History  Procedure Laterality Date  . Neck surgery    . Back surgery    . Appendectomy    . Tonsillectomy    . Coronary angioplasty with stent placement  04/28/14    Promus DES to LAD  . Left heart cath N/A 04/28/2014    Procedure: LEFT HEART CATH;  Surgeon: Peter M SwazilandJordan, MD;  [facility];  Service: Cardiovascular;  Laterality: N/A;    ZOX:[id number]:UJROS:General:no colds or fevers, no weight changes Skin:no rashes or ulcers HEENT:no blurred vision, no congestion CV:see HPI PUL:see HPI GI:no diarrhea constipation or melena, no indigestion GU:no hematuria, no dysuria MS:no joint pain, no claudication Neuro:no syncope,  no lightheadedness Endo:no diabetes, no thyroid disease  Wt Readings from Last 3 Encounters:  05/29/14 217 lb 12.8 oz (98.793 kg)  05/15/14 217 lb (98.431 kg)  05/02/14 213 lb 6.5 oz (96.8 kg)    PHYSICAL EXAM BP 100/84 mmHg  Pulse 60  Ht 5\' 11"  (1.803 m)  Wt 217 lb 12.8 oz (98.793 kg)  BMI 30.39 kg/m2 General:Pleasant affect, NAD Skin:Warm and dry, brisk capillary refill HEENT:normocephalic, sclera clear, mucus membranes moist Neck:supple, no JVD, no bruits  Heart:S1S2 RRR without murmur, gallup, rub or click Lungs:clear without rales, rhonchi, or wheezes WGN:FAOZAbd:soft, non tender, + BS, do not palpate liver spleen or masses Ext:no lower ext edema, 2+ pedal pulses, 2+ radial pulses Neuro:alert and oriented, MAE, follows commands, + facial symmetry EKG:SB at 58 still with deep T  wave inversions post MI.  ASSESSMENT AND PLAN ST elevation myocardial infarction (STEMI) involving left anterior descending (LAD) coronary artery in recovery phase No chest pain or shortness of breath EKG without acute EKG changes.  Cardiomyopathy, ischemic Blood pressure is borderline pulses borderline unable to increase ace or beta blocker today.  At risk for sudden cardiac death, now with lifevest Continue to wear his LifeVest he would need a follow-up echo in February to evaluate need for ICD.  Transaminitis If LFTs continue to be elevated we'll discuss with Dr. SwazilandJordan for further direction.   See Dr. SwazilandJordan 06/29/2014.

## 2014-05-29 NOTE — Assessment & Plan Note (Signed)
No chest pain or shortness of breath EKG without acute EKG changes.

## 2014-05-30 ENCOUNTER — Other Ambulatory Visit: Payer: Self-pay

## 2014-05-30 DIAGNOSIS — R748 Abnormal levels of other serum enzymes: Secondary | ICD-10-CM

## 2014-06-29 ENCOUNTER — Encounter: Payer: Self-pay | Admitting: Cardiology

## 2014-06-29 ENCOUNTER — Ambulatory Visit (INDEPENDENT_AMBULATORY_CARE_PROVIDER_SITE_OTHER): Payer: BLUE CROSS/BLUE SHIELD | Admitting: Cardiology

## 2014-06-29 VITALS — BP 96/72 | HR 66 | Ht 70.0 in | Wt 221.4 lb

## 2014-06-29 DIAGNOSIS — I255 Ischemic cardiomyopathy: Secondary | ICD-10-CM

## 2014-06-29 DIAGNOSIS — I2510 Atherosclerotic heart disease of native coronary artery without angina pectoris: Secondary | ICD-10-CM

## 2014-06-29 DIAGNOSIS — E785 Hyperlipidemia, unspecified: Secondary | ICD-10-CM

## 2014-06-29 DIAGNOSIS — R7401 Elevation of levels of liver transaminase levels: Secondary | ICD-10-CM

## 2014-06-29 DIAGNOSIS — R740 Nonspecific elevation of levels of transaminase and lactic acid dehydrogenase [LDH]: Secondary | ICD-10-CM

## 2014-06-29 LAB — HEPATIC FUNCTION PANEL
ALBUMIN: 4.4 g/dL (ref 3.5–5.2)
ALK PHOS: 81 U/L (ref 39–117)
ALT: 80 U/L — ABNORMAL HIGH (ref 0–53)
AST: 55 U/L — ABNORMAL HIGH (ref 0–37)
BILIRUBIN TOTAL: 1.4 mg/dL — AB (ref 0.2–1.2)
Bilirubin, Direct: 0.2 mg/dL (ref 0.0–0.3)
Indirect Bilirubin: 1.2 mg/dL (ref 0.2–1.2)
Total Protein: 7.1 g/dL (ref 6.0–8.3)

## 2014-06-29 NOTE — Progress Notes (Signed)
06/29/2014   PCP: Olivia CanterKELLY, WILLIAM, MD   Chief Complaint  Patient presents with  . Follow-up    pt denied chest pain and SOB      HPI:   [name] is seen for follow up CAD.   He is s/p anterior STEMI on April 28, 2014 undergoing PCI with Promus drug-eluting stent of the LAD.  EF by cath was 40% but subsequent Echo showed an EF 25-30%. He was given a Lifevest on discharge. He is not wearing it today because he is breaking out with a rash from it but he has been wearing it. He has a history of tobacco abuse but quit smoking last fall. He has a history of Etoh abuse but reports he hasn't had a drink in 4 weeks. He has been walking 30 minutes daily and sometimes twice a day. He denies any chest pain. His biggest complaint is of fatigue. He sleeps a lot. He has persistent elevation of transaminases and statins have been held. No dizziness or syncope. Infrequent dyspnea. No edema.     No Known Allergies  Current Outpatient Prescriptions  Medication Sig Dispense Refill  . aspirin 81 MG tablet Take 81 mg by mouth daily.    . busPIRone (BUSPAR) 10 MG tablet Take 10 mg by mouth 3 (three) times daily as needed. Anxiety  10  . levothyroxine (SYNTHROID, LEVOTHROID) 112 MCG tablet Take 112 mcg by mouth daily.    . metoprolol tartrate (LOPRESSOR) 25 MG tablet Take 0.5 tablets (12.5 mg total) by mouth 2 (two) times daily. 60 tablet 5  . Multiple Vitamin (MULTIVITAMIN WITH MINERALS) TABS Take 1 tablet by mouth daily.    . nitroGLYCERIN (NITROSTAT) 0.4 MG SL tablet Place 1 tablet (0.4 mg total) under the tongue every 5 (five) minutes x 3 doses as needed for chest pain. 25 tablet 12  . ramipril (ALTACE) 2.5 MG capsule Take 1 capsule (2.5 mg total) by mouth daily. 90 capsule 3  . ticagrelor (BRILINTA) 90 MG TABS tablet Take 1 tablet (90 mg total) by mouth 2 (two) times daily. 60 tablet 9   No current facility-administered medications for this visit.    Past Medical History    Diagnosis Date  . Thyroid disease   . Arthritis   . Anxiety   . Seizures     20 years ago  . Hypothyroidism   . STEMI (ST elevation myocardial infarction) 04/28/14    with stent to LAD  . CAD (coronary artery disease)     residual disease to LCX and RCA  . Ischemic cardiomyopathy     EF 25-30%  . Abnormal LFTs     Past Surgical History  Procedure Laterality Date  . Neck surgery    . Back surgery    . Appendectomy    . Tonsillectomy    . Coronary angioplasty with stent placement  04/28/14    Promus DES to LAD  . Left heart cath N/A 04/28/2014    Procedure: LEFT HEART CATH;  Surgeon: Antawn Sison M SwazilandJordan, MD;  [facility];  Service: Cardiovascular;  Laterality: N/A;    ZOX:WRUEAVW:UJROS:General:no colds or fevers, no weight changes Skin:no rashes or ulcers HEENT:no blurred vision, no congestion CV:see HPI PUL:see HPI GI:no diarrhea constipation or melena, no indigestion GU:no hematuria, no dysuria MS:no joint pain, no claudication Neuro:no syncope, no lightheadedness Endo:no diabetes, no thyroid disease  Wt Readings from Last 3 Encounters:  06/29/14 221 lb 6.4  oz (100.426 kg)  05/29/14 217 lb 12.8 oz (98.793 kg)  05/15/14 217 lb (98.431 kg)    PHYSICAL EXAM BP 96/72 mmHg  Pulse 66  Ht  (1.778 m)  Wt 221 lb 6.4 oz (100.426 kg)  BMI 31.77 kg/m2 General:Pleasant affect, NAD Skin:Warm and dry, brisk capillary refill HEENT:normocephalic, sclera clear, mucus membranes moist Neck:supple, no JVD, no bruits  Heart:S1S2 RRR without murmur, gallup, rub or click Lungs:clear without rales, rhonchi, or wheezes ZOX:WRUE, non tender, + BS, do not palpate liver spleen or masses Ext:no lower ext edema, 2+ pedal pulses, 2+ radial pulses Neuro:alert and oriented, MAE, follows commands, + facial symmetry  Laboratory data: Lab Results  Component Value Date   WBC 10.8* 05/02/2014   HGB 13.4 05/02/2014   HCT 39.9 05/02/2014   PLT 213 05/02/2014   GLUCOSE 70 05/15/2014   CHOL  214* 04/29/2014   TRIG 164* 04/29/2014   HDL 43 04/29/2014   LDLCALC 138* 04/29/2014   ALT 195* 05/29/2014   AST 143* 05/29/2014   NA 140 05/15/2014   K 4.7 05/15/2014   CL 106 05/15/2014   CREATININE 1.16 05/15/2014   BUN 18 05/15/2014   CO2 24 05/15/2014   TSH 0.948 05/01/2014   INR 1.05 04/28/2014   HGBA1C 5.7* 02/15/2012     AVW:UJWJX: NSR rate 66 bpm. Anterior infarct age undetermined.     ASSESSMENT AND PLAN 1. CAD s/p anterior STEMI 04/28/14 with DES of proximal LAD. Continue DAPT for at least one year. Patient has residual disease in LCx of 60-70% and RCA of 50% proximal and mid. This will be treated medically without recurrent angina.  2. Ischemic cardiomyopathy with chronic systolic CHF. Well compensated on ramipril and metoprolol. Unable to titrate meds due to hypotension. Will plan repeat Echo in one month. If EF remains less than 35% will needs to consider ICD. Continue Lifevest.  3. Tobacco abuse- now stopped.  4. Etoh abuse. Recommend continued cessation  5. Transaminitis. Will repeat LFTs today off statin and Etoh. If still elevated he will need GI work up. If LFTs improve may rechallenge with statin. If unable to tolerate statin may need to consider Zetial or Welchol.   6. Hyperlipidemia- as noted above.  I will follow up in 3 months.

## 2014-06-29 NOTE — Patient Instructions (Signed)
Continue to abstain from alcohol and tobacco  We will check blood work today for your liver- we will then decide about cholesterol therapy  We will schedule you for an echocardiogram in one month

## 2014-06-30 ENCOUNTER — Other Ambulatory Visit: Payer: Self-pay

## 2014-06-30 DIAGNOSIS — R7402 Elevation of levels of lactic acid dehydrogenase (LDH): Secondary | ICD-10-CM

## 2014-06-30 DIAGNOSIS — R740 Nonspecific elevation of levels of transaminase and lactic acid dehydrogenase [LDH]: Secondary | ICD-10-CM

## 2014-06-30 DIAGNOSIS — E785 Hyperlipidemia, unspecified: Secondary | ICD-10-CM

## 2014-06-30 MED ORDER — EZETIMIBE 10 MG PO TABS: 10.0000 mg | ORAL_TABLET | Freq: Every day | ORAL | Status: DC

## 2014-07-06 ENCOUNTER — Telehealth: Payer: Self-pay | Admitting: Cardiology

## 2014-07-06 NOTE — Telephone Encounter (Signed)
Returned call to patient [name] advised ok to have sex.

## 2014-07-06 NOTE — Telephone Encounter (Signed)
Pt wants to know if his heart is strong enough ,so he can start having sex with his wife?

## 2014-07-31 ENCOUNTER — Ambulatory Visit (HOSPITAL_COMMUNITY)
Admission: RE | Admit: 2014-07-31 | Discharge: 2014-07-31 | Disposition: A | Discharge status: Home / Self Care | Payer: BLUE CROSS/BLUE SHIELD | Source: Ambulatory Visit | Attending: Cardiology | Admitting: Cardiology

## 2014-07-31 DIAGNOSIS — Z720 Tobacco use: Secondary | ICD-10-CM | POA: Diagnosis not present

## 2014-07-31 DIAGNOSIS — E785 Hyperlipidemia, unspecified: Secondary | ICD-10-CM

## 2014-07-31 DIAGNOSIS — R740 Nonspecific elevation of levels of transaminase and lactic acid dehydrogenase [LDH]: Secondary | ICD-10-CM

## 2014-07-31 DIAGNOSIS — I255 Ischemic cardiomyopathy: Secondary | ICD-10-CM

## 2014-07-31 DIAGNOSIS — I2510 Atherosclerotic heart disease of native coronary artery without angina pectoris: Secondary | ICD-10-CM | POA: Diagnosis not present

## 2014-07-31 DIAGNOSIS — I10 Essential (primary) hypertension: Secondary | ICD-10-CM | POA: Diagnosis not present

## 2014-07-31 DIAGNOSIS — Z8249 Family history of ischemic heart disease and other diseases of the circulatory system: Secondary | ICD-10-CM | POA: Insufficient documentation

## 2014-07-31 DIAGNOSIS — R7401 Elevation of levels of liver transaminase levels: Secondary | ICD-10-CM

## 2014-07-31 NOTE — Progress Notes (Addendum)
2D Echocardiogram Complete.  07/31/2014   Randy Beasley, RDCS   Preliminary Technician Findings:  [name] is not currently wearing his LifeVest, he states that it gives him a "rash", and that he "can't stand it anymore".  The Ejection fraction is measure at 35% today, and there is a trivial Pericardial Effusion which does not seem to be affecting hemodynamics.  He does not complain of any symptoms, and appears stable during the exam.

## 2014-08-10 ENCOUNTER — Ambulatory Visit (INDEPENDENT_AMBULATORY_CARE_PROVIDER_SITE_OTHER): Payer: BLUE CROSS/BLUE SHIELD | Admitting: Internal Medicine

## 2014-08-10 ENCOUNTER — Encounter: Payer: Self-pay | Admitting: Internal Medicine

## 2014-08-10 VITALS — BP 116/82 | HR 60 | Ht 70.0 in | Wt 223.4 lb

## 2014-08-10 DIAGNOSIS — I255 Ischemic cardiomyopathy: Secondary | ICD-10-CM

## 2014-08-10 DIAGNOSIS — I2510 Atherosclerotic heart disease of native coronary artery without angina pectoris: Secondary | ICD-10-CM

## 2014-08-10 NOTE — Patient Instructions (Signed)
Your physician recommends that you schedule a follow-up appointment with Dr Johney Frame as needed and Dr Swaziland as scheduled  Your physician has recommended that you have a defibrillator inserted. An implantable cardioverter defibrillator (ICD) is a small device that is placed in your chest or, in rare cases, your abdomen. This device uses electrical pulses or shocks to help control life-threatening, irregular heartbeats that could lead the heart to suddenly stop beating (sudden cardiac arrest). Leads are attached to the ICD that goes into your heart. This is done in the hospital and usually requires an overnight stay. Please see the instruction sheet given to you today for more information.  Cardioverter Defibrillator Implantation An implantable cardioverter defibrillator (ICD) is a small, lightweight, battery-powered device that is placed (implanted) under the skin in the chest or abdomen. Your caregiver may prescribe an ICD if:  You have had an irregular heart rhythm (arrhythmia) that originated in the lower chambers of the heart (ventricles).  Your heart has been damaged by a disease (such as coronary artery disease) or heart condition (such as a heart attack). An ICD consists of a battery that lasts several years, a small computer called a pulse generator, and wires called leads that go into the heart. It is used to detect and correct two dangerous arrhythmias: a rapid heart rhythm (tachycardia) and an arrhythmia in which the ventricles contract in an uncoordinated way (fibrillation). When an ICD detects tachycardia, it sends an electrical signal to the heart that restores the heartbeat to normal (cardioversion). This signal is usually painless. If cardioversion does not work or if the ICD detects fibrillation, it delivers a small electrical shock to the heart (defibrillation) to restart the heart. The shock may feel like a strong jolt in the chest.ICDs may be programmed to correct other problems.  Sometimes, ICDs are programmed to act as another type of implantable device called a pacemaker. Pacemakers are used to treat a slow heartbeat (bradycardia). LET YOUR CAREGIVER KNOW ABOUT:  Any allergies you have.  All medicines you are taking, including vitamins, herbs, eyedrops, and over-the-counter medicines and creams.  Previous problems you or members of your family have had with the use of anesthetics.  Any blood disorders you have had.  Other health problems you have. RISKS AND COMPLICATIONS Generally, the procedure to implant an ICD is safe. However, as with any surgical procedure, complications can occur. Possible complications associated with implanting an ICD include:  Swelling, bleeding, or bruising at the site where the ICD was implanted.  Infection at the site where the ICD was implanted.  A reaction to medicine used during the procedure.  Nerve, heart, or blood vessel damage.  Blood clots. BEFORE THE PROCEDURE  You may need to have blood tests, heart tests, or a chest X-ray done before the day of the procedure.  Ask your caregiver about changing or stopping your regular medicines.  Make plans to have someone drive you home. You may need to stay in the hospital overnight after the procedure.  Stop smoking at least 24 hours before the procedure.  Take a bath or shower the night before the procedure. You may need to scrub your chest or abdomen with a special type of soap.  Do not eat or drink before your procedure for as long as directed by your caregiver. Ask if it is okay to take any needed medicine with a small sip of water. PROCEDURE  The procedure to implant an ICD in your chest or abdomen is usually done at  a hospital in a room that has a large X-ray machine called a fluoroscope. The machine will be above you during the procedure. It will help your caregiver see your heart during the procedure. Implanting an ICD usually takes 1-3 hours. Before the procedure:    Small monitors will be put on your body. They will be used to check your heart, blood pressure, and oxygen level.  A needle will be put into a vein in your hand or arm. This is called an intravenous (IV) access tube. Fluids and medicine will flow directly into your body through the IV tube.  Your chest or abdomen will be cleaned with a germ-killing (antiseptic) solution. The area may be shaved.  You may be given medicine to help you relax (sedative).  You will be given a medicine called a local anesthetic. This medicine will make the surgical site numb while the ICD is implanted. You will be sleepy but awake during the procedure. After you are numb the procedure will begin. The caregiver will:  Make a small cut (incision). This will make a pocket deep under your skin that will hold the pulse generator.  Guide the leads through a large blood vessel into your heart and attach them to the heart muscles. Depending on the ICD, the leads may go into one ventricle or they may go to both ventricles and into an upper chamber of the heart (atrium).  Test the ICD.  Close the incision with stitches, glue, or staples. AFTER THE PROCEDURE  You may feel pain. Some pain is normal. It may last a few days.  You may stay in a recovery area until the local anesthetic has worn off. Your blood pressure and pulse will be checked often. You will be taken to a room where your heart will be monitored.  A chest X-ray will be taken. This is done to check that the cardioverter defibrillator is in the right place.  You may stay in the hospital overnight.  A slight bump may be seen over the skin where the ICD was placed. Sometimes, it is possible to feel the ICD under the skin. This is normal.  In the months and years afterward, your caregiver will check the device, the leads, and the battery every few months. Eventually, when the battery is low, the ICD will be replaced. Document Released: 02/22/2002 Document  Revised: 03/23/2013 Document Reviewed: 06/21/2012 [facility] Patient Information 2015 [employer] [place]. This information is not intended to replace advice given to you by your health care provider. Make sure you discuss any questions you have with your health care provider.

## 2014-08-10 NOTE — Progress Notes (Signed)
Electrophysiology Office Note   Date:  08/10/2014   ID:  Randy Beasley, Finken 07/29/1955, MRN [id number]  PCP:  Olivia Canter, MD  Cardiologist:  Dr Swaziland Primary Electrophysiologist: Hillis Range, MD    Chief Complaint  Patient presents with  . Appointment    ICM & chronic systolic CHF     History of Present Illness: Randy Beasley is a 59 y.o. male who presents today for electrophysiology evaluation.   He presents today for further risk stratification of sudden death.  He reports being in good health until 04/28/14 when he presented with acute anterior MI.   He was evaluated by Dr Swaziland and underwent PCI of the LAD.  He has had slow recovery since that time.  He is walking and working to increase his activity.  He reports fatigue and shortness of breath.  He has mild chest pain occasionally.  Today, he denies symptoms of palpitations,  orthopnea, PND, lower extremity edema, claudication, dizziness, presyncope, syncope,  or neurologic sequela.  He has rare nose bleeds.  The patient is tolerating medications without difficulties and is otherwise without complaint today.    Past Medical History  Diagnosis Date  . Hypothyroidism   . Osteoarthritis   . Anxiety   . Seizures     last was > 20 years ago  . STEMI (ST elevation myocardial infarction) 04/28/14    with stent to LAD  . CAD (coronary artery disease)     residual disease to LCX and RCA  . Ischemic cardiomyopathy     EF 25-30%  . Abnormal LFTs   . Spinal stenosis    Past Surgical History  Procedure Laterality Date  . Neck surgery    . Back surgery    . Appendectomy    . Tonsillectomy    . Coronary angioplasty with stent placement  04/28/14    Promus DES to LAD  . Left heart cath N/A 04/28/2014    Procedure: LEFT HEART CATH;  Surgeon: Peter M Swaziland, MD;  Location: [facility] CATH LAB;  Service: Cardiovascular;  Laterality: N/A;     Current Outpatient Prescriptions  Medication Sig Dispense Refill  . Acetaminophen  (TYLENOL PO) Take 2 tablets by mouth daily as needed (pain). Pt unsure of strength (08/10/14)    . aspirin 81 MG tablet Take 81 mg by mouth daily.    . busPIRone (BUSPAR) 10 MG tablet Take 10 mg by mouth 3 (three) times daily as needed. Anxiety  10  . ezetimibe (ZETIA) 10 MG tablet Take 1 tablet (10 mg total) by mouth daily. 30 tablet 6  . levothyroxine (SYNTHROID, LEVOTHROID) 112 MCG tablet Take 112 mcg by mouth daily.    . metoprolol tartrate (LOPRESSOR) 25 MG tablet Take 0.5 tablets (12.5 mg total) by mouth 2 (two) times daily. 60 tablet 5  . Multiple Vitamin (MULTIVITAMIN WITH MINERALS) TABS Take 1 tablet by mouth 3 (three) times a week.     . nitroGLYCERIN (NITROSTAT) 0.4 MG SL tablet Place 1 tablet (0.4 mg total) under the tongue every 5 (five) minutes x 3 doses as needed for chest pain. 25 tablet 12  . ramipril (ALTACE) 2.5 MG capsule Take 1 capsule (2.5 mg total) by mouth daily. 90 capsule 3  . ticagrelor (BRILINTA) 90 MG TABS tablet Take 1 tablet (90 mg total) by mouth 2 (two) times daily. 60 tablet 9   No current facility-administered medications for this visit.    Allergies:   Review of patient's allergies indicates  no known allergies.   Social History:  The patient  reports that he quit smoking about a year ago. His smoking use included Cigarettes. He smoked 0.00 packs per day for 42 years. He quit smokeless tobacco use about 32 years ago. He reports that he drinks alcohol. He reports that he does not use illicit drugs.   Family History:  The patient's  family history includes Heart disease in his brother; Hypertension in his mother; Peripheral vascular disease in his mother; Stroke in his father.    ROS:  Please see the history of present illness.   All other systems are reviewed and negative.    PHYSICAL EXAM: VS:  BP 116/82 mmHg  Pulse 60  Ht  (1.778 m)  Wt 223 lb 6.4 oz (101.334 kg)  BMI 32.05 kg/m2 , BMI Body mass index is 32.05 kg/(m^2). GEN: Well nourished, well  developed, in no acute distress HEENT: normal Neck: no JVD, carotid bruits, or masses Cardiac: RRR; no murmurs, rubs, or gallops,no edema  Respiratory:  clear to auscultation bilaterally, normal work of breathing GI: soft, nontender, nondistended, + BS MS: no deformity or atrophy Skin: warm and dry  Neuro:  Strength and sensation are intact Psych: euthymic mood, full affect MS- arthritis changes to both hands  EKG:  From 06/29/14 is reviewed and reveals sinus rhythm with anteroseptal infarct, narrow QRS   Recent Labs: 05/01/2014: TSH 0.948 05/02/2014: Hemoglobin 13.4; Platelets 213 05/15/2014: BUN 18; Creatinine 1.16; Potassium 4.7; Sodium 140 06/29/2014: ALT 80*    Lipid Panel     Component Value Date/Time   CHOL 214* 04/29/2014 0300   TRIG 164* 04/29/2014 0300   HDL 43 04/29/2014 0300   CHOLHDL 5.0 04/29/2014 0300   VLDL 33 04/29/2014 0300   LDLCALC 138* 04/29/2014 0300     Wt Readings from Last 3 Encounters:  08/10/14 223 lb 6.4 oz (101.334 kg)  06/29/14 221 lb 6.4 oz (100.426 kg)  05/29/14 217 lb 12.8 oz (98.793 kg)     Other studies Reviewed: Additional studies/ records that were reviewed today include: hospital records, cath report, and Dr Illa Level notes  Echo 07/31/14- EF 30-35%  ASSESSMENT AND PLAN:  1.  The patient has an ischemic CM (EF 30-35%), NYHA Class II/III CHF, and CAD. At this time, he meets MADIT II/ SCD-HeFT criteria for ICD implantation for primary prevention of sudden death.  His QRS is narrow and he therefore does not qualify for CRT.  Risks, benefits, alternatives to ICD implantation were discussed in detail with the patient today. The patient  understands that the risks include but are not limited to bleeding, infection, pneumothorax, perforation, tamponade, vascular damage, renal failure, MI, stroke, death, inappropriate shocks, and lead dislodgement and wishes to decline ICD implant tat this time.  He would prefer to continue medical therapy with  Dr Swaziland and repeat an echo in 3-6 months.  If his EF remains depressed, he may be more interested in an ICD at that time.  He is aware that if he changes his mind, he can contact my office anytime.  2. CAD No ischemic symptoms    Current medicines are reviewed at length with the patient today.   The patient does not have concerns regarding his medicines.  The following changes were made today:  none   Follow-up with Dr Swaziland as scheduled.  I am happy to see again if he decides to proceed with ICD.  Otherwise, I will see as needed going forward.  Signed,  Hillis RangeJames Yesha Muchow, MD  08/10/2014 9:32 AM     [facility] [address] [place] [id number] [phone] (office) [phone] (fax)

## 2014-08-16 ENCOUNTER — Telehealth: Payer: Self-pay | Admitting: Cardiology

## 2014-08-16 NOTE — Telephone Encounter (Signed)
Pt called stating that he has been receiving letter from his disability company in regards to not getting a response from Dr. SwazilandJordan. Please call him back to f/u  Thanks

## 2014-08-16 NOTE — Telephone Encounter (Signed)
Returned call to patient I spoke to Randy Beasley in our medical records, she reviewed your chart on 08/11/14 your records were faxed to your insurance company as requested.

## 2014-08-28 ENCOUNTER — Telehealth: Payer: Self-pay | Admitting: Cardiology

## 2014-08-28 NOTE — Telephone Encounter (Signed)
Pt called in stating that some paperwork needs to be signed in order for him to receive his disability check. Please f/u with the pt  Thanks

## 2014-08-28 NOTE — Telephone Encounter (Signed)
Received call back from patient he stated he called [facility] and his records will be sent to insurance today.

## 2014-08-28 NOTE — Telephone Encounter (Signed)
Returned call to patient he stated his insurance company never received his records they requested.Stated they were billed $88.[facility] she reviewed your chart and [facility] on 08/11/14.Advised to call [facility] at [phone].

## 2014-08-28 NOTE — Telephone Encounter (Signed)
[name] is calling. He has more questions . Please call    Thanks

## 2014-08-28 NOTE — Telephone Encounter (Signed)
Returned call to patient no answer.LMTC. 

## 2014-09-30 LAB — HEPATIC FUNCTION PANEL
ALK PHOS: 71 U/L (ref 39–117)
ALT: 45 U/L (ref 0–53)
AST: 32 U/L (ref 0–37)
Albumin: 4.2 g/dL (ref 3.5–5.2)
Bilirubin, Direct: 0.2 mg/dL (ref 0.0–0.3)
Indirect Bilirubin: 1.3 mg/dL — ABNORMAL HIGH (ref 0.2–1.2)
TOTAL PROTEIN: 6.6 g/dL (ref 6.0–8.3)
Total Bilirubin: 1.5 mg/dL — ABNORMAL HIGH (ref 0.2–1.2)

## 2014-09-30 LAB — LIPID PANEL
CHOL/HDL RATIO: 4.9 ratio
Cholesterol: 182 mg/dL (ref 0–200)
HDL: 37 mg/dL — AB (ref >=40)
LDL CALC: 111 mg/dL — AB (ref 0–99)
Triglycerides: 171 mg/dL — ABNORMAL HIGH (ref <150)
VLDL: 34 mg/dL (ref 0–40)

## 2014-10-02 ENCOUNTER — Encounter: Payer: Self-pay | Admitting: Cardiology

## 2014-10-02 ENCOUNTER — Ambulatory Visit (INDEPENDENT_AMBULATORY_CARE_PROVIDER_SITE_OTHER): Payer: BLUE CROSS/BLUE SHIELD | Admitting: Cardiology

## 2014-10-02 VITALS — BP 100/70 | HR 69 | Ht 71.0 in | Wt 219.0 lb

## 2014-10-02 DIAGNOSIS — E785 Hyperlipidemia, unspecified: Secondary | ICD-10-CM | POA: Diagnosis not present

## 2014-10-02 DIAGNOSIS — I255 Ischemic cardiomyopathy: Secondary | ICD-10-CM

## 2014-10-02 DIAGNOSIS — I2510 Atherosclerotic heart disease of native coronary artery without angina pectoris: Secondary | ICD-10-CM

## 2014-10-02 DIAGNOSIS — R0789 Other chest pain: Secondary | ICD-10-CM | POA: Diagnosis not present

## 2014-10-02 NOTE — Patient Instructions (Signed)
Continue your current therapy including Zetia.  Start lipitor (atorvastatin) 5 mg daily and we will repeat your lab work in a couple of months  I will see you in 6 months.

## 2014-10-02 NOTE — Progress Notes (Signed)
10/02/2014   PCP: Olivia Canter, MD   Chief Complaint  Patient presents with  . Chest Pain    LAST WEEK      HPI:   Randy Beasley is seen for follow up CAD.   He is s/p anterior STEMI on April 28, 2014 undergoing PCI with Promus drug-eluting stent of the LAD.  EF by cath was 40% but subsequent Echo showed an EF 25-30%. Repeat Echo showed EF 30-35% in February. He was seen by Dr. Johney Frame but deferred placement of ICD. He tells me he will never have one placed. He has a history of tobacco abuse but quit smoking last fall. He has a history of Etoh abuse but reports abstinence He has been walking 30 minutes daily. He denies any chest pain. His biggest complaint is of fatigue. He developed transaminitis on high dose statin. This has resolved and he is now on Zetia.     No Known Allergies  Current Outpatient Prescriptions  Medication Sig Dispense Refill  . Acetaminophen (TYLENOL PO) Take 2 tablets by mouth daily as needed (pain). Pt unsure of strength (08/10/14)    . aspirin 81 MG tablet Take 81 mg by mouth daily.    . busPIRone (BUSPAR) 10 MG tablet Take 10 mg by mouth 3 (three) times daily as needed. Anxiety  10  . ezetimibe (ZETIA) 10 MG tablet Take 1 tablet (10 mg total) by mouth daily. 30 tablet 6  . levothyroxine (SYNTHROID, LEVOTHROID) 112 MCG tablet Take 112 mcg by mouth daily.    . metoprolol tartrate (LOPRESSOR) 25 MG tablet Take 0.5 tablets (12.5 mg total) by mouth 2 (two) times daily. 60 tablet 5  . Multiple Vitamin (MULTIVITAMIN WITH MINERALS) TABS Take 1 tablet by mouth 3 (three) times a week.     . nitroGLYCERIN (NITROSTAT) 0.4 MG SL tablet Place 1 tablet (0.4 mg total) under the tongue every 5 (five) minutes x 3 doses as needed for chest pain. 25 tablet 12  . ramipril (ALTACE) 2.5 MG capsule Take 1 capsule (2.5 mg total) by mouth daily. 90 capsule 3  . ticagrelor (BRILINTA) 90 MG TABS tablet Take 1 tablet (90 mg total) by mouth 2 (two) times daily. 60 tablet  9   No current facility-administered medications for this visit.    Past Medical History  Diagnosis Date  . Hypothyroidism   . Osteoarthritis   . Anxiety   . Seizures     last was > 20 years ago  . STEMI (ST elevation myocardial infarction) 04/28/14    with stent to LAD  . CAD (coronary artery disease)     residual disease to LCX and RCA  . Ischemic cardiomyopathy     EF 25-30%  . Abnormal LFTs   . Spinal stenosis     Past Surgical History  Procedure Laterality Date  . Neck surgery    . Back surgery    . Appendectomy    . Tonsillectomy    . Coronary angioplasty with stent placement  04/28/14    Promus DES to LAD  . Left heart cath N/A 04/28/2014    Procedure: LEFT HEART CATH;  Surgeon: Lanita Stammen M Swaziland, MD;  Location: [facility] CATH LAB;  Service: Cardiovascular;  Laterality: N/A;    EAV:WUJWJXB:JY colds or fevers, no weight changes Skin:no rashes or ulcers HEENT:no blurred vision, no congestion CV:see HPI PUL:see HPI GI:no diarrhea constipation or melena, no indigestion GU:no hematuria, no dysuria MS:no joint pain,  no claudication Neuro:no syncope, no lightheadedness Endo:no diabetes, no thyroid disease  Wt Readings from Last 3 Encounters:  10/02/14 219 lb (99.338 kg)  08/10/14 223 lb 6.4 oz (101.334 kg)  06/29/14 221 lb 6.4 oz (100.426 kg)    PHYSICAL EXAM BP 100/70 mmHg  Pulse 69  Ht 5\' 11"  (1.803 m)  Wt 219 lb (99.338 kg)  BMI 30.56 kg/m2 General:Pleasant affect, NAD Skin:Warm and dry, brisk capillary refill HEENT:normocephalic, sclera clear, mucus membranes moist Neck:supple, no JVD, no bruits  Heart:S1S2 RRR without murmur, gallup, rub or click Lungs:clear without rales, rhonchi, or wheezes WJX:BJYNAbd:soft, non tender, + BS, do not palpate liver spleen or masses Ext:no lower ext edema, 2+ pedal pulses, 2+ radial pulses Neuro:alert and oriented, MAE, follows commands, + facial symmetry  Laboratory data: Lab Results  Component Value Date   WBC 10.8*  05/02/2014   HGB 13.4 05/02/2014   HCT 39.9 05/02/2014   PLT 213 05/02/2014   GLUCOSE 70 05/15/2014   CHOL 182 09/29/2014   TRIG 171* 09/29/2014   HDL 37* 09/29/2014   LDLCALC 111* 09/29/2014   ALT 45 09/29/2014   AST 32 09/29/2014   NA 140 05/15/2014   K 4.7 05/15/2014   CL 106 05/15/2014   CREATININE 1.16 05/15/2014   BUN 18 05/15/2014   CO2 24 05/15/2014   TSH 0.948 05/01/2014   INR 1.05 04/28/2014   HGBA1C 5.7* 02/15/2012     WGN:FAOZHEKG:today: NSR rate 54 bpm. Anterior infarct age undetermined. I have personally reviewed and interpreted this study.     ASSESSMENT AND PLAN 1. CAD s/p anterior STEMI 04/28/14 with DES of proximal LAD. Continue DAPT for at least one year. Patient has residual disease in LCx of 60-70% and RCA of 50% proximal and mid. This will be treated medically without recurrent angina.  2. Ischemic cardiomyopathy with chronic systolic CHF. EF 30-35%. Well compensated on ramipril and metoprolol. Unable to titrate meds due to hypotension. Patient has deferred ICD.   3. Tobacco abuse- now stopped.  4. Etoh abuse. Recommend continued cessation  5. Transaminitis. Resolved.  6. Hyperlipidemia- continue Zetia 10 mg daily and add lipitor 5 mg daily. Repeat lab 2 months.  I will follow up in 6 months.

## 2014-10-03 ENCOUNTER — Telehealth: Payer: Self-pay | Admitting: Cardiology

## 2014-10-03 DIAGNOSIS — E785 Hyperlipidemia, unspecified: Secondary | ICD-10-CM

## 2014-10-03 MED ORDER — ROSUVASTATIN CALCIUM 5 MG PO TABS: 5.0000 mg | ORAL_TABLET | Freq: Every day | ORAL | Status: DC

## 2014-10-03 NOTE — Telephone Encounter (Signed)
Returnimg your call from yesterday.

## 2014-10-03 NOTE — Telephone Encounter (Signed)
Returned call to patient lab results given.Advised to start crestor 5 mg daily.Repeat fasting lab in 2 months.Crestor savings card and lab slip left at front desk.

## 2014-12-09 LAB — HEPATIC FUNCTION PANEL
ALBUMIN: 4.5 g/dL (ref 3.5–5.2)
ALK PHOS: 74 U/L (ref 39–117)
ALT: 85 U/L — ABNORMAL HIGH (ref 0–53)
AST: 63 U/L — ABNORMAL HIGH (ref 0–37)
BILIRUBIN DIRECT: 0.2 mg/dL (ref 0.0–0.3)
Indirect Bilirubin: 1 mg/dL (ref 0.2–1.2)
Total Bilirubin: 1.2 mg/dL (ref 0.2–1.2)
Total Protein: 7 g/dL (ref 6.0–8.3)

## 2014-12-09 LAB — LIPID PANEL
CHOL/HDL RATIO: 3.6 ratio
Cholesterol: 164 mg/dL (ref 0–200)
HDL: 45 mg/dL (ref >=40)
LDL CALC: 90 mg/dL (ref 0–99)
TRIGLYCERIDES: 143 mg/dL (ref <150)
VLDL: 29 mg/dL (ref 0–40)

## 2014-12-15 ENCOUNTER — Other Ambulatory Visit: Payer: Self-pay

## 2014-12-15 DIAGNOSIS — I2102 ST elevation (STEMI) myocardial infarction involving left anterior descending coronary artery: Secondary | ICD-10-CM

## 2014-12-15 DIAGNOSIS — E785 Hyperlipidemia, unspecified: Secondary | ICD-10-CM

## 2014-12-15 DIAGNOSIS — I255 Ischemic cardiomyopathy: Secondary | ICD-10-CM

## 2015-01-01 ENCOUNTER — Telehealth: Payer: Self-pay | Admitting: Cardiology

## 2015-01-01 NOTE — Telephone Encounter (Signed)
Received call from patient he stated he will bringing disability form to office this afternoon.

## 2015-01-01 NOTE — Telephone Encounter (Signed)
Please call,question about his disability.

## 2015-01-02 NOTE — Telephone Encounter (Signed)
Received call from patient.Advised I will have [name] complete and sign physical capabilities questionnaire.Also [name] received request from [employer] for your records.Form gave to Larita FifeLynn in medical records she will fax requested records

## 2015-01-04 NOTE — Telephone Encounter (Signed)
Physical capabilities form not cardiac, PCP to complete.Form gave to Larita Fife in medical records to fax with other office notes to [employer].

## 2015-01-17 ENCOUNTER — Other Ambulatory Visit: Payer: Self-pay | Admitting: Physician Assistant

## 2015-01-17 NOTE — Telephone Encounter (Signed)
Rx(s) sent to pharmacy electronically.  

## 2015-01-18 ENCOUNTER — Other Ambulatory Visit: Payer: Self-pay | Admitting: *Deleted

## 2015-01-18 MED ORDER — EZETIMIBE 10 MG PO TABS: 10.0000 mg | ORAL_TABLET | Freq: Every day | ORAL | Status: DC

## 2015-02-21 LAB — LIPID PANEL
Cholesterol: 168 mg/dL (ref 125–200)
HDL: 43 mg/dL (ref >=40)
LDL Cholesterol: 101 mg/dL (ref <130)
TRIGLYCERIDES: 118 mg/dL (ref <150)
Total CHOL/HDL Ratio: 3.9 Ratio (ref <=5.0)
VLDL: 24 mg/dL (ref <30)

## 2015-02-22 ENCOUNTER — Other Ambulatory Visit: Payer: Self-pay

## 2015-02-22 LAB — HEPATIC FUNCTION PANEL
ALT: 247 U/L — AB (ref 9–46)
AST: 153 U/L — AB (ref 10–35)
Albumin: 4.5 g/dL (ref 3.6–5.1)
Alkaline Phosphatase: 79 U/L (ref 40–115)
BILIRUBIN DIRECT: 0.3 mg/dL — AB (ref <=0.2)
Indirect Bilirubin: 1.7 mg/dL — ABNORMAL HIGH (ref 0.2–1.2)
Total Bilirubin: 2 mg/dL — ABNORMAL HIGH (ref 0.2–1.2)
Total Protein: 7 g/dL (ref 6.1–8.1)

## 2015-03-08 ENCOUNTER — Other Ambulatory Visit: Payer: Self-pay | Admitting: Cardiology

## 2015-03-08 NOTE — Telephone Encounter (Signed)
Rx request sent to pharmacy.  

## 2015-04-05 ENCOUNTER — Ambulatory Visit (INDEPENDENT_AMBULATORY_CARE_PROVIDER_SITE_OTHER): Payer: BLUE CROSS/BLUE SHIELD | Admitting: Cardiology

## 2015-04-05 ENCOUNTER — Encounter: Payer: Self-pay | Admitting: Cardiology

## 2015-04-05 VITALS — BP 116/84 | HR 75 | Ht 71.0 in | Wt 240.3 lb

## 2015-04-05 DIAGNOSIS — I255 Ischemic cardiomyopathy: Secondary | ICD-10-CM | POA: Diagnosis not present

## 2015-04-05 DIAGNOSIS — Z23 Encounter for immunization: Secondary | ICD-10-CM

## 2015-04-05 DIAGNOSIS — I2510 Atherosclerotic heart disease of native coronary artery without angina pectoris: Secondary | ICD-10-CM | POA: Diagnosis not present

## 2015-04-05 DIAGNOSIS — E785 Hyperlipidemia, unspecified: Secondary | ICD-10-CM | POA: Diagnosis not present

## 2015-04-05 DIAGNOSIS — I5022 Chronic systolic (congestive) heart failure: Secondary | ICD-10-CM

## 2015-04-05 HISTORY — DX: Chronic systolic (congestive) heart failure: I50.22

## 2015-04-05 MED ORDER — METOPROLOL TARTRATE 25 MG PO TABS: 12.5000 mg | ORAL_TABLET | Freq: Two times a day (BID) | ORAL | Status: DC

## 2015-04-05 MED ORDER — EZETIMIBE 10 MG PO TABS: 10.0000 mg | ORAL_TABLET | Freq: Every day | ORAL | Status: DC

## 2015-04-05 MED ORDER — CLOPIDOGREL BISULFATE 75 MG PO TABS: 75.0000 mg | ORAL_TABLET | Freq: Every day | ORAL | Status: DC

## 2015-04-05 NOTE — Addendum Note (Signed)
Addended by: Meda KlinefelterPUGH, Mitcheal Sweetin JOHNSON D on: 04/05/2015 10:03 AM   Modules accepted: Orders

## 2015-04-05 NOTE — Progress Notes (Signed)
04/05/2015   PCP: Olivia CanterKELLY, WILLIAM, MD   Chief Complaint  Patient presents with  . Follow-up  . Chest Pain    pt c/o having tightness and discomfort   . Edema    no swelling in legs  . Shortness of Breath    pt c/o getting SOB when bending over and laying in the bed but now sleeps in a recliner, no dizziness      HPI:   [name] is seen for follow up CAD.   He is s/p anterior STEMI on April 28, 2014 undergoing PCI with a long Promus drug-eluting stent of the LAD.  EF by cath was 40% but subsequent Echo showed an EF 25-30%. Repeat Echo showed EF 30-35% in February. He was seen by Dr. Johney FrameAllred but deferred placement of ICD. He told me he will never have one placed. He has a history of tobacco abuse but quit smoking. He has a history of Etoh abuse but reports abstinence He admits that he has let himself go. He has not been exercising. He has gained 21 lbs. States he has no energy to do anything and notes some SOB with exertion. No chest pain.  He developed transaminitis on very low dose statin. He is now on Zetia only. Reports his BP runs low at times. States he cannot perform tasks of prior job which was very physical.      No Known Allergies  Current Outpatient Prescriptions  Medication Sig Dispense Refill  . Acetaminophen (TYLENOL PO) Take 2 tablets by mouth daily as needed (pain). Pt unsure of strength (08/10/14)    . aspirin 81 MG tablet Take 81 mg by mouth daily.    . busPIRone (BUSPAR) 10 MG tablet Take 10 mg by mouth 3 (three) times daily as needed. Anxiety  10  . clopidogrel (PLAVIX) 75 MG tablet Take 1 tablet (75 mg total) by mouth daily. 90 tablet 3  . ezetimibe (ZETIA) 10 MG tablet Take 1 tablet (10 mg total) by mouth daily. 90 tablet 3  . levothyroxine (SYNTHROID, LEVOTHROID) 112 MCG tablet Take 112 mcg by mouth daily.    . metoprolol tartrate (LOPRESSOR) 25 MG tablet Take 0.5 tablets (12.5 mg total) by mouth 2 (two) times daily. 90 tablet 3  . Multiple  Vitamin (MULTIVITAMIN WITH MINERALS) TABS Take 1 tablet by mouth 3 (three) times a week.     . nitroGLYCERIN (NITROSTAT) 0.4 MG SL tablet Place 1 tablet (0.4 mg total) under the tongue every 5 (five) minutes x 3 doses as needed for chest pain. 25 tablet 12  . ramipril (ALTACE) 2.5 MG capsule Take 1 capsule (2.5 mg total) by mouth daily. 90 capsule 3   No current facility-administered medications for this visit.    Past Medical History  Diagnosis Date  . Hypothyroidism   . Osteoarthritis   . Anxiety   . Seizures (HCC)     last was > 20 years ago  . STEMI (ST elevation myocardial infarction) (HCC) 04/28/14    with stent to LAD  . CAD (coronary artery disease)     residual disease to LCX and RCA  . Ischemic cardiomyopathy     EF 25-30%  . Abnormal LFTs   . Spinal stenosis   . Chronic systolic CHF (congestive heart failure) (HCC) 04/05/2015    Past Surgical History  Procedure Laterality Date  . Neck surgery    . Back surgery    . Appendectomy    .  Tonsillectomy    . Coronary angioplasty with stent placement  04/28/14    Promus DES to LAD  . Left heart cath N/A 04/28/2014    Procedure: LEFT HEART CATH;  Surgeon: Peter M Swaziland, MD;  Location: [facility] CATH LAB;  Service: Cardiovascular;  Laterality: N/A;    ION:GEXBMWU:XL colds or fevers, no weight changes Skin:no rashes or ulcers HEENT:no blurred vision, no congestion CV:see HPI PUL:see HPI GI:no diarrhea constipation or melena, no indigestion GU:no hematuria, no dysuria MS:no joint pain, no claudication Neuro:no syncope, no lightheadedness Endo:no diabetes, no thyroid disease  Wt Readings from Last 3 Encounters:  04/05/15 108.999 kg (240 lb 4.8 oz)  10/02/14 99.338 kg (219 lb)  08/10/14 101.334 kg (223 lb 6.4 oz)    PHYSICAL EXAM BP 116/84 mmHg  Pulse 75  Ht  (1.803 m)  Wt 108.999 kg (240 lb 4.8 oz)  BMI 33.53 kg/m2 General:Pleasant affect, NAD Skin:Warm and dry, brisk capillary refill HEENT:normocephalic,  sclera clear, mucus membranes moist Neck:supple, no JVD, no bruits  Heart:S1S2 RRR without murmur, gallup, rub or click Lungs:clear without rales, rhonchi, or wheezes KGM:WNUU, non tender, + BS, do not palpate liver spleen or masses Ext:no lower ext edema, 2+ pedal pulses, 2+ radial pulses Neuro:alert and oriented, MAE, follows commands, + facial symmetry  Laboratory data: Lab Results  Component Value Date   WBC 10.8* 05/02/2014   HGB 13.4 05/02/2014   HCT 39.9 05/02/2014   PLT 213 05/02/2014   GLUCOSE 70 05/15/2014   CHOL 168 02/20/2015   TRIG 118 02/20/2015   HDL 43 02/20/2015   LDLCALC 101 02/20/2015   ALT 247* 02/20/2015   AST 153* 02/20/2015   NA 140 05/15/2014   K 4.7 05/15/2014   CL 106 05/15/2014   CREATININE 1.16 05/15/2014   BUN 18 05/15/2014   CO2 24 05/15/2014   TSH 0.948 05/01/2014   INR 1.05 04/28/2014   HGBA1C 5.7* 02/15/2012     VOZ:DGUYQ: NSR rate 75 bpm. Anterior infarct age old. I have personally reviewed and interpreted this study.     ASSESSMENT AND PLAN 1. CAD s/p anterior STEMI 04/28/14 with DES of proximal LAD. Continue DAPT. Given residual disease and long stent in LAD during acute MI I would favor continuing indefinitely.  Patient has residual disease in LCx of 60-70% and RCA of 50% proximal and mid. This will be treated medically without recurrent angina. Will switch Brilinta to Plavix 75 mg daily and see if this helps his dyspnea.   2. Ischemic cardiomyopathy with chronic systolic CHF. EF 30-35%. Well compensated on ramipril and metoprolol. Unable to titrate meds due to hypotension. Patient has deferred ICD. He has stable class 2-3 symptoms.  3. Tobacco abuse- now stopped.  4. Etoh abuse. Recommend continued cessation  5. Transaminitis. Related to statins.   6. Hyperlipidemia- continue Zetia 10 mg daily. Repeat lab 6 months.  Encourage resumption of regular exercise. Needs to lose weight. Flu shot given today.   I will follow up in 6  months.

## 2015-04-05 NOTE — Patient Instructions (Signed)
When you run out of Brilinta stop it and start Plavix 75 mg daily  Continue your other therapy  You need to focus on weight loss and getting back into exercise routine.  I will see you in 6 months with fasting lab work

## 2015-04-28 ENCOUNTER — Other Ambulatory Visit: Payer: Self-pay | Admitting: Cardiology

## 2015-05-01 ENCOUNTER — Other Ambulatory Visit: Payer: Self-pay | Admitting: Cardiology

## 2015-05-02 NOTE — Telephone Encounter (Signed)
Rx has been sent to the pharmacy electronically. ° °

## 2015-06-15 ENCOUNTER — Telehealth: Payer: Self-pay | Admitting: Cardiology

## 2015-06-15 NOTE — Telephone Encounter (Signed)
Spoke to patient Patient states he has noted over the last week prior to christmas   weight gain- no scales to weight Patient unable to check blood pressure  no diuretics  Notice swelling in  extremities  - ( has eaten more salty foods lately) Patient states he sleeps in a recliner , but that is not new ,he has been doing that since surgery Patient did his walk this morning - no issues. Appointment  Schedule with extender 06/21/15 Instructed increase problems go to  ER.  WILL defer Dr SwazilandJordan

## 2015-06-15 NOTE — Telephone Encounter (Signed)
Pt c/o swelling: STAT is pt has developed SOB within 24 hours  1. How long have you been experiencing swelling? About a week   2. Where is the swelling located?  Abdomin,Ankles, arms, and legs  3.  Are you currently taking a "fluid pill"?No   4.  Are you currently SOB? Yes but nothing out of the normal he says.   5.  Have you traveled recently? Pt did not say

## 2015-06-16 NOTE — Telephone Encounter (Signed)
Agree with recs  Peter Jordan MD, FACC  

## 2015-06-21 ENCOUNTER — Ambulatory Visit (INDEPENDENT_AMBULATORY_CARE_PROVIDER_SITE_OTHER): Payer: BLUE CROSS/BLUE SHIELD | Admitting: Physician Assistant

## 2015-06-21 ENCOUNTER — Encounter: Payer: Self-pay | Admitting: Physician Assistant

## 2015-06-21 VITALS — BP 126/86 | HR 77 | Ht 70.0 in | Wt 250.5 lb

## 2015-06-21 DIAGNOSIS — I5023 Acute on chronic systolic (congestive) heart failure: Secondary | ICD-10-CM

## 2015-06-21 DIAGNOSIS — I2510 Atherosclerotic heart disease of native coronary artery without angina pectoris: Secondary | ICD-10-CM

## 2015-06-21 MED ORDER — FUROSEMIDE 20 MG PO TABS: 20.0000 mg | ORAL_TABLET | Freq: Every day | ORAL | Status: DC

## 2015-06-21 NOTE — Patient Instructions (Signed)
Your physician has recommended you make the following change in your medication: start new furosemide prescription. This has been sent to your pharmacy.  Your physician recommends that you schedule a follow-up appointment in: 2-3 months with Dr SwazilandJordan.

## 2015-06-21 NOTE — Progress Notes (Signed)
Cardiology Office Note   Date:  06/21/2015   ID:  Randy CottonCarlos N Sayre, DOB [birth year], MRN [id number]  PCP:  Olivia CanterKELLY, WILLIAM, MD  Cardiologist:  Dr SwazilandJordan  Barrett, Rhonda, PA-C   Chief Complaint  Patient presents with  . Edema    having chest pain-same as usual, has shortness of breath, edema-arms and belly,no pain in legs,no cramping in legs, no lightheadedness or dizziness    History of Present Illness: Randy Beasley is a 60 y.o. male with a history of STEMI 04/2014 w/ DES LAD residual disease in LCx of 60-70% and RCA of 50% treated medically, ICD w/ EF 40%>>25%>>35% 07/2014 (refused ICD), remote ETOH & tobacco, transaminitis 2nd statins  Randy Cottonarlos N Bonine presents for evaluation of chest pain and edema.  He has continued to have chest pain since his MI. It is not consistently with exertion. It is atypical for angina. He does not take nitroglycerin for it. He gets it regularly when he lays on his left side.   He has had dyspnea on exertion that is a little worse recently. He does not have PND but has orthopnea. He has been sleeping in a recliner. He admits his weight is up but isn't quite sure how much. He has had lower extremity edema, a small amount in the morning and more during the day. The edema was worse right after the holidays but has improved. He does still feel some swelling in his arms and abdomen.  He is concerned about the cost of health care Mrs. insurance may change soon. He does not wish any testing unless it is absolutely necessary and needs the most inexpensive medications available.   Past Medical History  Diagnosis Date  . Hypothyroidism   . Osteoarthritis   . Anxiety   . Seizures (HCC)     last was > 20 years ago  . STEMI (ST elevation myocardial infarction) (HCC) 04/28/14    with stent to LAD  . CAD (coronary artery disease)     residual disease to LCX and RCA  . Ischemic cardiomyopathy     EF 25-30%  . Abnormal LFTs   . Spinal stenosis   . Chronic  systolic CHF (congestive heart failure) (HCC) 04/05/2015    Past Surgical History  Procedure Laterality Date  . Neck surgery    . Back surgery    . Appendectomy    . Tonsillectomy    . Coronary angioplasty with stent placement  04/28/14    Promus DES to LAD  . Left heart cath N/A 04/28/2014    Procedure: LEFT HEART CATH;  Surgeon: Peter M SwazilandJordan, MD;  [facility] LAB;  Service: Cardiovascular;  Laterality: N/A;    Current Outpatient Prescriptions  Medication Sig Dispense Refill  . Acetaminophen (TYLENOL PO) Take 2 tablets by mouth daily as needed (pain). Pt unsure of strength (08/10/14)    . aspirin 81 MG tablet Take 81 mg by mouth daily.    . busPIRone (BUSPAR) 10 MG tablet Take 10 mg by mouth 3 (three) times daily as needed. Anxiety  10  . clopidogrel (PLAVIX) 75 MG tablet Take 1 tablet (75 mg total) by mouth daily. 90 tablet 3  . ezetimibe (ZETIA) 10 MG tablet Take 1 tablet (10 mg total) by mouth daily. 90 tablet 3  . levothyroxine (SYNTHROID, LEVOTHROID) 112 MCG tablet Take 112 mcg by mouth daily.    . metoprolol tartrate (LOPRESSOR) 25 MG tablet Take 0.5 tablets (12.5 mg total) by mouth  2 (two) times daily. 90 tablet 3  . Multiple Vitamin (MULTIVITAMIN WITH MINERALS) TABS Take 1 tablet by mouth 3 (three) times a week.     . nitroGLYCERIN (NITROSTAT) 0.4 MG SL tablet Place 1 tablet (0.4 mg total) under the tongue every 5 (five) minutes x 3 doses as needed for chest pain. 25 tablet 12  . ramipril (ALTACE) 2.5 MG capsule TAKE ONE CAPSULE BY MOUTH ONE TIME DAILY 90 capsule 2   No current facility-administered medications for this visit.    Allergies:   Review of patient's allergies indicates no known allergies.    Social History:  The patient  reports that he quit smoking about 22 months ago. His smoking use included Cigarettes. He smoked 0.00 packs per day for 42 years. He quit smokeless tobacco use about 33 years ago. He reports that he drinks alcohol. He reports that he  does not use illicit drugs.   Family History:  The patient's family history includes Heart disease in his brother; Hypertension in his mother; Peripheral vascular disease in his mother; Stroke in his father.    ROS:  Please see the history of present illness. All other systems are reviewed and negative.    PHYSICAL EXAM: VS:  BP 126/86 mmHg  Pulse 77  Ht 5\' 10"  (1.778 m)  Wt 250 lb 8 oz (113.626 kg)  BMI 35.94 kg/m2 , BMI Body mass index is 35.94 kg/(m^2). GEN: Well nourished, well developed, male in no acute distress HEENT: normal for age  Neck: JVD at 8 cm with mild hepatojugular reflux, no carotid bruit, no masses Cardiac: RRR; no murmur, no rubs, or gallops Respiratory:  Decreased breath sounds bases bilaterally, normal work of breathing GI: soft, nontender, nondistended, + BS MS: no deformity or atrophy; trace edema; distal pulses are 2+ in all 4 extremities  Skin: warm and dry, no rash Neuro:  Strength and sensation are intact Psych: euthymic mood, full affect   EKG:  EKG is not ordered today.  Recent Labs: 02/20/2015: ALT 247*    Lipid Panel    Component Value Date/Time   CHOL 168 02/20/2015 0846   TRIG 118 02/20/2015 0846   HDL 43 02/20/2015 0846   CHOLHDL 3.9 02/20/2015 0846   VLDL 24 02/20/2015 0846   LDLCALC 101 02/20/2015 0846     Wt Readings from Last 3 Encounters:  06/21/15 250 lb 8 oz (113.626 kg)  04/05/15 240 lb 4.8 oz (108.999 kg)  10/02/14 219 lb (99.338 kg)     Other studies Reviewed: Additional studies/ records that were reviewed today include: Office notes and hospital records.  ASSESSMENT AND PLAN:  1.  Acute on chronic systolic CHF: He has not been on a diuretic. He admits to some dietary indiscretions over the holidays. He has apparently on a diuresed some, but still is symptomatic and has some volume overload by exam. We will start Lasix 20 mg daily. He is encouraged to continue daily weights and stick to a low sodium diet.  2. CAD,  chest pain: This Was over a year ago and he had a 70% circumflex and a 50% RCA at that time that were treated medically. His chest pain is not typical for angina. He does not wish to do any further testing at this time. He is on aspirin, Zetia, an ACE inhibitor, and a beta blocker.   Current medicines are reviewed at length with the patient today.  The patient does not have concerns regarding medicines.  The following changes  have been made:  Add Lasix  Labs/ tests ordered today include:  No orders of the defined types were placed in this encounter.     Disposition:   FU with Dr. Swaziland  Signed, Leanna Battles  06/21/2015 10:24 AM    [facility] [address] [place], [place], [place]  [address] Phone: [phone]; Fax: [phone]

## 2015-08-20 ENCOUNTER — Telehealth: Payer: Self-pay | Admitting: Cardiology

## 2015-08-20 NOTE — Telephone Encounter (Signed)
Routed for recommendations

## 2015-08-20 NOTE — Telephone Encounter (Signed)
Unfortunately the cost will be high for another month or two.  Suggest that for now he cut his tablets in half, taking 5 mg daily, maybe by then the price will drop to something affordable.  [facility] at [facility] price is >$200 right now.  Should drop by May or June at the latest

## 2015-08-20 NOTE — Telephone Encounter (Signed)
Recommendation given to patient. He voiced understanding.

## 2015-08-20 NOTE — Telephone Encounter (Signed)
New message    Pt calling for [name] to write him a generic brand for the medication   Zetia unknown mg  The price went up to 200.00

## 2015-09-25 ENCOUNTER — Telehealth: Payer: Self-pay | Admitting: Cardiology

## 2015-09-25 NOTE — Telephone Encounter (Signed)
Returned call to patient no answer.LMTC. 

## 2015-09-25 NOTE — Telephone Encounter (Signed)
He cannot take statins due to elevated LFTs. There is no generic substitute for Zetia. If unable to afford we really don't have an alternative. Continue to work on healthy eating and exercise.  Peter SwazilandJordan MD, AvalaFACC

## 2015-09-25 NOTE — Telephone Encounter (Signed)
New Message  Pt c/o medication issue:  1. Name of Medication: Zetia    4. What is your medication issue? Insurance no longer covers the medication request a call back for the generic.

## 2015-09-25 NOTE — Telephone Encounter (Signed)
Patient changed insurance and his zetia went from $10 up to $200-300.   Patient did take crestor 5 mg but liver enzymes increased but he has an old prescription and was alternating with zetia and crestor for at least 2 months.   Will route message to Dr. SwazilandJordan & Elnita Maxwellheryl

## 2015-09-26 ENCOUNTER — Telehealth: Payer: Self-pay | Admitting: Cardiology

## 2015-09-26 NOTE — Telephone Encounter (Signed)
Follow Up  ° °Pt returned the call  °

## 2015-09-26 NOTE — Telephone Encounter (Signed)
See 09/26/15 phone note. 

## 2015-09-26 NOTE — Telephone Encounter (Signed)
Pt aware of Dr. Elvis CoilJordan's recommendations regarding cost of Zetia, encouragement of diet and exercise.  Pt aware he is welcome to call if any further concerns.  He voiced acknowledgment of instructions and thanks for return call.

## 2015-12-24 ENCOUNTER — Encounter: Payer: Self-pay | Admitting: Cardiology

## 2015-12-24 ENCOUNTER — Other Ambulatory Visit: Payer: BLUE CROSS/BLUE SHIELD

## 2015-12-24 ENCOUNTER — Ambulatory Visit (INDEPENDENT_AMBULATORY_CARE_PROVIDER_SITE_OTHER): Payer: BLUE CROSS/BLUE SHIELD | Admitting: Cardiology

## 2015-12-24 ENCOUNTER — Telehealth: Payer: Self-pay | Admitting: Cardiology

## 2015-12-24 VITALS — BP 92/74 | HR 62 | Ht 71.0 in | Wt 241.4 lb

## 2015-12-24 DIAGNOSIS — I255 Ischemic cardiomyopathy: Secondary | ICD-10-CM

## 2015-12-24 DIAGNOSIS — I2510 Atherosclerotic heart disease of native coronary artery without angina pectoris: Secondary | ICD-10-CM | POA: Diagnosis not present

## 2015-12-24 DIAGNOSIS — Z79899 Other long term (current) drug therapy: Secondary | ICD-10-CM

## 2015-12-24 DIAGNOSIS — E039 Hypothyroidism, unspecified: Secondary | ICD-10-CM

## 2015-12-24 DIAGNOSIS — E785 Hyperlipidemia, unspecified: Secondary | ICD-10-CM

## 2015-12-24 DIAGNOSIS — I5022 Chronic systolic (congestive) heart failure: Secondary | ICD-10-CM | POA: Diagnosis not present

## 2015-12-24 LAB — LIPID PANEL
CHOLESTEROL: 242 mg/dL — AB (ref 125–200)
HDL: 23 mg/dL — ABNORMAL LOW (ref >=40)
LDL CALC: 180 mg/dL — AB (ref <130)
Total CHOL/HDL Ratio: 10.5 Ratio — ABNORMAL HIGH (ref <=5.0)
Triglycerides: 193 mg/dL — ABNORMAL HIGH (ref <150)
VLDL: 39 mg/dL — AB (ref <30)

## 2015-12-24 LAB — HEPATIC FUNCTION PANEL
ALT: 132 U/L — ABNORMAL HIGH (ref 9–46)
AST: 132 U/L — ABNORMAL HIGH (ref 10–35)
Albumin: 4.4 g/dL (ref 3.6–5.1)
Alkaline Phosphatase: 106 U/L (ref 40–115)
BILIRUBIN DIRECT: 0.3 mg/dL — AB (ref <=0.2)
BILIRUBIN INDIRECT: 0.9 mg/dL (ref 0.2–1.2)
Total Bilirubin: 1.2 mg/dL (ref 0.2–1.2)
Total Protein: 7.1 g/dL (ref 6.1–8.1)

## 2015-12-24 LAB — BASIC METABOLIC PANEL
BUN: 13 mg/dL (ref 7–25)
CHLORIDE: 105 mmol/L (ref 98–110)
CO2: 25 mmol/L (ref 20–31)
Calcium: 9.3 mg/dL (ref 8.6–10.3)
Creat: 1.25 mg/dL (ref 0.70–1.25)
Glucose, Bld: 98 mg/dL (ref 65–99)
POTASSIUM: 4.7 mmol/L (ref 3.5–5.3)
Sodium: 139 mmol/L (ref 135–146)

## 2015-12-24 LAB — TSH: TSH: 14.49 mIU/L — ABNORMAL HIGH (ref 0.40–4.50)

## 2015-12-24 MED ORDER — FUROSEMIDE 20 MG PO TABS: 20.0000 mg | ORAL_TABLET | Freq: Every day | ORAL | Status: DC

## 2015-12-24 NOTE — Telephone Encounter (Signed)
[facility] is at [facility] and there are no lab orders in system. Labs were ordered in Oct 2016 for patient to have done today at [facility]. Re-ordered BMET, lipid, liver, and added on TSH (OK per MD)

## 2015-12-24 NOTE — Progress Notes (Signed)
Cardiology Office Note   Date:  12/24/2015   ID:  Randy Beasley, DOB 04/10/1956, MRN [id number]  PCP:  Olivia CanterKELLY, WILLIAM, MD  Cardiologist:  Dr SwazilandJordan  Peter SwazilandJordan, MD   Chief Complaint  Patient presents with  . Coronary Artery Disease    History of Present Illness: Randy Beasley is a 60 y.o. male with a history of STEMI 04/2014 w/ DES LAD residual disease in LCx of 60-70% and RCA of 50% treated medically, ICD w/ EF 40%>>25%>>35% 07/2014 (refused ICD), remote ETOH & tobacco, transaminitis 2nd statins  He has infrequent symptoms of chest pain typically related to stress.   [facility] with increased swelling that resolved with lasix. Weight is down 9 lbs. He is working on his diet and walking. He denies any Etoh or tobacco abuse.   He has developed transaminitis on even low dose Crestor/lipitor. He is unable to afford Zetia. States he has "bare bones" insurance without medication coverage.    Past Medical History  Diagnosis Date  . Hypothyroidism   . Osteoarthritis   . Anxiety   . Seizures (HCC)     last was > 20 years ago  . STEMI (ST elevation myocardial infarction) (HCC) 04/28/14    with stent to LAD  . CAD (coronary artery disease)     residual disease to LCX and RCA  . Ischemic cardiomyopathy     EF 25-30%  . Abnormal LFTs   . Spinal stenosis   . Chronic systolic CHF (congestive heart failure) (HCC) 04/05/2015    Past Surgical History  Procedure Laterality Date  . Neck surgery    . Back surgery    . Appendectomy    . Tonsillectomy    . Coronary angioplasty with stent placement  04/28/14    Promus DES to LAD  . Left heart cath N/A 04/28/2014    Procedure: LEFT HEART CATH;  Surgeon: Peter M SwazilandJordan, MD;  [facility];  Service: Cardiovascular;  Laterality: N/A;      Medication List       This list is accurate as of: 12/24/15  9:14 AM.  Always use your most recent med list.               aspirin 81 MG  tablet  Take 81 mg by mouth daily.     busPIRone 10 MG tablet  Commonly known as:  BUSPAR  Take 10 mg by mouth 3 (three) times daily as needed. Anxiety     clopidogrel 75 MG tablet  Commonly known as:  PLAVIX  Take 1 tablet (75 mg total) by mouth daily.     furosemide 20 MG tablet  Commonly known as:  LASIX  Take 1 tablet (20 mg total) by mouth daily.     levothyroxine 112 MCG tablet  Commonly known as:  SYNTHROID, LEVOTHROID  Take 112 mcg by mouth daily.     metoprolol tartrate 25 MG tablet  Commonly known as:  LOPRESSOR  Take 0.5 tablets (12.5 mg total) by mouth 2 (two) times daily.     multivitamin with minerals Tabs tablet  Take 1 tablet by mouth 3 (three) times a week. Reported on 12/24/2015     nitroGLYCERIN 0.4 MG SL tablet  Commonly known as:  NITROSTAT  Place 1 tablet (0.4 mg total) under the tongue every 5 (five) minutes x 3 doses as needed for chest pain.     ramipril 2.5 MG  capsule  Commonly known as:  ALTACE  TAKE ONE CAPSULE BY MOUTH ONE TIME DAILY     TYLENOL PO  Take 2 tablets by mouth daily as needed (pain). Pt unsure of strength (08/10/14)        Allergies:   Review of patient's allergies indicates no known allergies.    Social History:  The patient  reports that he quit smoking about 2 years ago. His smoking use included Cigarettes. He smoked 0.00 packs per day for 42 years. He quit smokeless tobacco use about 33 years ago. He reports that he drinks alcohol. He reports that he does not use illicit drugs.   Family History:  The patient's family history includes Heart disease in his brother; Hypertension in his mother; Peripheral vascular disease in his mother; Stroke in his father.    ROS:  Please see the history of present illness. All other systems are reviewed and negative.    PHYSICAL EXAM: VS:  BP 92/74 mmHg  Pulse 62  Ht  (1.803 m)  Wt 241 lb 6 oz (109.487 kg)  BMI 33.68 kg/m2 , BMI Body mass index is 33.68 kg/(m^2). GEN: Well  nourished, well developed, male in no acute distress HEENT: normal for age  Neck: JVD is not elevated.  Cardiac: RRR; no murmur, no rubs, or gallops Respiratory:  Clear, normal work of breathing GI: soft, nontender, nondistended, + BS MS: no deformity or atrophy; No edema; distal pulses are 2+ in all 4 extremities  Skin: warm and dry, no rash Neuro:  Strength and sensation are intact Psych: euthymic mood, full affect   EKG:  EKG is not ordered today.  Recent Labs: 02/20/2015: ALT 247*    Lipid Panel    Component Value Date/Time   CHOL 168 02/20/2015 0846   TRIG 118 02/20/2015 0846   HDL 43 02/20/2015 0846   CHOLHDL 3.9 02/20/2015 0846   VLDL 24 02/20/2015 0846   LDLCALC 101 02/20/2015 0846     Wt Readings from Last 3 Encounters:  12/24/15 241 lb 6 oz (109.487 kg)  06/21/15 250 lb 8 oz (113.626 kg)  04/05/15 240 lb 4.8 oz (108.999 kg)     Other studies Reviewed: Additional studies/ records that were reviewed today include: Office notes and hospital records.  ASSESSMENT AND PLAN:  1.  Chronic systolic CHF: appears well compensated today. Neck veins are flat. No edema. Weight is down.   We will continue Lasix 20 mg daily. Continue metoprolol and Altace. BP limits titration. He is encouraged to continue daily weights and stick to a low sodium diet.  2. CAD, chest pain: Cardiac cath a year ago - he had a 70% circumflex and a 50% RCA at that time that were treated medically. His chest pain is not typical for angina. He does not wish to do any further testing at this time. He is on aspirin, Zetia, an ACE inhibitor, and a beta blocker.  3. Dyslipidemia. Continue dietary and exercise modification. Not a candidate for statin due to elevated LFTs on low dose therapy.    Current medicines are reviewed at length with the patient today.  The patient does not have concerns regarding medicines.  The following changes have been made:  none  Labs/ tests ordered today include:  No  orders of the defined types were placed in this encounter.     Disposition:   FU with me in 6 months.  Signed, Peter Swaziland, MD  12/24/2015 9:14 AM    [facility]

## 2015-12-24 NOTE — Patient Instructions (Signed)
We will call with the results of your lab work  Continue your current medication  I will see you in 6 months.

## 2015-12-28 ENCOUNTER — Encounter: Payer: Self-pay | Admitting: Pharmacist Clinician (PhC)/ Clinical Pharmacy Specialist

## 2015-12-28 ENCOUNTER — Ambulatory Visit (INDEPENDENT_AMBULATORY_CARE_PROVIDER_SITE_OTHER): Payer: BLUE CROSS/BLUE SHIELD | Admitting: Pharmacist Clinician (PhC)/ Clinical Pharmacy Specialist

## 2015-12-28 VITALS — Ht 71.0 in | Wt 243.2 lb

## 2015-12-28 DIAGNOSIS — E785 Hyperlipidemia, unspecified: Secondary | ICD-10-CM

## 2015-12-28 NOTE — Assessment & Plan Note (Signed)
Because of his lack of prescription coverage, getting a PCSK-9 inhibitor could be difficult to get at an affordable price, based on his income.  We will submit his name to the Research foundation and see if he can get into one of the trials.

## 2015-12-28 NOTE — Patient Instructions (Signed)
Will send your information to the [facility] [employer] regarding cholesterol studies.  You should hear from [place] or [place].  Start fish oil 3 capsules per day.  Get [place] ([employer]) or [employer] for better quality  Cholesterol Cholesterol is a fat. Your body needs a small amount of cholesterol. Cholesterol may build up in your blood vessels. This increases your chance of having a heart attack or stroke. You cannot feel your cholesterol levels. The only way to know your cholesterol level is high is with a blood test. Keep your test results. Work with your doctor to keep your cholesterol at a good level. WHAT DO THE TEST RESULTS MEAN?  Total cholesterol is how much cholesterol is in your blood.  LDL is bad cholesterol. This is the type that can build up. You want LDL to be low.  HDL is good cholesterol. It cleans your blood vessels and carries LDL away. You want HDL to be high.  Triglycerides are fat that the body can burn for energy or store. WHAT ARE GOOD LEVELS OF CHOLESTEROL?  Total cholesterol below 200.  LDL below 100 for people at risk. Below 70 for those at very high risk.  HDL above 50 is good. Above 60 is best.  Triglycerides below 150. HOW CAN I LOWER MY CHOLESTEROL?  Diet. Follow your diet programs as told by your doctor.  Choose fish, white meat chicken, roasted [place], or baked [place]. Try not to eat red meat, fried foods, or processed meats such as sausage and lunch meats.  Eat lots of fresh fruits and vegetables.  Choose whole grains, beans, pasta, potatoes, and cereals.  Use only small amounts of olive, corn, or canola oils.  Try not to eat butter, mayonnaise, shortening, or palm kernel oils.  Try not to eat foods with trans fats.  Drink skim or nonfat milk. Eat low-fat or nonfat yogurt and cheeses. Try not to drink whole milk or cream. Try not to eat ice cream, egg yolks, and full-fat cheeses.  Healthy desserts include angel food cake, ginger snaps,  animal crackers, hard candy, popsicles, and low-fat or nonfat frozen yogurt. Try not to eat pastries, cakes, pies, and cookies.  Exercise. Follow your exercise programs as told by your doctor.  Be more active. You can try gardening, walking, or taking the stairs. Ask your doctor about how you can be more active.  Medicine. Take medicine as told by your doctor.   This information is not intended to replace advice given to you by your health care provider. Make sure you discuss any questions you have with your health care provider.   Document Released: 08/29/2008 Document Revised: 06/23/2014 Document Reviewed: 03/16/2013 Elsevier Interactive Patient Education [employer]2016 Elsevier Inc.

## 2015-12-28 NOTE — Progress Notes (Signed)
12/28/2015 DANA DORNER 07-Dec-1955 [id number]   HPI:  Randy Beasley is a 60 y.o. male patient of Dr Swaziland, who presents today for a lipid clinic evaluation.  He has developed transaminitis with statin drugs and has not been taking anything to help lower his cholesterol since.  He has been trying to improve lifestyle habits in the past couple of months.  He has complained recently about lack of energy and his TSH was found to be elevated.  He is starting a new dose of levothyroxine this weekend.  His medical insurance is thru the Affordable Care Act and he does not have any prescription coverage.  He was unable to afford ezetimbe due to high cost and lack of coverage.  Based on his current LDL, ezetimibe would not get him to goal.   Current Medications: none  Risk Factors: STEMI 04/2014 with DES LAD, ischemic cardiomyopathy  Cholesterol Goals:   LDL < 70; TG < 150   Intolerant/previously tried: atorvastatin 10-80 mg (various times 2013-2015) caused some muscle aches as well as increase in transaminase levels; rosuvastatin 5 mg twice weekly (increased transaminases)  Family history: father had multiple strokes, first in his 104's, died at 39; no other significant history  Diet: has been trying to improve,  Eating wraps with [place], more fruits and vegetabes; sounds like much of his diet is healthier, but still eating too much.  Exercise:  Trying to walk some, doing few wall push ups and other light exercises  Labs:  12/2015 - TC 242, TG 193, HDL 23, LDL 180 02/2015 - TC 168, TG 118, HDL 43, LDL 101  Current Outpatient Prescriptions  Medication Sig Dispense Refill  . Acetaminophen (TYLENOL PO) Take 2 tablets by mouth daily as needed (pain). Pt unsure of strength (08/10/14)    . aspirin 81 MG tablet Take 81 mg by mouth daily.    . busPIRone (BUSPAR) 10 MG tablet Take 10 mg by mouth 3 (three) times daily as needed. Anxiety  10  . clopidogrel (PLAVIX) 75 MG tablet Take 1 tablet (75 mg total)  by mouth daily. 90 tablet 3  . furosemide (LASIX) 20 MG tablet Take 1 tablet (20 mg total) by mouth daily. 30 tablet 11  . levothyroxine (SYNTHROID, LEVOTHROID) 112 MCG tablet Take 112 mcg by mouth daily.    . metoprolol tartrate (LOPRESSOR) 25 MG tablet Take 0.5 tablets (12.5 mg total) by mouth 2 (two) times daily. 90 tablet 3  . Multiple Vitamin (MULTIVITAMIN WITH MINERALS) TABS Take 1 tablet by mouth 3 (three) times a week. Reported on 12/24/2015    . nitroGLYCERIN (NITROSTAT) 0.4 MG SL tablet Place 1 tablet (0.4 mg total) under the tongue every 5 (five) minutes x 3 doses as needed for chest pain. 25 tablet 12  . ramipril (ALTACE) 2.5 MG capsule TAKE ONE CAPSULE BY MOUTH ONE TIME DAILY 90 capsule 2   No current facility-administered medications for this visit.    No Known Allergies  Past Medical History  Diagnosis Date  . Hypothyroidism   . Osteoarthritis   . Anxiety   . Seizures (HCC)     last was > 20 years ago  . STEMI (ST elevation myocardial infarction) (HCC) 04/28/14    with stent to LAD  . CAD (coronary artery disease)     residual disease to LCX and RCA  . Ischemic cardiomyopathy     EF 25-30%  . Abnormal LFTs   . Spinal stenosis   . Chronic systolic CHF (  congestive heart failure) (HCC) 04/05/2015    Height 5\' 11"  (1.803 m), weight 243 lb 3.2 oz (110.315 kg).   ASSESSMENT AND PLAN:  Phillips HayKristin Setsuko Robins PharmD CPP [facility]

## 2016-01-20 ENCOUNTER — Other Ambulatory Visit: Payer: Self-pay | Admitting: Cardiology

## 2016-01-21 NOTE — Telephone Encounter (Signed)
Rx(s) sent to pharmacy electronically.  

## 2016-02-07 ENCOUNTER — Other Ambulatory Visit: Payer: Self-pay | Admitting: Cardiology

## 2016-02-07 NOTE — Telephone Encounter (Signed)
REFILL 

## 2016-04-01 ENCOUNTER — Other Ambulatory Visit: Payer: Self-pay | Admitting: Cardiology

## 2016-04-01 ENCOUNTER — Encounter: Payer: Self-pay | Admitting: *Deleted

## 2016-04-01 NOTE — Telephone Encounter (Signed)
Rx has been sent to the pharmacy electronically. ° °

## 2016-04-01 NOTE — Progress Notes (Unsigned)
Spoke with [name] on 03/31/2016 re: CLEAR Research study screening labs. Labs drawn on Oct. 3 and 10, 2017 both resulted in Liver enzymes being too elevated for participation in the trial. Mar 18, 2016 AST 98 and Alt 102.  Mar 25, 2016 AST 85.  Also gave him the results of a repeat Potassium level.  Labs drawn on Oct. 3, 2017 showed Potassium level of 5.7.  Redrawn local labs by [facility] showed Potassium level of 4.2.

## 2016-06-24 NOTE — Progress Notes (Signed)
Cardiology Office Note   Date:  06/26/2016   ID:  Randy Beasley, DOB 24-Aug-1955, MRN [id number]  PCP:  Randy CanterKELLY, WILLIAM, MD  Cardiologist:  Dr Beasley  Randy SwazilandJordan, MD   Chief Complaint  Patient presents with  . Coronary Artery Disease  . Hyperlipidemia    History of Present Illness: Randy Beasley is a 61 y.o. male with a history of STEMI 04/2014 w/ DES LAD residual disease in LCx of 60-70% and RCA of 50% treated medically, w/ EF 40%>>25%>>35% 07/2014 (refused ICD), remote ETOH & tobacco, transaminitis 2nd statins He [facility] with increased swelling that resolved with lasix.  He is working on his diet and walking. He denies any Etoh or tobacco abuse.   He has developed transaminitis on even low dose Crestor/lipitor. He is unable to afford Zetia. States he has "bare bones" insurance without medication coverage. Was referred to Research but LFTs still too high to qualify for clinical trial.   On follow up today notes one episode of angina about 3 months ago relieved with sl Ntg. Otherwise no significant pain. No dyspnea or edema. Working on losing weight and he has lost 6 lbs.   Past Medical History:  Diagnosis Date  . Abnormal LFTs   . Anxiety   . CAD (coronary artery disease)    residual disease to LCX and RCA  . Chronic systolic CHF (congestive heart failure) (HCC) 04/05/2015  . Hypothyroidism   . Ischemic cardiomyopathy    EF 25-30%  . Osteoarthritis   . Seizures (HCC)    last was > 20 years ago  . Spinal stenosis   . STEMI (ST elevation myocardial infarction) (HCC) 04/28/14   with stent to LAD    Past Surgical History:  Procedure Laterality Date  . APPENDECTOMY    . BACK SURGERY    . CORONARY ANGIOPLASTY WITH STENT PLACEMENT  04/28/14   Promus DES to LAD  . LEFT HEART CATH N/A 04/28/2014   Procedure: LEFT HEART CATH;  Surgeon: Randy M SwazilandJordan, MD;  [facility];  Service: Cardiovascular;  Laterality: N/A;  . NECK  SURGERY    . TONSILLECTOMY      Allergies as of 06/26/2016   No Known Allergies     Medication List       Accurate as of 06/26/16  9:09 AM. Always use your most recent med list.          aspirin 81 MG tablet Take 81 mg by mouth daily.   busPIRone 10 MG tablet Commonly known as:  BUSPAR Take 10 mg by mouth 3 (three) times daily as needed. Anxiety   furosemide 20 MG tablet Commonly known as:  LASIX Take 1 tablet (20 mg total) by mouth daily.   levothyroxine 112 MCG tablet Commonly known as:  SYNTHROID, LEVOTHROID Take 112 mcg by mouth daily.   metoprolol tartrate 25 MG tablet Commonly known as:  LOPRESSOR TAKE HALF-TABLET BY MOUTH TWICE DAILY   multivitamin with minerals Tabs tablet Take 1 tablet by mouth 3 (three) times a week. Reported on 12/24/2015   nitroGLYCERIN 0.4 MG SL tablet Commonly known as:  NITROSTAT Place 1 tablet (0.4 mg total) under the tongue every 5 (five) minutes x 3 doses as needed for chest pain.   ramipril 2.5 MG capsule Commonly known as:  ALTACE TAKE ONE CAPSULE BY MOUTH ONE TIME DAILY   TYLENOL PO Take 2 tablets by mouth daily as needed (  pain). Pt unsure of strength (08/10/14)       Allergies:   Patient has no known allergies.    Social History:  The patient  reports that he quit smoking about 2 years ago. His smoking use included Cigarettes. He smoked 0.00 packs per day for 42.00 years. He quit smokeless tobacco use about 34 years ago. He reports that he drinks alcohol. He reports that he does not use drugs.   Family History:  The patient's family history includes Heart disease in his brother; Hypertension in his mother; Peripheral vascular disease in his mother; Stroke in his father.    ROS:  Please see the history of present illness. All other systems are reviewed and negative.    PHYSICAL EXAM: VS:  BP 116/64   Pulse (!) 58   Ht 5\' 11"  (1.803 m)   Wt 237 lb (107.5 kg)   BMI 33.05 kg/m  , BMI Body mass index is 33.05  kg/m. GEN: Well nourished, well developed, male in no acute distress  HEENT: normal for age  Neck: JVD is not elevated.  Cardiac: RRR; no murmur, no rubs, or gallops Respiratory:  Clear, normal work of breathing GI: soft, nontender, nondistended, + BS MS: no deformity or atrophy; No edema; distal pulses are 2+ in all 4 extremities   Skin: warm and dry, no rash Neuro:  Strength and sensation are intact Psych: euthymic mood, full affect   EKG:  EKG is ordered today. NSR with old septal infarct. No acute change. I have personally reviewed and interpreted this study.   Recent Labs: 12/24/2015: ALT 132; BUN 13; Creat 1.25; Potassium 4.7; Sodium 139; TSH 14.49    Lipid Panel    Component Value Date/Time   CHOL 242 (H) 12/24/2015 0827   TRIG 193 (H) 12/24/2015 0827   HDL 23 (L) 12/24/2015 0827   CHOLHDL 10.5 (H) 12/24/2015 0827   VLDL 39 (H) 12/24/2015 0827   LDLCALC 180 (H) 12/24/2015 0827     Wt Readings from Last 3 Encounters:  06/26/16 237 lb (107.5 kg)  12/28/15 243 lb 3.2 oz (110.3 kg)  12/24/15 241 lb 6 oz (109.5 kg)     Other studies Reviewed: Additional studies/ records that were reviewed today include: Office notes and hospital records.  ASSESSMENT AND PLAN:  1.  Chronic systolic CHF: appears well compensated today. Neck veins are flat. No edema. Weight is down.   We will continue Lasix 20 mg daily. Continue metoprolol and Altace. He is encouraged to continue daily weights and stick to a low sodium diet.  2. CAD, chest pain: s/p DES of LAD in November 2015. He had a 70% circumflex and a 50% RCA at that time that were treated medically. Rare symptoms of angina.  He is on aspirin,  an ACE inhibitor, and a beta blocker. He may stop taking Plavix at this time.  3. Dyslipidemia. Continue dietary and exercise modification. Not a candidate for statin due to elevated LFTs. Not a candidate for Research trial due to elevated transaminases.   Current medicines are reviewed at  length with the patient today.  The patient does not have concerns regarding medicines.  The following changes have been made:  none  Labs/ tests ordered today include:   Orders Placed This Encounter  Procedures  . EKG 12-Lead     Disposition:   FU with me in 6 months.  Signed, Randy Swaziland, MD  06/26/2016 9:09 AM    [facility]

## 2016-06-26 ENCOUNTER — Encounter: Payer: Self-pay | Admitting: Cardiology

## 2016-06-26 ENCOUNTER — Ambulatory Visit (INDEPENDENT_AMBULATORY_CARE_PROVIDER_SITE_OTHER): Payer: BLUE CROSS/BLUE SHIELD | Admitting: Cardiology

## 2016-06-26 VITALS — BP 116/64 | HR 58 | Ht 71.0 in | Wt 237.0 lb

## 2016-06-26 DIAGNOSIS — I2510 Atherosclerotic heart disease of native coronary artery without angina pectoris: Secondary | ICD-10-CM | POA: Diagnosis not present

## 2016-06-26 DIAGNOSIS — I255 Ischemic cardiomyopathy: Secondary | ICD-10-CM | POA: Diagnosis not present

## 2016-06-26 DIAGNOSIS — E785 Hyperlipidemia, unspecified: Secondary | ICD-10-CM

## 2016-06-26 DIAGNOSIS — I5022 Chronic systolic (congestive) heart failure: Secondary | ICD-10-CM | POA: Diagnosis not present

## 2016-06-26 NOTE — Patient Instructions (Signed)
You can stop taking Plavix.  Continue your other therapy.   Keep up with your aerobic exercise  I will see you in 6 months.

## 2016-08-25 ENCOUNTER — Other Ambulatory Visit: Payer: Self-pay | Admitting: Cardiology

## 2016-12-27 NOTE — Progress Notes (Signed)
Cardiology Office Note   Date:  12/29/2016   ID:  Randy Beasley, DOB Oct 06, 1955, MRN [id number]  PCP:  Olivia Canter, MD  Cardiologist:  Dr [place]  Blanche Gallien [place], MD   Chief Complaint  Patient presents with  . Follow-up    6 months;  . Coronary Artery Disease    History of Present Illness: Randy Beasley is a 61 y.o. male with a history of STEMI 04/2014 w/ DES LAD residual disease in LCx of 60-70% and RCA of 50% treated medically, w/ EF 40%>>25%>>35% 07/2014 (refused ICD), remote ETOH & tobacco, transaminitis 2nd statins He was seen by Theodore Demark PAC in October with increased swelling that resolved with lasix.  He is working on his diet and walking. He denies any Etoh or tobacco abuse.   He has developed transaminitis on even low dose Crestor/lipitor. He is unable to afford Zetia. States he has "bare bones" insurance without medication coverage. Was referred to Research but LFTs still too high to qualify for clinical trial.   On follow up today he states he is doing well. Very rare and random episodes of chest pain. Has only used Ntg once in last year. Still walking 2 miles per day but unable to do more due to back pain. Notes Dr. Tresa Endo did lab work last week.   Past Medical History:  Diagnosis Date  . Abnormal LFTs   . Anxiety   . CAD (coronary artery disease)    residual disease to LCX and RCA  . Chronic systolic CHF (congestive heart failure) (HCC) 04/05/2015  . Hypothyroidism   . Ischemic cardiomyopathy    EF 25-30%  . Osteoarthritis   . Seizures (HCC)    last was > 20 years ago  . Spinal stenosis   . STEMI (ST elevation myocardial infarction) (HCC) 04/28/14   with stent to LAD    Past Surgical History:  Procedure Laterality Date  . APPENDECTOMY    . BACK SURGERY    . CORONARY ANGIOPLASTY WITH STENT PLACEMENT  04/28/14   Promus DES to LAD  . LEFT HEART CATH N/A 04/28/2014   Procedure: LEFT HEART CATH;  Surgeon: Ameka Krigbaum M [place], MD;  Location: [facility] CATH  LAB;  Service: Cardiovascular;  Laterality: N/A;  . NECK SURGERY    . TONSILLECTOMY      Allergies as of 12/29/2016   No Known Allergies     Medication List       Accurate as of 12/29/16  8:58 AM. Always use your most recent med list.          aspirin 81 MG tablet Take 81 mg by mouth daily.   busPIRone 10 MG tablet Commonly known as:  BUSPAR Take 10 mg by mouth 3 (three) times daily as needed. Anxiety   furosemide 20 MG tablet Commonly known as:  LASIX Take 1 tablet (20 mg total) by mouth daily.   levothyroxine 112 MCG tablet Commonly known as:  SYNTHROID, LEVOTHROID Take 112 mcg by mouth daily.   metoprolol tartrate 25 MG tablet Commonly known as:  LOPRESSOR TAKE HALF-TABLET BY MOUTH TWICE DAILY   multivitamin with minerals Tabs tablet Take 1 tablet by mouth 3 (three) times a week. Reported on 12/24/2015   nitroGLYCERIN 0.4 MG SL tablet Commonly known as:  NITROSTAT Place 1 tablet (0.4 mg total) under the tongue every 5 (five) minutes x 3 doses as needed for chest pain.   ramipril 2.5 MG capsule Commonly known as:  ALTACE TAKE  ONE CAPSULE BY MOUTH ONE TIME DAILY   TYLENOL PO Take 2 tablets by mouth daily as needed (pain). Pt unsure of strength (08/10/14)       Allergies:   Patient has no known allergies.    Social History:  The patient  reports that he quit smoking about 3 years ago. His smoking use included Cigarettes. He smoked 0.00 packs per day for 42.00 years. He quit smokeless tobacco use about 34 years ago. He reports that he drinks alcohol. He reports that he does not use drugs.   Family History:  The patient's family history includes Heart disease in his brother; Hypertension in his mother; Peripheral vascular disease in his mother; Stroke in his father.    ROS:  Please see the history of present illness. All other systems are reviewed and negative.    PHYSICAL EXAM: VS:  BP 111/82   Pulse 62   Ht 5\' 11"  (1.803 m)   Wt 236 lb 9.6 oz (107.3 kg)    BMI 33.00 kg/m  , BMI Body mass index is 33 kg/m. GEN: Well nourished, well developed, male in no acute distress  HEENT: normal for age  Neck: JVD is not elevated.  Cardiac: RRR; no murmur, no rubs, or gallops Respiratory:  Clear, normal work of breathing GI: soft, nontender, nondistended, + BS MS: no deformity or atrophy; No edema; distal pulses are 2+ in all 4 extremities   Skin: warm and dry, no rash Neuro:  Strength and sensation are intact Psych: euthymic mood, full affect   EKG:  EKG is not ordered today.   Recent Labs: No results found for requested labs within last 8760 hours.    Lipid Panel    Component Value Date/Time   CHOL 242 (H) 12/24/2015 0827   TRIG 193 (H) 12/24/2015 0827   HDL 23 (L) 12/24/2015 0827   CHOLHDL 10.5 (H) 12/24/2015 0827   VLDL 39 (H) 12/24/2015 0827   LDLCALC 180 (H) 12/24/2015 0827     Wt Readings from Last 3 Encounters:  12/29/16 236 lb 9.6 oz (107.3 kg)  06/26/16 237 lb (107.5 kg)  12/28/15 243 lb 3.2 oz (110.3 kg)     Other studies Reviewed: Additional studies/ records that were reviewed today include: Office notes and hospital records.  ASSESSMENT AND PLAN:  1.  Chronic systolic CHF: appears well compensated today. Neck veins are flat. No edema. Weight is down.   We will continue Lasix 20 mg daily. Continue metoprolol and Altace. He is encouraged to continue daily weights and stick to a low sodium diet.  2. CAD, chest pain: s/p DES of LAD in November 2015. He had a 70% circumflex and a 50% RCA at that time that were treated medically. Rare random symptoms of angina.  He is on aspirin,  an ACE inhibitor, and a beta blocker. Continue aerobic exercise.  3. Dyslipidemia. Continue dietary and exercise modification. Not a candidate for statin due to elevated LFTs. Not a candidate for Research trial due to elevated transaminases. Will request a copy of his recent lab work.   Current medicines are reviewed at length with the patient  today.  The patient does not have concerns regarding medicines.  The following changes have been made:  none  Labs/ tests ordered today include:   No orders of the defined types were placed in this encounter.    Disposition:   FU with me in 6 months.  Signed, Randy Partin [place], MD  12/29/2016 8:58 AM  [facility]

## 2016-12-28 ENCOUNTER — Other Ambulatory Visit: Payer: Self-pay | Admitting: Cardiology

## 2016-12-29 ENCOUNTER — Other Ambulatory Visit: Payer: Self-pay

## 2016-12-29 ENCOUNTER — Encounter: Payer: Self-pay | Admitting: Cardiology

## 2016-12-29 ENCOUNTER — Ambulatory Visit (INDEPENDENT_AMBULATORY_CARE_PROVIDER_SITE_OTHER): Payer: Medicare Other | Admitting: Cardiology

## 2016-12-29 VITALS — BP 111/82 | HR 62 | Ht 71.0 in | Wt 236.6 lb

## 2016-12-29 DIAGNOSIS — I5022 Chronic systolic (congestive) heart failure: Secondary | ICD-10-CM | POA: Diagnosis not present

## 2016-12-29 DIAGNOSIS — I2510 Atherosclerotic heart disease of native coronary artery without angina pectoris: Secondary | ICD-10-CM | POA: Diagnosis not present

## 2016-12-29 DIAGNOSIS — E7800 Pure hypercholesterolemia, unspecified: Secondary | ICD-10-CM | POA: Diagnosis not present

## 2016-12-29 MED ORDER — NITROGLYCERIN 0.4 MG SL SUBL: 0.4000 mg | SUBLINGUAL_TABLET | SUBLINGUAL | 12 refills | Status: DC | PRN

## 2016-12-29 MED ORDER — RAMIPRIL 2.5 MG PO CAPS: 2.5000 mg | ORAL_CAPSULE | Freq: Every day | ORAL | 3 refills | Status: DC

## 2016-12-29 MED ORDER — FUROSEMIDE 20 MG PO TABS: 20.0000 mg | ORAL_TABLET | Freq: Every day | ORAL | 3 refills | Status: DC

## 2016-12-29 MED ORDER — METOPROLOL TARTRATE 25 MG PO TABS: ORAL_TABLET | ORAL | 3 refills | Status: DC

## 2016-12-29 NOTE — Patient Instructions (Signed)
Continue your current therapy  We will request a copy of your lab work from Dr. Kelly  I will see you in 6 months 

## 2017-02-28 ENCOUNTER — Other Ambulatory Visit: Payer: Self-pay | Admitting: Cardiology

## 2017-03-02 NOTE — Telephone Encounter (Signed)
Rx has been sent to the pharmacy electronically. ° °

## 2017-06-17 DIAGNOSIS — I255 Ischemic cardiomyopathy: Secondary | ICD-10-CM | POA: Insufficient documentation

## 2017-07-08 NOTE — Progress Notes (Signed)
Cardiology Office Note   Date:  07/14/2017   ID:  Randy Beasley, DOB [birth year], MRN [id number]  PCP:  Pearson ForsterKelly, William S, MD  Cardiologist:  Dr Beasley  Peter [place], MD   Chief Complaint  Patient presents with  . Follow-up  . Coronary Artery Disease    History of Present Illness: Randy Beasley is a 62 y.o. male with a history of STEMI 04/2014 w/ DES LAD residual disease in LCx of 60-70% and RCA of 50% treated medically, w/ EF 40%>>25%>>35% 07/2014 (refused ICD), remote ETOH & tobacco, transaminitis 2nd statins  He has developed transaminitis on even low dose Crestor/lipitor. He was previously unable  to afford Zetia. States he now has Medicare D coverage with [employer].   Was referred to Research previously but LFTs still too high to qualify for clinical trial.   On follow up today he states he is doing well. No symptoms  of chest pain.Breathing is good and he has no swelling.  Not as active with the cold weather and has gained weight. Limited by chronic back pain.   Past Medical History:  Diagnosis Date  . Abnormal LFTs   . Anxiety   . CAD (coronary artery disease)    residual disease to LCX and RCA  . Chronic systolic CHF (congestive heart failure) (HCC) 04/05/2015  . Hypothyroidism   . Ischemic cardiomyopathy    EF 25-30%  . Osteoarthritis   . Seizures (HCC)    last was > 20 years ago  . Spinal stenosis   . STEMI (ST elevation myocardial infarction) (HCC) 04/28/14   with stent to LAD    Past Surgical History:  Procedure Laterality Date  . APPENDECTOMY    . BACK SURGERY    . CORONARY ANGIOPLASTY WITH STENT PLACEMENT  04/28/14   Promus DES to LAD  . LEFT HEART CATH N/A 04/28/2014   Procedure: LEFT HEART CATH;  Surgeon: Peter M [place], MD;  [facility];  Service: Cardiovascular;  Laterality: N/A;  . NECK SURGERY    . TONSILLECTOMY      Allergies as of 07/14/2017   No Known Allergies     Medication List        Accurate as of 07/14/17  9:31 AM.  Always use your most recent med list.          aspirin 81 MG tablet Take 81 mg by mouth daily.   busPIRone 10 MG tablet Commonly known as:  BUSPAR Take 10 mg by mouth 3 (three) times daily as needed. Anxiety   furosemide 20 MG tablet Commonly known as:  LASIX Take 1 tablet (20 mg total) by mouth daily.   levothyroxine 112 MCG tablet Commonly known as:  SYNTHROID, LEVOTHROID Take 112 mcg by mouth daily.   metoprolol tartrate 25 MG tablet Commonly known as:  LOPRESSOR TAKE HALF-TABLET BY MOUTH TWICE DAILY   metoprolol tartrate 25 MG tablet Commonly known as:  LOPRESSOR TAKE ONE-HALF TABLET BY MOUTH TWICE DAILY   multivitamin with minerals Tabs tablet Take 1 tablet by mouth 3 (three) times a week. Reported on 12/24/2015   nitroGLYCERIN 0.4 MG SL tablet Commonly known as:  NITROSTAT Place 1 tablet (0.4 mg total) under the tongue every 5 (five) minutes x 3 doses as needed for chest pain.   ramipril 5 MG capsule Commonly known as:  ALTACE Take 1 capsule (5 mg total) by mouth daily.   TYLENOL PO Take 2 tablets by mouth daily as needed (pain).  Pt unsure of strength (08/10/14)       Allergies:   Patient has no known allergies.    Social History:  The patient  reports that he quit smoking about 3 years ago. His smoking use included cigarettes. He smoked 0.00 packs per day for 42.00 years. He quit smokeless tobacco use about 35 years ago. He reports that he drinks alcohol. He reports that he does not use drugs.   Family History:  The patient's family history includes Heart disease in his brother; Hypertension in his mother; Peripheral vascular disease in his mother; Stroke in his father.    ROS:  Please see the history of present illness. All other systems are reviewed and negative.    PHYSICAL EXAM: VS:  BP 132/90   Pulse 69   Ht 5\' 10"  (1.778 m)   Wt 243 lb (110.2 kg)   BMI 34.87 kg/m  , BMI Body mass index is 34.87 kg/m. GENERAL:  Well appearing overweight WM    HEENT:  PERRL, EOMI, sclera are clear. Oropharynx is clear. NECK:  No jugular venous distention, carotid upstroke brisk and symmetric, no bruits, no thyromegaly or adenopathy LUNGS:  Clear to auscultation bilaterally CHEST:  Unremarkable HEART:  RRR,  PMI not displaced or sustained,S1 and S2 within normal limits, no S3, no S4: no clicks, no rubs, no murmurs ABD:  Soft, nontender. BS +, no masses or bruits. No hepatomegaly, no splenomegaly EXT:  2 + pulses throughout, no edema, no cyanosis no clubbing SKIN:  Warm and dry.  No rashes NEURO:  Alert and oriented x 3. Cranial nerves II through XII intact. PSYCH:  Cognitively intact     EKG:  EKG is  ordered today. NSR with old septal infarct. No acute change. I have personally reviewed and interpreted this study.   Recent Labs: No results found for requested labs within last 8760 hours.    Lipid Panel    Component Value Date/Time   CHOL 242 (H) 12/24/2015 0827   TRIG 193 (H) 12/24/2015 0827   HDL 23 (L) 12/24/2015 0827   CHOLHDL 10.5 (H) 12/24/2015 0827   VLDL 39 (H) 12/24/2015 0827   LDLCALC 180 (H) 12/24/2015 0827     Wt Readings from Last 3 Encounters:  07/14/17 243 lb (110.2 kg)  12/29/16 236 lb 9.6 oz (107.3 kg)  06/26/16 237 lb (107.5 kg)     Other studies Reviewed: Additional studies/ records that were reviewed today include:  Labs dated 12/24/16: cholesterol 231, triglycerides 133, HDL 43, LDL 161. AST 57, ALT 56. Creatinine 1.37. Other chemistries, TSH, CBC normal.   ASSESSMENT AND PLAN:  1.  Chronic systolic CHF: appears well compensated today. Neck veins are flat. No edema. Weight is up but more from calories not edema.   We will continue Lasix 20 mg daily. Continue metoprolol. Increase Altace to 5 mg daily. Check BMET in 2 weeks.  Low sodium diet.   2. CAD, chest pain: s/p DES of LAD in November 2015. He had a 70% circumflex and a 50% RCA at that time that were treated medically. Rare random symptoms of angina.   He is on aspirin,  an ACE inhibitor, and a beta blocker. Increase aerobic exercise.  3. Dyslipidemia. Continue dietary and exercise modification. Not a candidate for statin due to elevated LFTs. Not a candidate for Research trial due to elevated transaminases. Will request pharmacy to review options of Zetia or PCSK 9 inhibitor since he now has Part D coverage.  Current medicines are reviewed at length with the patient today.  The patient does not have concerns regarding medicines.  The following changes have been made:  none  Labs/ tests ordered today include:   Orders Placed This Encounter  Procedures  . EKG 12-Lead     Disposition:   FU with me in 6 months.  Signed, Peter [place], MD  07/14/2017 9:31 AM    [facility]

## 2017-07-14 ENCOUNTER — Encounter: Payer: Self-pay | Admitting: Cardiology

## 2017-07-14 ENCOUNTER — Ambulatory Visit (INDEPENDENT_AMBULATORY_CARE_PROVIDER_SITE_OTHER): Payer: Medicare HMO | Admitting: Cardiology

## 2017-07-14 VITALS — BP 132/90 | HR 69 | Ht 70.0 in | Wt 243.0 lb

## 2017-07-14 DIAGNOSIS — I5022 Chronic systolic (congestive) heart failure: Secondary | ICD-10-CM

## 2017-07-14 DIAGNOSIS — I2510 Atherosclerotic heart disease of native coronary artery without angina pectoris: Secondary | ICD-10-CM | POA: Diagnosis not present

## 2017-07-14 DIAGNOSIS — E7800 Pure hypercholesterolemia, unspecified: Secondary | ICD-10-CM

## 2017-07-14 MED ORDER — RAMIPRIL 5 MG PO CAPS: 5.0000 mg | ORAL_CAPSULE | Freq: Every day | ORAL | 3 refills | Status: DC

## 2017-07-14 NOTE — Patient Instructions (Addendum)
Increase ramipril to 5 mg daily. We will need to check your kidney function in a couple of weeks.  Continue your other therapy  I will see you in 6 months

## 2017-07-28 LAB — BASIC METABOLIC PANEL
BUN/Creatinine Ratio: 13 (ref 10–24)
BUN: 17 mg/dL (ref 8–27)
CALCIUM: 9.8 mg/dL (ref 8.6–10.2)
CHLORIDE: 103 mmol/L (ref 96–106)
CO2: 23 mmol/L (ref 20–29)
Creatinine, Ser: 1.28 mg/dL — ABNORMAL HIGH (ref 0.76–1.27)
GFR calc Af Amer: 68 mL/min/{1.73_m2} (ref >59)
GFR calc non Af Amer: 59 mL/min/{1.73_m2} — ABNORMAL LOW (ref >59)
GLUCOSE: 94 mg/dL (ref 65–99)
POTASSIUM: 4.8 mmol/L (ref 3.5–5.2)
SODIUM: 142 mmol/L (ref 134–144)

## 2017-07-29 ENCOUNTER — Telehealth: Payer: Self-pay | Admitting: Pharmacist

## 2017-07-29 NOTE — Telephone Encounter (Signed)
LMOM; Patient to call back to discuss options to improve lipid management.  May start ezetimibe 10mg  daily ASAP and/or omega-3   Lipids blood work > 64 year old. Will need new blood work if patient interested on PCSK9i.

## 2017-12-07 ENCOUNTER — Other Ambulatory Visit: Payer: Self-pay | Admitting: Cardiology

## 2017-12-07 NOTE — Telephone Encounter (Signed)
Rx request sent to pharmacy.  

## 2018-02-19 NOTE — Progress Notes (Signed)
Cardiology Office Note   Date:  02/22/2018   ID:  Randy Beasley, DOB 01-Jun-1956, MRN [id number]  PCP:  Pearson Forster, MD  Cardiologist:  Dr [place]  [name], MD   Chief Complaint  Patient presents with  . Follow-up    History of Present Illness: Randy Beasley is a 62 y.o. male with a history of STEMI 04/2014 w/ DES LAD residual disease in LCx of 60-70% and RCA of 50% treated medically, w/ EF 40%>>25%>>35% 07/2014 (refused ICD), remote ETOH & tobacco, transaminitis 2nd statins.  He has developed transaminitis on even low dose Crestor/lipitor. He has been unable  to afford Zetia. On and off again insurance coverage.   Was referred to Research previously but LFTs still too high to qualify for clinical trial.  On follow up today he states he is doing well from a cardiac standpoint. Rare  symptoms  of chest pain- and just a twinge at that. Breathing is good and he has no swelling.  Activity is very limited by back pain. At most walks < 1 mile.  Reports lab work done in July by Dr. Tresa Endo.   Past Medical History:  Diagnosis Date  . Abnormal LFTs   . Anxiety   . CAD (coronary artery disease)    residual disease to LCX and RCA  . Chronic systolic CHF (congestive heart failure) (HCC) 04/05/2015  . Hypothyroidism   . Ischemic cardiomyopathy    EF 25-30%  . Osteoarthritis   . Seizures (HCC)    last was > 20 years ago  . Spinal stenosis   . STEMI (ST elevation myocardial infarction) (HCC) 04/28/14   with stent to LAD    Past Surgical History:  Procedure Laterality Date  . APPENDECTOMY    . BACK SURGERY    . CORONARY ANGIOPLASTY WITH STENT PLACEMENT  04/28/14   Promus DES to LAD  . LEFT HEART CATH N/A 04/28/2014   Procedure: LEFT HEART CATH;  Surgeon: Keandrea Tapley M [place], MD;  Location: [facility] CATH LAB;  Service: Cardiovascular;  Laterality: N/A;  . NECK SURGERY    . TONSILLECTOMY      Allergies as of 02/22/2018   No Known Allergies     Medication List        Accurate  as of 02/22/18  8:14 AM. Always use your most recent med list.          aspirin 81 MG tablet Take 81 mg by mouth daily.   busPIRone 10 MG tablet Commonly known as:  BUSPAR Take 10 mg by mouth 3 (three) times daily as needed. Anxiety   furosemide 20 MG tablet Commonly known as:  LASIX TAKE 1 TABLET BY MOUTH EVERY DAY   levothyroxine 112 MCG tablet Commonly known as:  SYNTHROID, LEVOTHROID Take 112 mcg by mouth daily.   metoprolol tartrate 25 MG tablet Commonly known as:  LOPRESSOR TAKE HALF-TABLET BY MOUTH TWICE DAILY   multivitamin with minerals Tabs tablet Take 1 tablet by mouth 3 (three) times a week. Reported on 12/24/2015   nitroGLYCERIN 0.4 MG SL tablet Commonly known as:  NITROSTAT Place 1 tablet (0.4 mg total) under the tongue every 5 (five) minutes x 3 doses as needed for chest pain.   ramipril 5 MG capsule Commonly known as:  ALTACE Take 1 capsule (5 mg total) by mouth daily.   TYLENOL PO Take 2 tablets by mouth daily as needed (pain). Pt unsure of strength (08/10/14)  Allergies:   Patient has no known allergies.    Social History:  The patient  reports that he quit smoking about 4 years ago. His smoking use included cigarettes. He smoked 0.00 packs per day for 42.00 years. He quit smokeless tobacco use about 36 years ago. He reports that he drinks about 3.0 - 5.0 standard drinks of alcohol per week. He reports that he does not use drugs.   Family History:  The patient's family history includes Heart disease in his brother; Hypertension in his mother; Peripheral vascular disease in his mother; Stroke in his father.    ROS:  Please see the history of present illness. All other systems are reviewed and negative.    PHYSICAL EXAM: VS:  BP 120/80   Pulse 68   Ht 5\' 11"  (1.803 m)   Wt 249 lb 3.2 oz (113 kg)   BMI 34.76 kg/m  , BMI Body mass index is 34.76 kg/m. GENERAL:  Well appearing obese WM in NAD HEENT:  PERRL, EOMI, sclera are clear. Oropharynx  is clear. NECK:  No jugular venous distention, carotid upstroke brisk and symmetric, no bruits, no thyromegaly or adenopathy LUNGS:  Clear to auscultation bilaterally CHEST:  Unremarkable HEART:  RRR,  PMI not displaced or sustained,S1 and S2 within normal limits, no S3, no S4: no clicks, no rubs, no murmurs ABD:  Soft, nontender. BS +, no masses or bruits. No hepatomegaly, no splenomegaly EXT:  2 + pulses throughout, no edema, no cyanosis no clubbing SKIN:  Warm and dry.  No rashes NEURO:  Alert and oriented x 3. Cranial nerves II through XII intact. PSYCH:  Cognitively intact    EKG:  EKG is not  ordered today.   Recent Labs: 07/28/2017: BUN 17; Creatinine, Ser 1.28; Potassium 4.8; Sodium 142    Lipid Panel    Component Value Date/Time   CHOL 242 (H) 12/24/2015 0827   TRIG 193 (H) 12/24/2015 0827   HDL 23 (L) 12/24/2015 0827   CHOLHDL 10.5 (H) 12/24/2015 0827   VLDL 39 (H) 12/24/2015 0827   LDLCALC 180 (H) 12/24/2015 0827     Wt Readings from Last 3 Encounters:  02/22/18 249 lb 3.2 oz (113 kg)  07/14/17 243 lb (110.2 kg)  12/29/16 236 lb 9.6 oz (107.3 kg)     Other studies Reviewed: Additional studies/ records that were reviewed today include:  Labs dated 12/24/16: cholesterol 231, triglycerides 133, HDL 43, LDL 161. AST 57, ALT 56. Creatinine 1.37. Other chemistries, TSH, CBC normal.   ASSESSMENT AND PLAN:  1.  Chronic systolic CHF: appears well compensated today.  Weight is up 6 lbs but more from calories and inactivity not edema.   We will continue Lasix 20 mg daily. Continue metoprolol and ACEi.   Low sodium diet.   2. CAD, chest pain: s/p DES of LAD in November 2015. He had a 70% circumflex and a 50% RCA at that time that were treated medically. No significant angina.  He is on aspirin,  an ACE inhibitor, and a beta blocker. Increase aerobic exercise.  3. Dyslipidemia. Continue dietary and exercise modification. Not a candidate for statin due to elevated LFTs. Not a  candidate for Research trial due to elevated transaminases. Unable to afford Zetia or PCSK 9 inhibitor.    Current medicines are reviewed at length with the patient today.  The patient does not have concerns regarding medicines.  The following changes have been made:  none  Labs/ tests ordered today include:  No orders of the defined types were placed in this encounter.    Disposition:   FU with me in 6 months.  Signed, Hennie Gosa [place], MD  02/22/2018 8:14 AM    [facility]

## 2018-02-22 ENCOUNTER — Ambulatory Visit: Payer: Medicare HMO | Admitting: Cardiology

## 2018-02-22 ENCOUNTER — Encounter: Payer: Self-pay | Admitting: Cardiology

## 2018-02-22 ENCOUNTER — Encounter

## 2018-02-22 VITALS — BP 120/80 | HR 68 | Ht 71.0 in | Wt 249.2 lb

## 2018-02-22 DIAGNOSIS — E7800 Pure hypercholesterolemia, unspecified: Secondary | ICD-10-CM

## 2018-02-22 DIAGNOSIS — I5022 Chronic systolic (congestive) heart failure: Secondary | ICD-10-CM

## 2018-02-22 DIAGNOSIS — I2510 Atherosclerotic heart disease of native coronary artery without angina pectoris: Secondary | ICD-10-CM

## 2018-02-22 NOTE — Patient Instructions (Signed)
Continue your current therapy  We will request a copy of your lab work from Dr. Tresa Endo  I will see you in 6 months

## 2018-03-07 ENCOUNTER — Other Ambulatory Visit: Payer: Self-pay | Admitting: Cardiology

## 2018-07-16 ENCOUNTER — Other Ambulatory Visit: Payer: Self-pay | Admitting: Cardiology

## 2018-07-16 NOTE — Telephone Encounter (Signed)
Rx(s) sent to pharmacy electronically.  

## 2018-09-08 ENCOUNTER — Telehealth: Payer: Self-pay

## 2018-09-08 NOTE — Telephone Encounter (Signed)
   Primary Cardiologist:  [name].   Patient contacted.  History reviewed.  No symptoms to suggest any unstable cardiac conditions.  Based on discussion, with current pandemic situation, we will be postponing this 09/15/18 appointment for Randy Beasley with a plan for f/u 11/22/18 at 3:20 pm. If symptoms change, he has been instructed to contact our office.     Neoma Laming, LPN  07/30/863 7:84 PM         .

## 2018-09-15 ENCOUNTER — Ambulatory Visit: Payer: Medicare HMO | Admitting: Cardiology

## 2018-11-18 ENCOUNTER — Telehealth: Payer: Self-pay | Admitting: Cardiology

## 2018-11-18 NOTE — Telephone Encounter (Signed)
Mychart sent via email, no smartphone, consent (verbal), pre reg complete 11/18/18 AF

## 2018-11-18 NOTE — Progress Notes (Signed)
Virtual Visit via Telephone Note   This visit type was conducted due to national recommendations for restrictions regarding the COVID-19 Pandemic (e.g. social distancing) in an effort to limit this patient's exposure and mitigate transmission in our community.  Due to his co-morbid illnesses, this patient is at least at moderate risk for complications without adequate follow up.  This format is felt to be most appropriate for this patient at this time.  The patient did not have access to video technology/had technical difficulties with video requiring transitioning to audio format only (telephone).  All issues noted in this document were discussed and addressed.  No physical exam could be performed with this format.  Please refer to the patient's chart for his  consent to telehealth [facility].   Date:  11/22/2018   ID:  Randy Beasley, DOB 07-Mar-1956, MRN [id number]  Patient Location: Home Provider Location: Office  PCP:  Pearson ForsterKelly, William S, MD  Cardiologist:  Peter SwazilandJordan MD Electrophysiologist:  None   Evaluation Performed:  Follow-Up Visit  Chief Complaint:  Follow up CAD  History of Present Illness:    Randy CottonCarlos N Privott is a 63 y.o. male with a history of STEMI 04/2014 w/ DES LAD residual disease in LCx of 60-70% and RCA of 50% treated medically, w/ EF 40%>>25%>>35% 07/2014 (refused ICD), remote ETOH & tobacco, transaminitis 2nd statins.  He has developed transaminitis on even low dose Crestor/lipitor. He has been unable  to afford Zetia. On and off again insurance coverage.   Was referred to Research previously but LFTs still too high to qualify for clinical trial.  He reports he is doing well. He had a couple of bad spells of indigestion after eating old pizza late at night. Otherwise no chest pain, palpitations, or dyspnea. Exercise is limited by back pain.   The patient does not have symptoms concerning for COVID-19 infection (fever, chills, cough, or new shortness of  breath).    Past Medical History:  Diagnosis Date  . Abnormal LFTs   . Anxiety   . CAD (coronary artery disease)    residual disease to LCX and RCA  . Chronic systolic CHF (congestive heart failure) (HCC) 04/05/2015  . Hypothyroidism   . Ischemic cardiomyopathy    EF 25-30%  . Osteoarthritis   . Seizures (HCC)    last was > 20 years ago  . Spinal stenosis   . STEMI (ST elevation myocardial infarction) (HCC) 04/28/14   with stent to LAD   Past Surgical History:  Procedure Laterality Date  . APPENDECTOMY    . BACK SURGERY    . CORONARY ANGIOPLASTY WITH STENT PLACEMENT  04/28/14   Promus DES to LAD  . LEFT HEART CATH N/A 04/28/2014   Procedure: LEFT HEART CATH;  Surgeon: Peter M SwazilandJordan, MD;  [facility];  Service: Cardiovascular;  Laterality: N/A;  . NECK SURGERY    . TONSILLECTOMY       Current Meds  Medication Sig  . Acetaminophen (TYLENOL PO) Take 2 tablets by mouth daily as needed (pain). Pt unsure of strength (08/10/14)  . aspirin 81 MG tablet Take 81 mg by mouth daily.  . busPIRone (BUSPAR) 10 MG tablet Take 10 mg by mouth 3 (three) times daily as needed. Anxiety  . furosemide (LASIX) 20 MG tablet TAKE 1 TABLET BY MOUTH EVERY DAY  . metoprolol tartrate (LOPRESSOR) 25 MG tablet TAKE ONE-HALF TABLET BY MOUTH TWICE DAILY  . nitroGLYCERIN (NITROSTAT) 0.4 MG SL tablet Place  1 tablet (0.4 mg total) under the tongue every 5 (five) minutes x 3 doses as needed for chest pain.  . ramipril (ALTACE) 5 MG capsule Take 1 capsule (5 mg total) by mouth daily.  . [DISCONTINUED] levothyroxine (SYNTHROID, LEVOTHROID) 112 MCG tablet Take 112 mcg by mouth daily.  . [DISCONTINUED] nitroGLYCERIN (NITROSTAT) 0.4 MG SL tablet Place 1 tablet (0.4 mg total) under the tongue every 5 (five) minutes x 3 doses as needed for chest pain.     Allergies:   Patient has no known allergies.   Social History   Tobacco Use  . Smoking status: Former Smoker    Packs/day: 0.00    Years: 42.00     Pack years: 0.00    Types: Cigarettes    Last attempt to quit: 07/29/2013    Years since quitting: 5.3  . Smokeless tobacco: Former Neurosurgeon    Quit date: 02/14/1982  Substance Use Topics  . Alcohol use: Yes    Alcohol/week: 3.0 - 5.0 standard drinks    Types: 3 - 5 Cans of beer per week    Comment: previously a case of beer a week, 3-4 beers a day, 5 years.  Has not drank in several weeks.  . Drug use: No     Family Hx: The patient's family history includes Heart disease in his brother; Hypertension in his mother; Peripheral vascular disease in his mother; Stroke in his father.  ROS:   Please see the history of present illness.    All other systems reviewed and are negative.   Prior CV studies:   The following studies were reviewed today:  none  Labs/Other Tests and Data Reviewed:    EKG:  No ECG reviewed.  Recent Labs: No results found for requested labs within last 8760 hours.   Recent Lipid Panel Lab Results  Component Value Date/Time   CHOL 242 (H) 12/24/2015 08:27 AM   TRIG 193 (H) 12/24/2015 08:27 AM   HDL 23 (L) 12/24/2015 08:27 AM   CHOLHDL 10.5 (H) 12/24/2015 08:27 AM   LDLCALC 180 (H) 12/24/2015 08:27 AM    Wt Readings from Last 3 Encounters:  02/22/18 249 lb 3.2 oz (113 kg)  07/14/17 243 lb (110.2 kg)  12/29/16 236 lb 9.6 oz (107.3 kg)     Objective:    Vital Signs:  Ht  (1.803 m)   BMI 34.76 kg/m    VITAL SIGNS:  reviewed  ASSESSMENT & PLAN:    1.  Chronic systolic CHF: patient reports weight is down and he has no edema.   We will continue Lasix 20 mg daily. Continue metoprolol and ACEi.   Low sodium diet.   2. CAD, chest pain: s/p DES of LAD in November 2015. He had a 70% circumflex and a 50% RCA at that time that were treated medically. No significant angina.  He is on aspirin,  an ACE inhibitor, and a beta blocker. No significant angina.   3. Dyslipidemia. Continue dietary and exercise modification. Not a candidate for statin due to  elevated LFTs. Not a candidate for Research trial due to elevated transaminases. Unable to afford Zetia or PCSK 9 inhibitor. May be a candidate for one of the newer agents once on the market depending on cost.    COVID-19 Education: The signs and symptoms of COVID-19 were discussed with the patient and how to seek care for testing (follow up with PCP or arrange E-visit).  The importance of social distancing was discussed today.  Time:   Today, I have spent 10 minutes with the patient with telehealth technology discussing the above problems.     Medication Adjustments/Labs and Tests Ordered: Current medicines are reviewed at length with the patient today.  Concerns regarding medicines are outlined above.   Tests Ordered: No orders of the defined types were placed in this encounter.   Medication Changes: Meds ordered this encounter  Medications  . nitroGLYCERIN (NITROSTAT) 0.4 MG SL tablet    Sig: Place 1 tablet (0.4 mg total) under the tongue every 5 (five) minutes x 3 doses as needed for chest pain.    Dispense:  25 tablet    Refill:  12    Disposition:  Follow up in 6 month(s)  Signed, Peter Swaziland, MD  11/22/2018 3:05 PM    [facility]

## 2018-11-22 ENCOUNTER — Other Ambulatory Visit: Payer: Self-pay

## 2018-11-22 ENCOUNTER — Encounter: Payer: Self-pay | Admitting: Cardiology

## 2018-11-22 ENCOUNTER — Telehealth (INDEPENDENT_AMBULATORY_CARE_PROVIDER_SITE_OTHER): Payer: Medicare HMO | Admitting: Cardiology

## 2018-11-22 VITALS — Ht 71.0 in

## 2018-11-22 DIAGNOSIS — I5022 Chronic systolic (congestive) heart failure: Secondary | ICD-10-CM

## 2018-11-22 DIAGNOSIS — I2510 Atherosclerotic heart disease of native coronary artery without angina pectoris: Secondary | ICD-10-CM

## 2018-11-22 DIAGNOSIS — E7800 Pure hypercholesterolemia, unspecified: Secondary | ICD-10-CM

## 2018-11-22 MED ORDER — NITROGLYCERIN 0.4 MG SL SUBL: 0.4000 mg | SUBLINGUAL_TABLET | SUBLINGUAL | 12 refills | Status: AC | PRN

## 2018-11-22 NOTE — Patient Instructions (Signed)

## 2018-11-26 ENCOUNTER — Other Ambulatory Visit: Payer: Self-pay | Admitting: Cardiology

## 2019-04-08 ENCOUNTER — Other Ambulatory Visit: Payer: Self-pay | Admitting: Cardiology

## 2019-05-03 ENCOUNTER — Emergency Department (HOSPITAL_COMMUNITY): Payer: Medicare HMO

## 2019-05-03 ENCOUNTER — Other Ambulatory Visit: Payer: Self-pay

## 2019-05-03 ENCOUNTER — Inpatient Hospital Stay (HOSPITAL_COMMUNITY)
Admission: EM | Admit: 2019-05-03 | Discharge: 2019-05-15 | DRG: 418 | Disposition: A | Discharge status: Home / Self Care | Payer: Medicare HMO | Attending: Family Medicine | Admitting: Family Medicine

## 2019-05-03 ENCOUNTER — Encounter (HOSPITAL_COMMUNITY): Payer: Self-pay | Admitting: Radiology

## 2019-05-03 DIAGNOSIS — K7460 Unspecified cirrhosis of liver: Secondary | ICD-10-CM | POA: Diagnosis present

## 2019-05-03 DIAGNOSIS — I255 Ischemic cardiomyopathy: Secondary | ICD-10-CM | POA: Diagnosis present

## 2019-05-03 DIAGNOSIS — K7030 Alcoholic cirrhosis of liver without ascites: Secondary | ICD-10-CM | POA: Diagnosis not present

## 2019-05-03 DIAGNOSIS — F1010 Alcohol abuse, uncomplicated: Secondary | ICD-10-CM | POA: Diagnosis present

## 2019-05-03 DIAGNOSIS — R1011 Right upper quadrant pain: Secondary | ICD-10-CM | POA: Diagnosis not present

## 2019-05-03 DIAGNOSIS — K810 Acute cholecystitis: Secondary | ICD-10-CM | POA: Diagnosis present

## 2019-05-03 DIAGNOSIS — E785 Hyperlipidemia, unspecified: Secondary | ICD-10-CM | POA: Diagnosis present

## 2019-05-03 DIAGNOSIS — Z955 Presence of coronary angioplasty implant and graft: Secondary | ICD-10-CM

## 2019-05-03 DIAGNOSIS — N189 Chronic kidney disease, unspecified: Secondary | ICD-10-CM

## 2019-05-03 DIAGNOSIS — I2510 Atherosclerotic heart disease of native coronary artery without angina pectoris: Secondary | ICD-10-CM | POA: Diagnosis present

## 2019-05-03 DIAGNOSIS — I130 Hypertensive heart and chronic kidney disease with heart failure and stage 1 through stage 4 chronic kidney disease, or unspecified chronic kidney disease: Secondary | ICD-10-CM | POA: Diagnosis present

## 2019-05-03 DIAGNOSIS — Z20828 Contact with and (suspected) exposure to other viral communicable diseases: Secondary | ICD-10-CM | POA: Diagnosis present

## 2019-05-03 DIAGNOSIS — E669 Obesity, unspecified: Secondary | ICD-10-CM | POA: Diagnosis present

## 2019-05-03 DIAGNOSIS — Z6833 Body mass index (BMI) 33.0-33.9, adult: Secondary | ICD-10-CM | POA: Diagnosis not present

## 2019-05-03 DIAGNOSIS — R079 Chest pain, unspecified: Secondary | ICD-10-CM

## 2019-05-03 DIAGNOSIS — K567 Ileus, unspecified: Secondary | ICD-10-CM | POA: Diagnosis not present

## 2019-05-03 DIAGNOSIS — F329 Major depressive disorder, single episode, unspecified: Secondary | ICD-10-CM | POA: Diagnosis present

## 2019-05-03 DIAGNOSIS — R110 Nausea: Secondary | ICD-10-CM

## 2019-05-03 DIAGNOSIS — R112 Nausea with vomiting, unspecified: Secondary | ICD-10-CM

## 2019-05-03 DIAGNOSIS — E039 Hypothyroidism, unspecified: Secondary | ICD-10-CM | POA: Diagnosis present

## 2019-05-03 DIAGNOSIS — I5042 Chronic combined systolic (congestive) and diastolic (congestive) heart failure: Secondary | ICD-10-CM | POA: Diagnosis not present

## 2019-05-03 DIAGNOSIS — K8000 Calculus of gallbladder with acute cholecystitis without obstruction: Principal | ICD-10-CM | POA: Diagnosis present

## 2019-05-03 DIAGNOSIS — Z23 Encounter for immunization: Secondary | ICD-10-CM

## 2019-05-03 DIAGNOSIS — I5022 Chronic systolic (congestive) heart failure: Secondary | ICD-10-CM | POA: Diagnosis not present

## 2019-05-03 DIAGNOSIS — Z01810 Encounter for preprocedural cardiovascular examination: Secondary | ICD-10-CM | POA: Diagnosis not present

## 2019-05-03 DIAGNOSIS — F17210 Nicotine dependence, cigarettes, uncomplicated: Secondary | ICD-10-CM | POA: Diagnosis present

## 2019-05-03 DIAGNOSIS — G40909 Epilepsy, unspecified, not intractable, without status epilepticus: Secondary | ICD-10-CM | POA: Diagnosis present

## 2019-05-03 DIAGNOSIS — N179 Acute kidney failure, unspecified: Secondary | ICD-10-CM | POA: Diagnosis present

## 2019-05-03 DIAGNOSIS — N1831 Chronic kidney disease, stage 3a: Secondary | ICD-10-CM | POA: Diagnosis present

## 2019-05-03 DIAGNOSIS — R109 Unspecified abdominal pain: Secondary | ICD-10-CM

## 2019-05-03 DIAGNOSIS — I252 Old myocardial infarction: Secondary | ICD-10-CM | POA: Diagnosis not present

## 2019-05-03 DIAGNOSIS — R188 Other ascites: Secondary | ICD-10-CM | POA: Diagnosis present

## 2019-05-03 DIAGNOSIS — K829 Disease of gallbladder, unspecified: Secondary | ICD-10-CM

## 2019-05-03 LAB — I-STAT CHEM 8, ED
BUN: 20 mg/dL (ref 8–23)
Calcium, Ion: 1.18 mmol/L (ref 1.15–1.40)
Chloride: 104 mmol/L (ref 98–111)
Creatinine, Ser: 1.8 mg/dL — ABNORMAL HIGH (ref 0.61–1.24)
Glucose, Bld: 125 mg/dL — ABNORMAL HIGH (ref 70–99)
HCT: 50 % (ref 39.0–52.0)
Hemoglobin: 17 g/dL (ref 13.0–17.0)
Potassium: 3.9 mmol/L (ref 3.5–5.1)
Sodium: 139 mmol/L (ref 135–145)
TCO2: 21 mmol/L — ABNORMAL LOW (ref 22–32)

## 2019-05-03 LAB — CBC
HCT: 47.1 % (ref 39.0–52.0)
Hemoglobin: 16.8 g/dL (ref 13.0–17.0)
MCH: 35.4 pg — ABNORMAL HIGH (ref 26.0–34.0)
MCHC: 35.7 g/dL (ref 30.0–36.0)
MCV: 99.2 fL (ref 80.0–100.0)
Platelets: 209 10*3/uL (ref 150–400)
RBC: 4.75 MIL/uL (ref 4.22–5.81)
RDW: 12.3 % (ref 11.5–15.5)
WBC: 11.7 10*3/uL — ABNORMAL HIGH (ref 4.0–10.5)
nRBC: 0 % (ref 0.0–0.2)

## 2019-05-03 LAB — COMPREHENSIVE METABOLIC PANEL
ALT: 127 U/L — ABNORMAL HIGH (ref 0–44)
AST: 109 U/L — ABNORMAL HIGH (ref 15–41)
Albumin: 4.6 g/dL (ref 3.5–5.0)
Alkaline Phosphatase: 106 U/L (ref 38–126)
Anion gap: 14 (ref 5–15)
BUN: 19 mg/dL (ref 8–23)
CO2: 20 mmol/L — ABNORMAL LOW (ref 22–32)
Calcium: 10 mg/dL (ref 8.9–10.3)
Chloride: 103 mmol/L (ref 98–111)
Creatinine, Ser: 1.91 mg/dL — ABNORMAL HIGH (ref 0.61–1.24)
GFR calc Af Amer: 42 mL/min — ABNORMAL LOW (ref >60)
GFR calc non Af Amer: 36 mL/min — ABNORMAL LOW (ref >60)
Glucose, Bld: 131 mg/dL — ABNORMAL HIGH (ref 70–99)
Potassium: 3.8 mmol/L (ref 3.5–5.1)
Sodium: 137 mmol/L (ref 135–145)
Total Bilirubin: 2.1 mg/dL — ABNORMAL HIGH (ref 0.3–1.2)
Total Protein: 7.3 g/dL (ref 6.5–8.1)

## 2019-05-03 LAB — LIPASE, BLOOD: Lipase: 38 U/L (ref 11–51)

## 2019-05-03 LAB — CBG MONITORING, ED: Glucose-Capillary: 114 mg/dL — ABNORMAL HIGH (ref 70–99)

## 2019-05-03 LAB — TROPONIN I (HIGH SENSITIVITY)
Troponin I (High Sensitivity): 10 ng/L (ref <18)
Troponin I (High Sensitivity): 9 ng/L (ref <18)

## 2019-05-03 LAB — LACTIC ACID, PLASMA: Lactic Acid, Venous: 1.3 mmol/L (ref 0.5–1.9)

## 2019-05-03 MED ORDER — PIPERACILLIN-TAZOBACTAM 3.375 G IVPB 30 MIN
3.3750 g | Freq: Once | INTRAVENOUS | Status: AC
  Administered 2019-05-03: 3.375 g via INTRAVENOUS
  Filled 2019-05-03: qty 50

## 2019-05-03 MED ORDER — ONDANSETRON HCL 4 MG/2ML IJ SOLN
4.0000 mg | Freq: Once | INTRAMUSCULAR | Status: AC
  Administered 2019-05-03: 4 mg via INTRAVENOUS
  Filled 2019-05-03: qty 2

## 2019-05-03 MED ORDER — HYDROMORPHONE HCL 1 MG/ML IJ SOLN
1.0000 mg | Freq: Once | INTRAMUSCULAR | Status: AC
  Administered 2019-05-03: 1 mg via INTRAVENOUS
  Filled 2019-05-03: qty 1

## 2019-05-03 MED ORDER — HYDROMORPHONE HCL 1 MG/ML IJ SOLN
0.5000 mg | INTRAMUSCULAR | Status: DC | PRN
  Administered 2019-05-04 (×4): 0.5 mg via INTRAVENOUS
  Filled 2019-05-03 (×7): qty 1

## 2019-05-03 MED ORDER — ASPIRIN 81 MG PO CHEW: 324.0000 mg | CHEWABLE_TABLET | Freq: Once | ORAL | Status: DC

## 2019-05-03 MED ORDER — IOHEXOL 350 MG/ML SOLN
80.0000 mL | Freq: Once | INTRAVENOUS | Status: AC | PRN
  Administered 2019-05-03: 80 mL via INTRAVENOUS

## 2019-05-03 MED ORDER — MORPHINE SULFATE (PF) 4 MG/ML IV SOLN
6.0000 mg | Freq: Once | INTRAVENOUS | Status: AC
  Administered 2019-05-03: 6 mg via INTRAVENOUS
  Filled 2019-05-03: qty 2

## 2019-05-03 NOTE — ED Notes (Signed)
Patient transported to [facility] 

## 2019-05-03 NOTE — H&P (Signed)
History and Physical  Gevena CottonCarlos N Spidle ZOX:[id number] DOB: 04-16-1956 DOA: 05/03/2019  Referring physician: ER provider PCP: Pearson ForsterKelly, William S, MD  Outpatient Specialists: Cardiologist, Dr.  [place]ilandJordan Patient coming from: Home  Chief Complaint: Abdominal pain, nausea and vomiting  HPI: Patient is a 63 year old Caucasian male, obese, with past medical history significant for ischemic cardiomyopathy with an EF of 25 to 30% as per echocardiogram done about 4 years ago, ST elevation MI status post PCI, spinal stenosis with resultant significant back pain, chronic kidney disease stage II/III, seizures, osteoarthritis, hypothyroidism and abnormal LFTs.  Patient presents with severe epigastric and right upper quadrant pain that radiated to the back, and this occurred after dinner today.  Associated significant nausea and vomiting.  No fever or chills, no headache, no neck pain, no URI symptoms, no diarrhea or urinary symptoms.  CT angio of the chest and abdomen done to rule out dissection was negative for dissection, however, revealed abnormal gallbladder with stones/sludge, evidence of pericholecystic inflammation, and superimposed pneumobilia.  Gas within the lumen of the gallbladder, but not identified within the gallbladder wall was also reported.  Findings were said to be suspicious for acute cholecystitis.  ER provider has consulted the surgical team.  Patient reported being reasonably active prior to onset of symptoms, but degree of activity is limited by back pain.  Patient also has intermittent chest pain for which he uses sublingual nitro, approximately monthly.  ED Course: Presentation to the hospital, temperature was 98.7, blood pressure 128/80, heart rate of 74 with respiratory rate of 19 and O2 sat of 98% on room air.  ER provider has started patient on IV Zosyn.  Surgical team has been consulted.  Ultrasound is pending as per the ER provider.  Cardiology will be consulted to clear patient prior to  surgery as per ER provider.  Pertinent labs: Chemistry reveals sodium of 139, potassium of 3.8, chloride 103, CO2 20, BUN of 19 and creatinine of 1.91 history is 109, ALT of 127, total bilirubin of 2.1.  High sensitive troponin ice 10.  WBC of 11.7, hemoglobin of 16.8, hematocrit of 47.1, MCV of 99.2 platelet count of 209.  EKG: Independently reviewed.   Imaging: independently reviewed.  CT angio of the chest and abdomen revealed:  "1. Negative for aortic dissection or aneurysm, but positive for ectatic abdominal aorta at risk for aneurysm development. Recommend followup by Ultrasound in 5 years. This recommendation follows ACR consensus guidelines: White Paper of the ACR Incidental Findings Committee II on Vascular Findings. J Am Coll Radiol 2013; 10:789-794. Aortic aneurysm NOS (ICD10-I71.9).  2. Abnormal gallbladder with stones/sludge, evidence of pericholecystic inflammation, and superimposed pneumobilia. Gas within the lumen of the gallbladder, but not identified within the gallbladder wall. The appearance is suspicious for Acute Cholecystitis, and if the patient is toxic then consider an atypical appearance of Emphysematous Cholecystitis. Recommend Surgery consultation.  3. No other acute or inflammatory process identified in the chest, abdomen, or pelvis.  4. Possible hepatic Cirrhosis, but no stigmata of portal venous hypertension. Extensive calcified coronary artery atherosclerosis and/or stent. Diverticulosis of the large bowel.  5. Two small 3-4 mm subpleural right lung nodules are likely postinflammatory. No follow-up needed if patient is low-risk (and has no known or suspected primary neoplasm). Non-contrast chest CT can be considered in 12 months if patient is high-risk".   Review of Systems:  Negative for fever, visual changes, sore throat, rash, new muscle aches, chest pain, SOB, dysuria, bleeding.  Past Medical History:  Diagnosis  Date   Abnormal LFTs     Anxiety    CAD (coronary artery disease)    residual disease to LCX and RCA   Chronic systolic CHF (congestive heart failure) (HCC) 04/05/2015   Hypothyroidism    Ischemic cardiomyopathy    EF 25-30%   Osteoarthritis    Seizures (HCC)    last was > 20 years ago   Spinal stenosis    STEMI (ST elevation myocardial infarction) (HCC) 04/28/14   with stent to LAD    Past Surgical History:  Procedure Laterality Date   APPENDECTOMY     BACK SURGERY     CORONARY ANGIOPLASTY WITH STENT PLACEMENT  04/28/14   Promus DES to LAD   LEFT HEART CATH N/A 04/28/2014   Procedure: LEFT HEART CATH;  Surgeon: Peter M [place], MD;  Location: [facility] CATH LAB;  Service: Cardiovascular;  Laterality: N/A;   NECK SURGERY     TONSILLECTOMY       reports that he quit smoking about 5 years ago. His smoking use included cigarettes. He smoked 0.00 packs per day for 42.00 years. He quit smokeless tobacco use about 37 years ago. He reports current alcohol use of about 3.0 - 5.0 standard drinks of alcohol per week. He reports that he does not use drugs.  No Known Allergies  Family History  Problem Relation Age of Onset   Hypertension Mother    Peripheral vascular disease Mother    Stroke Father    Heart disease Brother      Prior to Admission medications   Medication Sig Start Date End Date Taking? Authorizing Provider  Acetaminophen (TYLENOL PO) Take 2 tablets by mouth daily as needed (pain). Pt unsure of strength (08/10/14)   Yes [provider]  aspirin 81 MG tablet Take 81 mg by mouth daily.   Yes [provider]  busPIRone (BUSPAR) 10 MG tablet Take 10 mg by mouth 3 (three) times daily as needed. Anxiety 04/04/14  Yes [provider]  furosemide (LASIX) 20 MG tablet TAKE 1 TABLET BY MOUTH EVERY DAY Patient taking differently: Take 20 mg by mouth daily.  11/26/18  Yes [place], Peter M, MD  levothyroxine (SYNTHROID) 125 MCG tablet Take 125 mcg by mouth daily.  08/30/18  Yes [provider]  metoprolol tartrate (LOPRESSOR) 25 MG tablet TAKE ONE-HALF TABLET BY MOUTH TWICE DAILY Patient taking differently: Take 12.5 mg by mouth 2 (two) times daily.  03/08/18  Yes [place], Peter M, MD  nitroGLYCERIN (NITROSTAT) 0.4 MG SL tablet Place 1 tablet (0.4 mg total) under the tongue every 5 (five) minutes x 3 doses as needed for chest pain. 11/22/18  Yes [place], Peter M, MD  ramipril (ALTACE) 5 MG capsule TAKE 1 CAPSULE BY MOUTH EVERY DAY Patient taking differently: Take 5 mg by mouth daily.  04/11/19  Yes [place], Peter M, MD  Vitamin D, Cholecalciferol, 25 MCG (1000 UT) TABS Take 1 tablet by mouth daily.   Yes [provider]    Physical Exam: Vitals:   05/03/19 2155 05/03/19 2200 05/03/19 2230 05/03/19 2252  BP:  135/86  128/82  Pulse:   100 74  Resp:   16 19  Temp: 98.7 F (37.1 C)     TempSrc: Oral     SpO2:   98% 98%    Constitutional:   Appears calm and comfortable.  Patient is obese. Eyes:   No pallor. No jaundice.  ENMT:   external ears, nose appear normal Neck:  Neck is supple. No JVD Respiratory:   CTA bilaterally, no w/r/r.   Respiratory effort normal. No retractions or accessory muscle use Cardiovascular:   S1S2  No LE extremity edema   Abdomen:  Abdomen is obese, soft with right upper quadrant tenderness (Murphy's sign).   Neurologic:   Awake and alert.  Moves all limbs.  Wt Readings from Last 3 Encounters:  02/22/18 113 kg  07/14/17 110.2 kg  12/29/16 107.3 kg    I have personally reviewed following labs and imaging studies  Labs on Admission:  CBC: Recent Labs  Lab 05/03/19 2040 05/03/19 2051  WBC 11.7*  --   HGB 16.8 17.0  HCT 47.1 50.0  MCV 99.2  --   PLT 209  --    Basic Metabolic Panel: Recent Labs  Lab 05/03/19 2040 05/03/19 2051  NA 137 139  K 3.8 3.9  CL 103 104  CO2 20*  --   GLUCOSE 131* 125*  BUN 19 20  CREATININE 1.91* 1.80*  CALCIUM 10.0  --    Liver Function  Tests: Recent Labs  Lab 05/03/19 2040  AST 109*  ALT 127*  ALKPHOS 106  BILITOT 2.1*  PROT 7.3  ALBUMIN 4.6   Recent Labs  Lab 05/03/19 2040  LIPASE 38   No results for input(s): AMMONIA in the last 168 hours. Coagulation Profile: No results for input(s): INR, PROTIME in the last 168 hours. Cardiac Enzymes: No results for input(s): CKTOTAL, CKMB, CKMBINDEX, TROPONINI in the last 168 hours. BNP (last 3 results) No results for input(s): PROBNP in the last 8760 hours. HbA1C: No results for input(s): HGBA1C in the last 72 hours. CBG: Recent Labs  Lab 05/03/19 2100  GLUCAP 114*   Lipid Profile: No results for input(s): CHOL, HDL, LDLCALC, TRIG, CHOLHDL, LDLDIRECT in the last 72 hours. Thyroid Function Tests: No results for input(s): TSH, T4TOTAL, FREET4, T3FREE, THYROIDAB in the last 72 hours. Anemia Panel: No results for input(s): VITAMINB12, FOLATE, FERRITIN, TIBC, IRON, RETICCTPCT in the last 72 hours. Urine analysis:    Component Value Date/Time   COLORURINE AMBER (A) 05/01/2014 1216   APPEARANCEUR CLEAR 05/01/2014 1216   LABSPEC 1.029 05/01/2014 1216   PHURINE 6.0 05/01/2014 1216   GLUCOSEU NEGATIVE 05/01/2014 1216   HGBUR NEGATIVE 05/01/2014 1216   BILIRUBINUR SMALL (A) 05/01/2014 1216   KETONESUR 15 (A) 05/01/2014 1216   PROTEINUR 30 (A) 05/01/2014 1216   UROBILINOGEN 1.0 05/01/2014 1216   NITRITE NEGATIVE 05/01/2014 1216   LEUKOCYTESUR TRACE (A) 05/01/2014 1216   Sepsis Labs: @LABRCNTIP (procalcitonin:4,lacticidven:4) )No results found for this or any previous visit (from the past 240 hour(s)).    Radiological Exams on Admission: Dg Chest Portable 1 View  Result Date: 05/03/2019 CLINICAL DATA:  Chest pain EXAM: PORTABLE CHEST 1 VIEW COMPARISON:  05/01/2014 FINDINGS: No focal consolidation or effusion. Borderline cardiomegaly. No pneumothorax. IMPRESSION: No active disease.  Borderline cardiomegaly Electronically Signed   By: 05/03/2014 M.D.   On:  05/03/2019 20:42   Ct Angio Chest/abd/pel For Dissection W And/or Wo Contrast  Addendum Date: 05/03/2019   ADDENDUM REPORT: 05/03/2019 21:57 ADDENDUM: Study discussed by telephone with Dr. 05/05/2019 in the ED on 05/03/2019 at 2151 hours. Electronically Signed   By: [id number] M.D.   On: 05/03/2019 21:57   Result Date: 05/03/2019 CLINICAL DATA:  63 year old male with chest pain radiating to the back and abdomen since 1800 hours. EXAM: CT ANGIOGRAPHY CHEST, ABDOMEN AND PELVIS TECHNIQUE: Multidetector CT imaging through the chest,  abdomen and pelvis was performed using the standard protocol during bolus administration of intravenous contrast. Multiplanar reconstructed images and MIPs were obtained and reviewed to evaluate the vascular anatomy. CONTRAST:  80mL OMNIPAQUE IOHEXOL 350 MG/ML SOLN COMPARISON:  Portable chest earlier today. FINDINGS: CTA CHEST FINDINGS Cardiovascular: Extensive calcified coronary artery atherosclerosis and/or stent on series 5, image 32. But there is minimal calcified plaque of the thoracic aorta. Negative for thoracic aortic aneurysm or dissection. Cardiac size at the upper limits of normal. No pericardial effusion. Mediastinum/Nodes: Negative. Lungs/Pleura: Major airways are patent. Trace apical scarring and paraseptal emphysema. There are 2 small subpleural and possibly sub solid right lung nodules on series 7, images 56 and 61, 3-4 millimeters. Otherwise the lungs are clear. No pleural effusion. Musculoskeletal: No acute osseous abnormality identified. Review of the MIP images confirms the above findings. CTA ABDOMEN AND PELVIS FINDINGS VASCULAR Aortoiliac calcified atherosclerosis. Ectatic infrarenal abdominal aorta up to 26 millimeters diameter, but no aortic aneurysm or dissection. Mild ectasia also of the bilateral iliac arteries which are patent. Visible proximal femoral arteries remain patent. The major branches of the abdominal aorta are patent, with only mild atherosclerosis Review  of the MIP images confirms the above findings. NON-VASCULAR Hepatobiliary: There is pneumobilia in the liver and in the CBD. And there is also a moderate volume of gas within the lumen of the gallbladder, non dependent on series 6, image 158. No gas identified within the wall of the gallbladder. Irregular stones, sludge, and debris also within the gallbladder lumen. And on coronal image 55 and series 6, image 160 there is evidence of some pericholecystic inflammation. The wall at the fundus appears normal where it is distended by gas. The liver contour appears nodular. No discrete liver lesion. Questionable hyperenhancement along the gallbladder fossa. Pancreas: Negative. Spleen: Diminutive, negative. Adrenals/Urinary Tract: Normal adrenal glands. Bilateral renal enhancement is symmetric and within normal limits. No hydronephrosis. Decompressed ureters. Unremarkable urinary bladder. No nephrolithiasis identified. Stomach/Bowel: Decompressed and negative rectum. Redundant sigmoid colon with extensive diverticulosis in the proximal segment but no active inflammation identified. Lesser diverticulosis throughout the decompressed descending colon with no active inflammation. Occasional diverticula also in the transverse colon. The right colon is on a lax mesentery. No large bowel inflammation identified. The cecum is in the right upper quadrant. The appendix is diminutive or absent. Negative terminal ileum. No dilated small bowel. Negative stomach. Small duodenal diverticulum on coronal image 83. No inflammation. No pneumoperitoneum. No free fluid. Lymphatic: No lymphadenopathy. Reproductive: Negative. Other: No pelvic free fluid. Musculoskeletal: No acute osseous abnormality identified. Review of the MIP images confirms the above findings. IMPRESSION: 1. Negative for aortic dissection or aneurysm, but positive for ectatic abdominal aorta at risk for aneurysm development. Recommend followup by Ultrasound in 5 years. This  recommendation follows ACR consensus guidelines: White Paper of the ACR Incidental Findings Committee II on Vascular Findings. J Am Coll Radiol 2013; 10:789-794. Aortic aneurysm NOS (ICD10-I71.9). 2. Abnormal gallbladder with stones/sludge, evidence of pericholecystic inflammation, and superimposed pneumobilia. Gas within the lumen of the gallbladder, but not identified within the gallbladder wall. The appearance is suspicious for Acute Cholecystitis, and if the patient is toxic then consider an atypical appearance of Emphysematous Cholecystitis. Recommend Surgery consultation. 3. No other acute or inflammatory process identified in the chest, abdomen, or pelvis. 4. Possible hepatic Cirrhosis, but no stigmata of portal venous hypertension. Extensive calcified coronary artery atherosclerosis and/or stent. Diverticulosis of the large bowel. 5. Two small 3-4 mm subpleural right lung nodules  are likely postinflammatory. No follow-up needed if patient is low-risk (and has no known or suspected primary neoplasm). Non-contrast chest CT can be considered in 12 months if patient is high-risk. This recommendation follows the consensus statement: Guidelines for Management of Incidental Pulmonary Nodules Detected on CT Images: From the Fleischner Society 2017; Radiology 2017; 284:228-243. Electronically Signed: By: Genevie Ann M.D. On: 05/03/2019 21:48   US Abdomen Limited Ruq  Result Date: 05/03/2019 CLINICAL DATA:  63 year old male with abnormal gallbladder on CTA Chest, Abdomen, and Pelvis today. EXAM: ULTRASOUND ABDOMEN LIMITED RIGHT UPPER QUADRANT COMPARISON:  CTA chest, Abdomen, and Pelvis today are reported separately. FINDINGS: Gallbladder: 21 millimeter shadowing echogenic gallstone redemonstrated. Additional shadowing likely due to the gallbladder air seen by CT earlier today. Gallbladder wall thickness 6 millimeters. No pericholecystic fluid is evident. No sonographic Percell Miller sign was elicited. Common bile duct:  Diameter: 3 millimeters, normal. Liver: Nodular liver contour (image 18). Coarse hepatic echotexture. No discrete liver lesion. Background echogenicity within normal limits (image 26). Portal vein is patent on color Doppler imaging with normal direction of blood flow towards the liver. Other: Negative visible right kidney. IMPRESSION: 1. Abnormal gallbladder with stones, wall thickening, and evidence of the gas seen by CT. But no sonographic Murphy sign was elicited. 2. No bile duct dilatation. 3. Nodular liver contour again suggestive of Cirrhosis. Electronically Signed   By: Genevie Ann M.D.   On: 05/03/2019 23:12    EKG: Independently reviewed.   Active Problems:   * No active hospital problems. *   Assessment/Plan Likely acute cholecystitis, query emphysematous: -Admit patient for further assessment and management. -Surgical team has been consulted. -Ultrasound of the right upper quadrant. -Cardiology to clear patient prior to surgery -IV antibiotics -Further management as per surgical team.  Acute kidney injury on chronic kidney disease stage II/III: Possibly prerenal. Cautious hydration.  History of systolic congestive heart failure: No symptoms for now. Last echo was 45 years ago, with EF of 25 to 30% We will defer the decision to repeat echocardiogram to the cardiology team  Coronary artery disease/history of NSTEMI status post PCI: No chest pain. First high-sensitivity troponin came back normal Cycle cardiac enzymes.  Obesity: Further management on outpatient basis Consider sleep study on outpatient basis  Abnormal LFTs: This is chronic. CT abdomen is suggestive of possible liver cirrhosis Patient is at risk for NASH Acute hepatitis profile  DVT prophylaxis: Subcu Lovenox Code Status: Full code Family Communication: Wife Disposition Plan: Will depend on hospital course Consults called: ER has consulted surgery.  ER also intends to consult cardiology for  clearance. Admission status: Inpatient  Time spent: 65 minutes.   Dana Allan, MD  [facility] Pager #: [phone] 7PM-7AM contact night coverage as above  05/03/2019, 11:28 PM

## 2019-05-03 NOTE — ED Notes (Signed)
Patient transported to CT 

## 2019-05-03 NOTE — Progress Notes (Signed)
Patient ID: Randy Beasley, male   DOB: [birth year], 63 y.o.   MRN: [id number]  Brief Note:  I am involved in a level 1 trauma.  Surgery was consulted for this patient who was found to have probable cholecystitis seen on a CT scan performed to rule out aortic dissection.  He has CAD, chronic CHF, decreased EF.  I have ordered an [place] for a more definite look at the [place].  We have asked [facility] to admit since this patient is an ASA III patient and will need cardiac clearance prior to any consideration of surgery.  Hold ASA. Remain NPO.  We will leave a formal consultation later.  Imogene Burn. Georgette Dover, MD, [facility] Surgery  General/ Trauma Surgery   05/03/2019 10:41 PM

## 2019-05-03 NOTE — ED Triage Notes (Signed)
Per [facility] Pt ate dinner at 1800 and 30 min after had 10/10 CP that radiates to back and abd. Pt took 4 ASA 81mg  at home and took 2 nitro. Pt has had 7 emesis episodes since 1930. EMS gave 1 nitro and 4mg  morphine with no relief.

## 2019-05-03 NOTE — ED Provider Notes (Addendum)
[place] EMERGENCY DEPARTMENT Provider Note   CSN: [id number] Arrival date & time: 05/03/19  2015     History   Chief Complaint Chief Complaint  Patient presents with   Chest Pain    HPI Randy Beasley is a 63 y.o. male.     Patient with history of STEMI, drug-eluting stent placed to the LAD in 2015 (residual non-obstructive disease noted 70% circumflex and a 50% RCA), history of heart failure with reduced ejection fraction/ischemic cardiomyopathy, history of appendectomy --presents to the emergency department with sudden severe pain in the middle of his upper abdomen radiating to his chest and back starting about 2 hours ago.  Patient had just finished dinner.  He took aspirin and nitro at home without improvement.  EMS was called.  Patient has had multiple episodes of vomiting.  EMS gave aspirin, nitroglycerin, morphine without any improvement.  Patient feels short of breath to the pain.  He is diaphoretic.  He denies any numbness or tingling in his arms or legs.  No strokelike symptoms or neck pain.  States that symptoms remind him somewhat of his previous MI.  Onset of symptoms acute.  Course is constant.     Past Medical History:  Diagnosis Date   Abnormal LFTs    Anxiety    CAD (coronary artery disease)    residual disease to LCX and RCA   Chronic systolic CHF (congestive heart failure) ([place]) 04/05/2015   Hypothyroidism    Ischemic cardiomyopathy    EF 25-30%   Osteoarthritis    Seizures ([place])    last was > 20 years ago   Spinal stenosis    STEMI (ST elevation myocardial infarction) ([place]) 04/28/14   with stent to LAD    Patient Active Problem List   Diagnosis Date Noted   Ischemic cardiomyopathy    Chronic systolic CHF (congestive heart failure) ([place]) 04/05/2015   At risk for sudden cardiac death, now with lifevest May 30, 2014   CAD (coronary artery disease) 05/01/2014   Transaminitis 05/01/2014   Hypothyroidism 05/01/2014    Fever 05/01/2014   Cardiomyopathy, ischemic 04/29/2014   Dyslipidemia 04/29/2014   ST elevation myocardial infarction (STEMI) involving left anterior descending (LAD) coronary artery in recovery phase ([place]) 04/28/2014   Tobacco abuse 02/15/2012   Alcohol use 02/15/2012    Past Surgical History:  Procedure Laterality Date   APPENDECTOMY     BACK SURGERY     CORONARY ANGIOPLASTY WITH STENT PLACEMENT  04/28/14   Promus DES to LAD   LEFT HEART CATH N/A 04/28/2014   Procedure: LEFT HEART CATH;  Surgeon: Peter M Martinique, MD;  Location: [facility] CATH LAB;  Service: Cardiovascular;  Laterality: N/A;   NECK SURGERY     TONSILLECTOMY          Home Medications    Prior to Admission medications   Medication Sig Start Date End Date Taking? Authorizing Provider  Acetaminophen (TYLENOL PO) Take 2 tablets by mouth daily as needed (pain). Pt unsure of strength (08/10/14)    [provider]  aspirin 81 MG tablet Take 81 mg by mouth daily.    [provider]  busPIRone (BUSPAR) 10 MG tablet Take 10 mg by mouth 3 (three) times daily as needed. Anxiety 04/04/14   [provider]  furosemide (LASIX) 20 MG tablet TAKE 1 TABLET BY MOUTH EVERY DAY 11/26/18   Martinique, Peter M, MD  levothyroxine (SYNTHROID) 125 MCG tablet Take 125 mcg by mouth daily. 08/30/18  [provider]  metoprolol tartrate (LOPRESSOR) 25 MG tablet TAKE ONE-HALF TABLET BY MOUTH TWICE DAILY 03/08/18   [place], Peter M, MD  nitroGLYCERIN (NITROSTAT) 0.4 MG SL tablet Place 1 tablet (0.4 mg total) under the tongue every 5 (five) minutes x 3 doses as needed for chest pain. 11/22/18   [place], Peter M, MD  ramipril (ALTACE) 5 MG capsule TAKE 1 CAPSULE BY MOUTH EVERY DAY 04/11/19   [place], Peter M, MD    Family History Family History  Problem Relation Age of Onset   Hypertension Mother    Peripheral vascular disease Mother    Stroke Father    Heart disease Brother     Social History Social  History   Tobacco Use   Smoking status: Former Smoker    Packs/day: 0.00    Years: 42.00    Pack years: 0.00    Types: Cigarettes    Quit date: 07/29/2013    Years since quitting: 5.7   Smokeless tobacco: Former Neurosurgeon    Quit date: 02/14/1982  Substance Use Topics   Alcohol use: Yes    Alcohol/week: 3.0 - 5.0 standard drinks    Types: 3 - 5 Cans of beer per week    Comment: previously a case of beer a week, 3-4 beers a day, 5 years.  Has not drank in several weeks.   Drug use: No     Allergies   Patient has no known allergies.   Review of Systems Review of Systems  Constitutional: Positive for diaphoresis. Negative for fever.  Eyes: Negative for redness.  Respiratory: Positive for shortness of breath. Negative for cough.   Cardiovascular: Positive for chest pain. Negative for palpitations and leg swelling.  Gastrointestinal: Positive for abdominal pain, nausea and vomiting.  Genitourinary: Negative for dysuria.  Musculoskeletal: Positive for back pain. Negative for neck pain.  Skin: Negative for rash.  Neurological: Negative for syncope and light-headedness.  Psychiatric/Behavioral: The patient is not nervous/anxious.      Physical Exam Updated Vital Signs BP (!) 132/98    Pulse 72    Resp (!) 24    SpO2 100%   Physical Exam Vitals signs and nursing note reviewed.  Constitutional:      General: He is in acute distress.     Appearance: He is well-developed. He is ill-appearing and diaphoretic.     Comments: Patient appears to be in significant pain.  HENT:     Head: Normocephalic and atraumatic.     Mouth/Throat:     Mouth: Mucous membranes are not dry.  Eyes:     Conjunctiva/sclera: Conjunctivae normal.  Neck:     Musculoskeletal: Normal range of motion and neck supple. No muscular tenderness.     Vascular: Normal carotid pulses. No carotid bruit or JVD.     Trachea: Trachea normal. No tracheal deviation.  Cardiovascular:     Rate and Rhythm: Normal rate  and regular rhythm.     Pulses: No decreased pulses.     Heart sounds: Normal heart sounds, S1 normal and S2 normal. Heart sounds not distant. No murmur.  Pulmonary:     Effort: Pulmonary effort is normal. No respiratory distress.     Breath sounds: Normal breath sounds. No wheezing.  Chest:     Chest wall: No tenderness.  Abdominal:     General: Bowel sounds are normal.     Palpations: Abdomen is soft.     Tenderness: There is abdominal tenderness. There is guarding. There is no  rebound.     Comments: Upper abdominal pain, epigastric, moderate.  Musculoskeletal:     Right lower leg: No edema.     Left lower leg: No edema.  Skin:    General: Skin is warm.     Coloration: Skin is not pale.  Neurological:     Mental Status: He is alert.      ED Treatments / Results  Labs (all labs ordered are listed, but only abnormal results are displayed) Labs Reviewed  CBC - Abnormal; Notable for the following components:      Result Value   WBC 11.7 (*)    MCH 35.4 (*)    All other components within normal limits  COMPREHENSIVE METABOLIC PANEL - Abnormal; Notable for the following components:   CO2 20 (*)    Glucose, Bld 131 (*)    Creatinine, Ser 1.91 (*)    AST 109 (*)    ALT 127 (*)    Total Bilirubin 2.1 (*)    GFR calc non Af Amer 36 (*)    GFR calc Af Amer 42 (*)    All other components within normal limits  CBG MONITORING, ED - Abnormal; Notable for the following components:   Glucose-Capillary 114 (*)    All other components within normal limits  I-STAT CHEM 8, ED - Abnormal; Notable for the following components:   Creatinine, Ser 1.80 (*)    Glucose, Bld 125 (*)    TCO2 21 (*)    All other components within normal limits  CULTURE, BLOOD (ROUTINE X 2)  CULTURE, BLOOD (ROUTINE X 2)  SARS CORONAVIRUS 2 (TAT 6-24 HRS)  LIPASE, BLOOD  LACTIC ACID, PLASMA  LACTIC ACID, PLASMA  TROPONIN I (HIGH SENSITIVITY)  TROPONIN I (HIGH SENSITIVITY)    EKG EKG  Interpretation  Date/Time:  Tuesday May 03 2019 20:17:30 EST Ventricular Rate:  69 PR Interval:    QRS Duration: 95 QT Interval:  428 QTC Calculation: 459 R Axis:   -8 Text Interpretation: Sinus rhythm Low voltage, precordial leads Probable anteroseptal infarct, old No significant change since last tracing Confirmed by Richardean Canal [phone]) on 05/03/2019 8:21:53 PM   Radiology Dg Chest Portable 1 View  Result Date: 05/03/2019 CLINICAL DATA:  Chest pain EXAM: PORTABLE CHEST 1 VIEW COMPARISON:  05/01/2014 FINDINGS: No focal consolidation or effusion. Borderline cardiomegaly. No pneumothorax. IMPRESSION: No active disease.  Borderline cardiomegaly Electronically Signed   By: Jasmine Pang M.D.   On: 05/03/2019 20:42    Procedures Procedures (including critical care time)  Medications Ordered in ED Medications  piperacillin-tazobactam (ZOSYN) IVPB 3.375 g (has no administration in time range)  morphine 4 MG/ML injection 6 mg (6 mg Intravenous Given 05/03/19 2047)  ondansetron (ZOFRAN) injection 4 mg (4 mg Intravenous Given 05/03/19 2047)  iohexol (OMNIPAQUE) 350 MG/ML injection 80 mL (80 mLs Intravenous Contrast Given 05/03/19 2111)  HYDROmorphone (DILAUDID) injection 1 mg (1 mg Intravenous Given 05/03/19 2240)     Initial Impression / Assessment and Plan / ED Course  I have reviewed the triage vital signs and the nursing notes.  Pertinent labs & imaging results that were available during my care of the patient were reviewed by me and considered in my medical decision making (see chart for details).        Patient seen and examined.  Patient is in distress.  Normal heart rate.  Blood pressure 130 systolic.  Seen shortly after my exam by Dr. Silverio Lay.  Orders are in.  He  has received aspirin prior.  No STEMI on EKG.  Differential includes ACS, dissection, perforated viscus.  Is hemodynamically stable at the current time.  Will obtain CT imaging to evaluate for dissection or other  abdominal etiology.  Vital signs reviewed and are as follows: BP (!) 132/98    Pulse 72    Resp (!) 24    SpO2 100%   8:43 PM Reviewed CXR. No free air noted.   9:27 PM Reviewed CT.   Radiology called and discussed with Dr. Silverio LayYao. Pt has acute cholecystitis.   Patient reevaluated.  He is more comfortable.  Antibiotics ordered.  10:45 PM Discussion regarding admission had with Dr. Corliss Skainssuei, Dr. Dartha Lodgegbata, Dr. Antionette Charpyd. Hospitalist to admit and surgery will consult. Dr. Corliss Skainssuei requests NPO after midnight. Hospitalist requests involvement of cardiology for clearance.    10:55 PM I spoke with oncall cardiology fellow. Plan for cards consult in AM for pre-op clearance given h/o HFrEF.   CRITICAL CARE Performed by: Renne CriglerJoshua Aarika Moon PA-C Total critical care time: 35 minutes Critical care time was exclusive of separately billable procedures and treating other patients. Critical care was necessary to treat or prevent imminent or life-threatening deterioration. Critical care was time spent personally by me on the following activities: development of treatment plan with patient and/or surrogate as well as nursing, discussions with consultants, evaluation of patient's response to treatment, examination of patient, obtaining history from patient or surrogate, ordering and performing treatments and interventions, ordering and review of laboratory studies, ordering and review of radiographic studies, pulse oximetry and re-evaluation of patient's condition.   inal Clinical Impressions(s) / ED Diagnoses   Final diagnoses:  Acute cholecystitis  Chest pain, unspecified type  Chronic systolic heart failure (HCC)   Admit.   ED Discharge Orders    None          Renne CriglerGeiple, Narda Fundora, Cordelia Poche-C 05/03/19 2308    Charlynne PanderYao, David Hsienta, MD 05/06/19 [phone]

## 2019-05-04 ENCOUNTER — Other Ambulatory Visit (HOSPITAL_COMMUNITY): Payer: Medicare HMO

## 2019-05-04 ENCOUNTER — Inpatient Hospital Stay (HOSPITAL_COMMUNITY): Payer: Medicare HMO

## 2019-05-04 ENCOUNTER — Encounter (HOSPITAL_COMMUNITY): Payer: Self-pay

## 2019-05-04 DIAGNOSIS — I5022 Chronic systolic (congestive) heart failure: Secondary | ICD-10-CM

## 2019-05-04 DIAGNOSIS — I255 Ischemic cardiomyopathy: Secondary | ICD-10-CM

## 2019-05-04 DIAGNOSIS — K7030 Alcoholic cirrhosis of liver without ascites: Secondary | ICD-10-CM

## 2019-05-04 DIAGNOSIS — K810 Acute cholecystitis: Secondary | ICD-10-CM

## 2019-05-04 DIAGNOSIS — Z01810 Encounter for preprocedural cardiovascular examination: Secondary | ICD-10-CM

## 2019-05-04 LAB — COMPREHENSIVE METABOLIC PANEL
ALT: 119 U/L — ABNORMAL HIGH (ref 0–44)
AST: 91 U/L — ABNORMAL HIGH (ref 15–41)
Albumin: 4.4 g/dL (ref 3.5–5.0)
Alkaline Phosphatase: 113 U/L (ref 38–126)
Anion gap: 17 — ABNORMAL HIGH (ref 5–15)
BUN: 20 mg/dL (ref 8–23)
CO2: 17 mmol/L — ABNORMAL LOW (ref 22–32)
Calcium: 9.4 mg/dL (ref 8.9–10.3)
Chloride: 104 mmol/L (ref 98–111)
Creatinine, Ser: 1.54 mg/dL — ABNORMAL HIGH (ref 0.61–1.24)
GFR calc Af Amer: 54 mL/min — ABNORMAL LOW (ref >60)
GFR calc non Af Amer: 47 mL/min — ABNORMAL LOW (ref >60)
Glucose, Bld: 105 mg/dL — ABNORMAL HIGH (ref 70–99)
Potassium: 4.2 mmol/L (ref 3.5–5.1)
Sodium: 138 mmol/L (ref 135–145)
Total Bilirubin: 2.8 mg/dL — ABNORMAL HIGH (ref 0.3–1.2)
Total Protein: 6.9 g/dL (ref 6.5–8.1)

## 2019-05-04 LAB — CREATININE, SERUM
Creatinine, Ser: 1.66 mg/dL — ABNORMAL HIGH (ref 0.61–1.24)
GFR calc Af Amer: 49 mL/min — ABNORMAL LOW (ref >60)
GFR calc non Af Amer: 43 mL/min — ABNORMAL LOW (ref >60)

## 2019-05-04 LAB — MAGNESIUM: Magnesium: 2.3 mg/dL (ref 1.7–2.4)

## 2019-05-04 LAB — MRSA PCR SCREENING: MRSA by PCR: NEGATIVE

## 2019-05-04 LAB — ECHOCARDIOGRAM COMPLETE
Height: 71 in
Weight: 3830.71 oz

## 2019-05-04 LAB — CBC
HCT: 48.3 % (ref 39.0–52.0)
HCT: 48.7 % (ref 39.0–52.0)
Hemoglobin: 16.4 g/dL (ref 13.0–17.0)
Hemoglobin: 16.1 g/dL (ref 13.0–17.0)
MCH: 35 pg — ABNORMAL HIGH (ref 26.0–34.0)
MCH: 34.5 pg — ABNORMAL HIGH (ref 26.0–34.0)
MCHC: 34 g/dL (ref 30.0–36.0)
MCHC: 33.1 g/dL (ref 30.0–36.0)
MCV: 103.2 fL — ABNORMAL HIGH (ref 80.0–100.0)
MCV: 104.5 fL — ABNORMAL HIGH (ref 80.0–100.0)
Platelets: 163 10*3/uL (ref 150–400)
Platelets: 171 10*3/uL (ref 150–400)
RBC: 4.68 MIL/uL (ref 4.22–5.81)
RBC: 4.66 MIL/uL (ref 4.22–5.81)
RDW: 12.7 % (ref 11.5–15.5)
RDW: 12.9 % (ref 11.5–15.5)
WBC: 11.1 10*3/uL — ABNORMAL HIGH (ref 4.0–10.5)
WBC: 13.3 10*3/uL — ABNORMAL HIGH (ref 4.0–10.5)
nRBC: 0 % (ref 0.0–0.2)
nRBC: 0 % (ref 0.0–0.2)

## 2019-05-04 LAB — PROTIME-INR
INR: 1.1 (ref 0.8–1.2)
Prothrombin Time: 13.7 seconds (ref 11.4–15.2)

## 2019-05-04 LAB — HIV ANTIBODY (ROUTINE TESTING W REFLEX): HIV Screen 4th Generation wRfx: NONREACTIVE

## 2019-05-04 LAB — HEPATIC FUNCTION PANEL
ALT: 118 U/L — ABNORMAL HIGH (ref 0–44)
AST: 90 U/L — ABNORMAL HIGH (ref 15–41)
Albumin: 4.2 g/dL (ref 3.5–5.0)
Alkaline Phosphatase: 107 U/L (ref 38–126)
Bilirubin, Direct: 0.6 mg/dL — ABNORMAL HIGH (ref 0.0–0.2)
Indirect Bilirubin: 2.2 mg/dL — ABNORMAL HIGH (ref 0.3–0.9)
Total Bilirubin: 2.8 mg/dL — ABNORMAL HIGH (ref 0.3–1.2)
Total Protein: 7.3 g/dL (ref 6.5–8.1)

## 2019-05-04 LAB — PHOSPHORUS: Phosphorus: 3.8 mg/dL (ref 2.5–4.6)

## 2019-05-04 LAB — HEPATITIS PANEL, ACUTE
HCV Ab: NONREACTIVE
Hep A IgM: NONREACTIVE
Hep B C IgM: NONREACTIVE
Hepatitis B Surface Ag: NONREACTIVE

## 2019-05-04 LAB — BRAIN NATRIURETIC PEPTIDE: B Natriuretic Peptide: 29.6 pg/mL (ref 0.0–100.0)

## 2019-05-04 LAB — TSH: TSH: 0.918 u[IU]/mL (ref 0.350–4.500)

## 2019-05-04 LAB — LACTIC ACID, PLASMA: Lactic Acid, Venous: 1.4 mmol/L (ref 0.5–1.9)

## 2019-05-04 LAB — SARS CORONAVIRUS 2 (TAT 6-24 HRS): SARS Coronavirus 2: NEGATIVE

## 2019-05-04 MED ORDER — VITAMIN D 25 MCG (1000 UNIT) PO TABS
1000.0000 [IU] | ORAL_TABLET | Freq: Every day | ORAL | Status: DC
  Administered 2019-05-05 – 2019-05-15 (×11): 1000 [IU] via ORAL
  Filled 2019-05-04 (×11): qty 1

## 2019-05-04 MED ORDER — HYDROMORPHONE HCL 1 MG/ML PO LIQD: 1.0000 mg | ORAL | Status: DC | PRN

## 2019-05-04 MED ORDER — MORPHINE SULFATE (PF) 4 MG/ML IV SOLN
3.0000 mg | Freq: Once | INTRAVENOUS | Status: AC
  Administered 2019-05-04: 14:00:00 3 mg via INTRAVENOUS

## 2019-05-04 MED ORDER — PIPERACILLIN-TAZOBACTAM 3.375 G IVPB 30 MIN
3.3750 g | Freq: Three times a day (TID) | INTRAVENOUS | Status: DC
  Administered 2019-05-04: 3.375 g via INTRAVENOUS
  Filled 2019-05-04 (×2): qty 50

## 2019-05-04 MED ORDER — TECHNETIUM TC 99M MEBROFENIN IV KIT
5.1000 | PACK | Freq: Once | INTRAVENOUS | Status: AC | PRN
  Administered 2019-05-04: 5.1 via INTRAVENOUS

## 2019-05-04 MED ORDER — PERFLUTREN LIPID MICROSPHERE
1.0000 mL | INTRAVENOUS | Status: AC | PRN
  Administered 2019-05-04: 8 mL via INTRAVENOUS
  Filled 2019-05-04: qty 10

## 2019-05-04 MED ORDER — ASPIRIN 81 MG PO CHEW
81.0000 mg | CHEWABLE_TABLET | Freq: Every day | ORAL | Status: DC
  Administered 2019-05-05 – 2019-05-15 (×11): 81 mg via ORAL
  Filled 2019-05-04 (×11): qty 1

## 2019-05-04 MED ORDER — FUROSEMIDE 20 MG PO TABS
20.0000 mg | ORAL_TABLET | Freq: Every day | ORAL | Status: DC
  Administered 2019-05-05 – 2019-05-08 (×4): 20 mg via ORAL
  Filled 2019-05-04 (×4): qty 1

## 2019-05-04 MED ORDER — LEVOTHYROXINE SODIUM 25 MCG PO TABS
125.0000 ug | ORAL_TABLET | Freq: Every day | ORAL | Status: DC
  Administered 2019-05-05 – 2019-05-15 (×11): 125 ug via ORAL
  Filled 2019-05-04 (×11): qty 1

## 2019-05-04 MED ORDER — ENOXAPARIN SODIUM 40 MG/0.4ML ~~LOC~~ SOLN
40.0000 mg | SUBCUTANEOUS | Status: DC
  Administered 2019-05-04 – 2019-05-12 (×9): 40 mg via SUBCUTANEOUS
  Filled 2019-05-04 (×13): qty 0.4

## 2019-05-04 MED ORDER — PIPERACILLIN-TAZOBACTAM 3.375 G IVPB
3.3750 g | Freq: Three times a day (TID) | INTRAVENOUS | Status: DC
  Administered 2019-05-04 – 2019-05-09 (×15): 3.375 g via INTRAVENOUS
  Filled 2019-05-04 (×13): qty 50

## 2019-05-04 MED ORDER — MORPHINE SULFATE (PF) 4 MG/ML IV SOLN
INTRAVENOUS | Status: AC
  Administered 2019-05-04: 3 mg via INTRAVENOUS
  Filled 2019-05-04: qty 1

## 2019-05-04 MED ORDER — HYDROMORPHONE HCL 1 MG/ML IJ SOLN
1.0000 mg | INTRAMUSCULAR | Status: DC | PRN
  Administered 2019-05-04 – 2019-05-05 (×4): 1 mg via INTRAVENOUS
  Filled 2019-05-04 (×5): qty 1

## 2019-05-04 MED ORDER — PNEUMOCOCCAL VAC POLYVALENT 25 MCG/0.5ML IJ INJ
0.5000 mL | INJECTION | INTRAMUSCULAR | Status: AC
  Administered 2019-05-09: 0.5 mL via INTRAMUSCULAR
  Filled 2019-05-04 (×2): qty 0.5

## 2019-05-04 MED ORDER — METOPROLOL TARTRATE 12.5 MG HALF TABLET
12.5000 mg | ORAL_TABLET | Freq: Two times a day (BID) | ORAL | Status: DC
  Administered 2019-05-04 – 2019-05-15 (×23): 12.5 mg via ORAL
  Filled 2019-05-04 (×23): qty 1

## 2019-05-04 MED ORDER — LEVOTHYROXINE SODIUM 25 MCG PO TABS: 125.0000 ug | ORAL_TABLET | Freq: Every day | ORAL | Status: DC

## 2019-05-04 NOTE — Progress Notes (Signed)
PROGRESS NOTE  Randy Beasley  DOB: 03-Jan-1956  PCP: Pearson Forster, MD VWU:[id number]  DOA: 05/03/2019  LOS: 1 day   Chief Complaint  Patient presents with  . Chest Pain   Brief narrative: Randy Beasley is a 63 y.o. male with PMH of HTN, CAD, MI s/p stent, CHF, ischemic cardiomyopathy EF 25 to 30%, hypothyroidism, seizures, hepatic steatosis. Patient presented to the ED on 05/03/2019 with complaint of abdominal pain radiating to the chest started after dinner, seated with nausea and bilious emesis.  In the ED, patient was afebrile and hemodynamically stable.  He had tenderness in the right upper quadrant. Work-up showed WBC 11.7, potassium 3.8, BUN/creatinine 19/1.91, AST/ALT/bilirubin elevated to 109/127/2.1  CT angio of the chest and abdomen revealed: 1. Findings suspicious of acute cholecystitis which include abnormal gallbladder with stones/sludge, evidence of pericholecystic inflammation, and superimposed pneumobilia. 2. Possible hepatic Cirrhosis, but no stigmata of portal venous hypertension. Extensive calcified coronary artery atherosclerosis and/or stent.   Patient was admitted under hospitalist medicine service for acute cholecystitis.  IV Zosyn was started.  General surgery as well as cardiology consultation was obtained.  Subjective: Patient was seen and examined this morning.  Pleasant middle-aged Caucasian male. Lying down in bed.  Pain at this time is tolerable in the right upper quadrant. His biggest concern is having nothing to eat or drink for last 16 hours.  Assessment/Plan: Acute cholecystitis  -Currently on IV Zosyn.  Pain controlled.   -General surgery consultation obtained.  Pending cardiology clearance for any surgical intervention.  Pending HIDA scan.    Possible liver cirrhosis -Presented with elevated liver enzymes and bilirubin -Nodular contour of liver seen in imaging.  No evidence of portal hypertension at this time. -Likely hepatic steatosis  is the cause.  Acute kidney injury on CKD stage 3a -Baseline creatinine 1.3, CKD stage 3a -Presented with creatinine elevated to 1.91, gradually improving with hydration, 1.54 this morning.  History of systolic congestive heart failure: -Does not seem decompensated at this time.   -Home meds include Lasix, metoprolol, ramipril -Last echo was 4-5 years ago, with EF of 25 to 30%  Coronary artery disease/history of NSTEMI status post PCI: No chest pain. Cardiac enzymes negative. On aspirin 81 mg daily at home.  Hypothyroidism -continue Synthroid   Depression - continue BuSpar.    Mobility: Encourage ambulation DVT prophylaxis:  Subcu Lovenox Code Status:   Code Status: Full Code  Family Communication:  Not at bedside Expected Discharge:  Anticipate 2-3 more days of hospital stay.  Consultants:  Cardiology, general surgery  Procedures:  None  Antimicrobials: Anti-infectives (From admission, onward)   Start     Dose/Rate Route Frequency Ordered Stop   05/04/19 0600  piperacillin-tazobactam (ZOSYN) IVPB 3.375 g     3.375 g 100 mL/hr over 30 Minutes Intravenous Every 8 hours 05/04/19 0228     05/03/19 2200  piperacillin-tazobactam (ZOSYN) IVPB 3.375 g     3.375 g 100 mL/hr over 30 Minutes Intravenous  Once 05/03/19 2152 05/04/19 0011      Diet Order            Diet clear liquid Room service appropriate? Yes; Fluid consistency: Thin  Diet effective ____        Diet NPO time specified Except for: Sips with Meds  Diet effective now              Infusions:  . piperacillin-tazobactam Stopped (05/04/19 2956)    Scheduled Meds: . aspirin  81  mg Oral Daily  . cholecalciferol  1,000 Units Oral Daily  . enoxaparin (LOVENOX) injection  40 mg Subcutaneous Q24H  . furosemide  20 mg Oral Daily  . levothyroxine  125 mcg Oral Daily  . metoprolol tartrate  12.5 mg Oral BID    PRN meds: HYDROmorphone (DILAUDID) injection   Objective: Vitals:   05/04/19 0900  05/04/19 1000  BP: 121/85 128/90  Pulse: 80 75  Resp:    Temp:    SpO2: 91% 91%   No intake or output data in the 24 hours ending 05/04/19 1038 There were no vitals filed for this visit. Weight change:  There is no height or weight on file to calculate BMI.   Physical Exam: General exam: Appears calm and comfortable.  Skin: No rashes, lesions or ulcers. HEENT: Atraumatic, normocephalic, supple neck, no obvious bleeding Lungs: Clear to auscultation bilaterally CVS: Regular rate and rhythm, no murmur GI/Abd soft, tenderness present in right upper quadrant, bowel sound present CNS: Alert, awake, oriented x3 Psychiatry: Mood appropriate Extremities: No pedal edema no calf tenderness  Data Review: I have personally reviewed the laboratory data and studies available.  Recent Labs  Lab 05/03/19 2040 05/03/19 2051 05/04/19 0012 05/04/19 0330  WBC 11.7*  --  11.1* 13.3*  HGB 16.8 17.0 16.4 16.1  HCT 47.1 50.0 48.3 48.7  MCV 99.2  --  103.2* 104.5*  PLT 209  --  163 171   Recent Labs  Lab 05/03/19 2040 05/03/19 2051 05/04/19 0012 05/04/19 0330  NA 137 139  --  138  K 3.8 3.9  --  4.2  CL 103 104  --  104  CO2 20*  --   --  17*  GLUCOSE 131* 125*  --  105*  BUN 19 20  --  20  CREATININE 1.91* 1.80* 1.66* 1.54*  CALCIUM 10.0  --   --  9.4  MG  --   --  2.3  --   PHOS  --   --  3.8  --     Terrilee Croak, MD  [facility] 05/04/2019

## 2019-05-04 NOTE — ED Notes (Signed)
Ordered diet tray for pt  

## 2019-05-04 NOTE — Consult Note (Addendum)
Consultation  Referring Provider: [facility] /Dr. Gershon Crane primary Care Physician:  Ivan Anchors, MD Primary Gastroenterologist: None.  Reason for Consultation: Cirrhosis  HPI: Randy Beasley is a 63 y.o. male, who is being admitted through the emergency room today after he presented last night with acute onset of severe epigastric pain radiating into his back and up into his chest, onset after dinner.  He tried aspirin and nitroglycerin without any benefit.  Patient related his pain was similar to pain with prior MI.  He was brought to the ER by EMS. Initial cardiac work-up negative. CT angio of the chest abdomen and pelvis shows pneumobilia in the liver, common bile duct and gas in the lumen of the gallbladder, there are stones and sludge in the gallbladder and evidence of pericholecystic fluid.  The liver does have a nodular contour, no pneumoperitoneum no ascites or free fluid. Upper abdominal ultrasound was also done showing gallbladder wall of 6 mm, gallstones ,CBD 3 mm nodular appearing liver and portal vein  Labs-bili 2.8/alk phos 113/AST 91/ALT 119 Creatinine 1.54 WBC 13.3, hemoglobin 16.1, MCV 104 platelets 171 Lactate 1.4 INR 1.1 COVID-19 negative  Patient has history of coronary artery disease he status post MI in 2015 with drug-eluting stent to the LAD, has history of hyperlipidemia, and history of elevated LFTs with statins.  history of EtOH use, seizure disorder, spinal stenosis, and ischemic cardiomyopathy with EF of 35%.  He previously declined an ICD.  Patient says he has never been told that he had cirrhosis in the past, he has had problems with elevated liver enzymes.  Reviewing his chart LFTs were elevated in 2017.  Initially this was felt secondary to statins, however they remained elevated off statins.  He had significant hyperlipidemia. No family history of liver disease.  No prior history of hepatitis that he is aware of.  He says he had been a fairly regular drinker  in the past years though not necessarily excessively.  He says he would primarily drink on weekends.  Over the past 6 months he has been drinking on a daily basis, usually for 5 beers per day.  He is also restarted smoking which she had quit.  But says he is only smoking 2 or 3 cigarettes/day.  He has been under a lot of stress over the past 6 months.  He continues to complain of severe epigastric pain radiating to his back   Past Medical History:  Diagnosis Date  . Abnormal LFTs   . Anxiety   . CAD (coronary artery disease)    residual disease to LCX and RCA  . Chronic systolic CHF (congestive heart failure) ([place]) 04/05/2015  . Hypothyroidism   . Ischemic cardiomyopathy    EF 25-30%  . Osteoarthritis   . Seizures ([place])    last was > 20 years ago  . Spinal stenosis   . STEMI (ST elevation myocardial infarction) ([place]) 04/28/14   with stent to LAD    Past Surgical History:  Procedure Laterality Date  . APPENDECTOMY    . BACK SURGERY    . CORONARY ANGIOPLASTY WITH STENT PLACEMENT  04/28/14   Promus DES to LAD  . LEFT HEART CATH N/A 04/28/2014   Procedure: LEFT HEART CATH;  Surgeon: Peter M Martinique, MD;  Location: [facility] CATH LAB;  Service: Cardiovascular;  Laterality: N/A;  . NECK SURGERY    . TONSILLECTOMY      Prior to Admission medications   Medication Sig Start Date End Date  Taking? Authorizing Provider  Acetaminophen (TYLENOL PO) Take 2 tablets by mouth daily as needed (pain). Pt unsure of strength (08/10/14)   Yes [provider]  aspirin 81 MG tablet Take 81 mg by mouth daily.   Yes [provider]  busPIRone (BUSPAR) 10 MG tablet Take 10 mg by mouth 3 (three) times daily as needed. Anxiety 04/04/14  Yes [provider]  furosemide (LASIX) 20 MG tablet TAKE 1 TABLET BY MOUTH EVERY DAY Patient taking differently: Take 20 mg by mouth daily.  11/26/18  Yes Martinique, Peter M, MD  levothyroxine (SYNTHROID) 125 MCG tablet Take 125 mcg by mouth daily. 08/30/18   Yes [provider]  metoprolol tartrate (LOPRESSOR) 25 MG tablet TAKE ONE-HALF TABLET BY MOUTH TWICE DAILY Patient taking differently: Take 12.5 mg by mouth 2 (two) times daily.  03/08/18  Yes Martinique, Peter M, MD  nitroGLYCERIN (NITROSTAT) 0.4 MG SL tablet Place 1 tablet (0.4 mg total) under the tongue every 5 (five) minutes x 3 doses as needed for chest pain. 11/22/18  Yes Martinique, Peter M, MD  ramipril (ALTACE) 5 MG capsule TAKE 1 CAPSULE BY MOUTH EVERY DAY Patient taking differently: Take 5 mg by mouth daily.  04/11/19  Yes Martinique, Peter M, MD  Vitamin D, Cholecalciferol, 25 MCG (1000 UT) TABS Take 1 tablet by mouth daily.   Yes [provider]    Current Facility-Administered Medications  Medication Dose Route Frequency Provider Last Rate Last Dose  . aspirin chewable tablet 81 mg  81 mg Oral Daily Bonnell Public, MD   Stopped at 05/04/19 1014  . cholecalciferol (VITAMIN D3) tablet 1,000 Units  1,000 Units Oral Daily Bonnell Public, MD   Stopped at 05/04/19 1016  . enoxaparin (LOVENOX) injection 40 mg  40 mg Subcutaneous Q24H Dana Allan I, MD   40 mg at 05/04/19 0446  . furosemide (LASIX) tablet 20 mg  20 mg Oral Daily Bonnell Public, MD   Stopped at 05/04/19 1015  . HYDROmorphone (DILAUDID) injection 0.5 mg  0.5 mg Intravenous Q2H PRN Dana Allan I, MD   0.5 mg at 05/04/19 0544  . levothyroxine (SYNTHROID) tablet 125 mcg  125 mcg Oral Daily Bonnell Public, MD   Stopped at 05/04/19 1016  . metoprolol tartrate (LOPRESSOR) tablet 12.5 mg  12.5 mg Oral BID Bonnell Public, MD   Stopped at 05/04/19 1015  . piperacillin-tazobactam (ZOSYN) IVPB 3.375 g  3.375 g Intravenous Q8H Bonnell Public, MD   Stopped at 05/04/19 0620   Current Outpatient Medications  Medication Sig Dispense Refill  . Acetaminophen (TYLENOL PO) Take 2 tablets by mouth daily as needed (pain). Pt unsure of strength (08/10/14)    . aspirin 81 MG tablet Take 81 mg by mouth  daily.    . busPIRone (BUSPAR) 10 MG tablet Take 10 mg by mouth 3 (three) times daily as needed. Anxiety  10  . furosemide (LASIX) 20 MG tablet TAKE 1 TABLET BY MOUTH EVERY DAY (Patient taking differently: Take 20 mg by mouth daily. ) 90 tablet 3  . levothyroxine (SYNTHROID) 125 MCG tablet Take 125 mcg by mouth daily.    . metoprolol tartrate (LOPRESSOR) 25 MG tablet TAKE ONE-HALF TABLET BY MOUTH TWICE DAILY (Patient taking differently: Take 12.5 mg by mouth 2 (two) times daily. ) 90 tablet 4  . nitroGLYCERIN (NITROSTAT) 0.4 MG SL tablet Place 1 tablet (0.4 mg total) under the tongue every 5 (five) minutes x 3 doses as needed  for chest pain. 25 tablet 12  . ramipril (ALTACE) 5 MG capsule TAKE 1 CAPSULE BY MOUTH EVERY DAY (Patient taking differently: Take 5 mg by mouth daily. ) 90 capsule 0  . Vitamin D, Cholecalciferol, 25 MCG (1000 UT) TABS Take 1 tablet by mouth daily.      Allergies as of 05/03/2019  . (No Known Allergies)    Family History  Problem Relation Age of Onset  . Hypertension Mother   . Peripheral vascular disease Mother   . Stroke Father   . Heart disease Brother     Social History   Socioeconomic History  . Marital status: Single    Spouse name: Not on file  . Number of children: Not on file  . Years of education: Not on file  . Highest education level: Not on file  Occupational History  . Not on file  Social Needs  . Financial resource strain: Not on file  . Food insecurity    Worry: Not on file    Inability: Not on file  . Transportation needs    Medical: Not on file    Non-medical: Not on file  Tobacco Use  . Smoking status: Former Smoker    Packs/day: 0.00    Years: 42.00    Pack years: 0.00    Types: Cigarettes    Quit date: 07/29/2013    Years since quitting: 5.7  . Smokeless tobacco: Former Systems developer    Quit date: 02/14/1982  Substance and Sexual Activity  . Alcohol use: Yes    Alcohol/week: 3.0 - 5.0 standard drinks    Types: 3 - 5 Cans of beer per  week    Comment: previously a case of beer a week, 3-4 beers a day, 5 years.  Has not drank in several weeks.  . Drug use: No  . Sexual activity: Yes  Lifestyle  . Physical activity    Days per week: Not on file    Minutes per session: Not on file  . Stress: Not on file  Relationships  . Social Herbalist on phone: Not on file    Gets together: Not on file    Attends religious service: Not on file    Active member of club or organization: Not on file    Attends meetings of clubs or organizations: Not on file    Relationship status: Not on file  . Intimate partner violence    Fear of current or ex partner: Not on file    Emotionally abused: Not on file    Physically abused: Not on file    Forced sexual activity: Not on file  Other Topics Concern  . Not on file  Social History Narrative   Lives in [place].  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  All other review of systems was otherwise negative.  Physical Exam: Vital signs in last 24 hours: Temp:  [98.7 F (37.1 C)] 98.7 F (37.1 C) (11/17 2155) Pulse Rate:  [65-100] 75 (11/18 1000) Resp:  [16-33] 19 (11/17 2252) BP: (102-147)/(56-98) 128/90 (11/18 1000) SpO2:  [90 %-100 %] 91 % (11/18 1000)   General:   Alert,  Well-developed, well-nourished, older white male pleasant and cooperative in NAD Head:  Normocephalic and atraumatic. Eyes:  Sclera icteric   conjunctiva pink. Ears:  Normal auditory acuity.  Nose:  No deformity, discharge,  or lesions. Mouth:  No deformity or lesions.   Neck:  Supple; no masses or thyromegaly. Lungs:  Clear throughout to auscultation.   No wheezes, crackles, or rhonchi.  Heart:  Regular rate and rhythm; no murmurs, clicks, rubs,  or gallops. Abdomen:  Soft, obese, bowel sounds present, he is quite tender in the epigastrium with  guarding into the right upper quadrant.  No appreciable fluid wave Rectal:  Deferred  Msk:  Symmetrical without gross deformities. . Pulses:  Normal pulses noted. Extremities:  Without clubbing or edema. Neurologic:  Alert and  oriented x4;  grossly normal neurologically. Skin:  Intact without significant lesions or rashes.. Psych:  Alert and cooperative. Normal mood and affect.  Intake/Output from previous day: No intake/output data recorded. Intake/Output this shift: No intake/output data recorded.  Lab Results: Recent Labs    05/03/19 2040 05/03/19 2051 05/04/19 0012 05/04/19 0330  WBC 11.7*  --  11.1* 13.3*  HGB 16.8 17.0 16.4 16.1  HCT 47.1 50.0 48.3 48.7  PLT 209  --  163 171   BMET Recent Labs    05/03/19 2040 05/03/19 2051 05/04/19 0012 05/04/19 0330  NA 137 139  --  138  K 3.8 3.9  --  4.2  CL 103 104  --  104  CO2 20*  --   --  17*  GLUCOSE 131* 125*  --  105*  BUN 19 20  --  20  CREATININE 1.91* 1.80* 1.66* 1.54*  CALCIUM 10.0  --   --  9.4   LFT Recent Labs    05/04/19 0330  PROT 6.9  7.3  ALBUMIN 4.4  4.2  AST 91*  90*  ALT 119*  118*  ALKPHOS 113  107  BILITOT 2.8*  2.8*  BILIDIR 0.6*  IBILI 2.2*   PT/INR Recent Labs    05/04/19 0330  LABPROT 13.7  INR 1.1   Hepatitis Panel No results for input(s): HEPBSAG, HCVAB, HEPAIGM, HEPBIGM in the last 72 hours.  IMPRESSION:  #77 63 year old white male with acute emphysematous cholecystitis, presenting with abrupt onset of severe epigastric pain radiating into the chest and back last night.  #2 Coronary artery disease status post MI and prior drug-eluting stent to LAD #3 ischemic cardiomyopathy with EF 35% #4 hyperlipidemia #5 new finding of cirrhosis on imaging today-.  Cirrhosis appears to be compensated.  No coagulopathy, no ascites, normal platelet count.  Etiology of cirrhosis not entirely clear, suspect combination of EtOH and fatty liver. Acute hepatitis serologies pending   #6 elevated LFTs--Acutely this may be secondary to acute cholecystitis, especially the elevated bili, however patient does have history of transaminitis present back to at least 2017.  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  PLAN:  Patient appears to have compensated Cirrhosis, and therefore acceptable risk for surgery. He is advised to discontinue all EtOH. Will follow up on hepatitis serologies He will need outpatient follow-up with GI for his new diagnosis of cirrhosis, he will be established with Dr. Loletha Carrow and we will arrange follow-up after he recovers from current acute illness.  Consider CIWA protocol given recent increase EtOH use.    Amy EsterwoodPA-C  05/04/2019, 11:25 AM  I have reviewed the entire case in detail with the above APP and discussed the plan in detail.  Therefore, I agree with the diagnoses recorded above. In addition,  I have personally interviewed and examined the patient and have personally reviewed any abdominal/pelvic CT scan images.  My additional thoughts are as follows:  New diagnosis of cirrhosis, compensated with no clinical evidence of portal hypertension.  Acute hepatitis panel has returned negative.  This is most likely from excess alcohol use.  Even compensated cirrhosis carry some increased risk of surgical complications, but he has severe cholecystitis requiring surgery.  I recommend proceeding with cholecystectomy, no specific treatments related to the cirrhosis are necessary perioperatively.  We will arrange follow-up with our clinic in 6 to 8 weeks.   Nelida Meuse III [phone]

## 2019-05-04 NOTE — Progress Notes (Signed)
Pt admitted to 5W28 from ED. A&O x4. Tele monitor placed, VS stable. Skin intact. IV L hand & R AC. Ambulatory in room. All questions/concerns addressed. Call bell within reach. Will continue to monitor.

## 2019-05-04 NOTE — Progress Notes (Signed)
Attempted echo in ED. Per charge nurse, echo to be done upstairs.

## 2019-05-04 NOTE — Consult Note (Addendum)
Cardiology Consultation:   Patient ID: Randy Beasley MRN: [id number]; DOB: 1956-05-21  Admit date: 05/03/2019 Date of Consult: 05/04/2019  Primary Care Provider: Pearson Forster, MD Primary Cardiologist: Randy Swaziland, MD  Primary Electrophysiologist:  None   Patient Profile:   Randy Beasley is a 63 y.o. male with a hx of STEMI 2015 s/p DES LAD residual disease LCx 60-70% and RCA 50% treated medically, ischemic cardiomyopathy, Chronic systolic and diastolic HF (EF 95-28% 2016), ETOH abuse, tobacco abuse, hypothyroidism, statin intolerance (transaminits) who is being seen today for the evaluation of pre-op evaluation for possible cholecystectomy vs drain at the request of Dr. Pola Beasley.  History of Present Illness:   Randy Beasley follows with Dr. Swaziland for the above cardiac issues. He had a STEMI in 2015 s/p DES LAD with residual as above, treated medically. EF at that time was 25-30%, G2DD. He was started on statins but was found to have transaminitis. He was not a candidate for research due to elevated transaminases and he was unable to afford Zetia or PCSK9i. His last echo was 2016  Showed mildly improved EF 30-35%, G1DD. His last Tele-visit was 11/2018 and was doing well from a cardiac standpoint. He reported brief minimal chest pain, no trouble breathing or lower leg swelling. His activity was limited by back pain. He was on Aspirin daily, lasix 20 mg daily, Metoprolol 25 mg BID, ramipril 5 mg daily.   The patient presented to the ED 05/03/19 for severe abdominal pain. Pain began right after dinner last night. It was a sudden onset and sharp in the middle of his abdomen. He took something for acid reflux but it didn't help. He felt diaphoretic and nauseas as well.  The pain radiated up to his lower chest and into his back. He tried to take NTG but vomited it up. He vomited multiple times. EMS was called and and they administered ASA, NTG, morphine without improvement. Patient felt as sick as  when he had his MI, but the pain at that time started in this chest. He said he had 2 similar previous attacks in the last 8 months but without chest pain.   In the ED BP 132/98, pulse 72, RR 24, O2 100%. Labs showed WBC 11. > 13.3, glucose 131, AST 109, ALT 127, t bili 2.2, creatinine 1.91, potassium 3.8. Hs troponin 10 > 9. EKG negative for ischemic changes. CT angio chest showed possible acute cholecystitis and possible hepatic cirrhosis. Patient was started on IV Zosyn and was admitted. General surgery was consulted.   On exam the patient still has abdominal and back pain, but no chest pain. The patient quit smoking 5 years ago (smoked for 42 years), but recently started again 6 months ago (2-3 cigarettes daily). He drinks 4 beers daily. Denies drug use. Patient is able to care for himself, do moderate work around the house, and walk a block. He can walk up a flight of stairs but begins to get SOB. He denies recent weight gain or orthopnea. The patient is not normally active, but has been moving boxes recently and denies CP or SOB. He reports compliance with his medications.   Heart Pathway Score:     Past Medical History:  Diagnosis Date   Abnormal LFTs    Anxiety    CAD (coronary artery disease)    residual disease to LCX and RCA   Chronic systolic CHF (congestive heart failure) (HCC) 04/05/2015   Hypothyroidism    Ischemic cardiomyopathy  EF 25-30%   Osteoarthritis    Seizures (HCC)    last was > 20 years ago   Spinal stenosis    STEMI (ST elevation myocardial infarction) (HCC) 04/28/14   with stent to LAD    Past Surgical History:  Procedure Laterality Date   APPENDECTOMY     BACK SURGERY     CORONARY ANGIOPLASTY WITH STENT PLACEMENT  04/28/14   Promus DES to LAD   LEFT HEART CATH N/A 04/28/2014   Procedure: LEFT HEART CATH;  Surgeon: Randy M Swaziland, MD;  Location: [facility] CATH LAB;  Service: Cardiovascular;  Laterality: N/A;   NECK SURGERY     TONSILLECTOMY         Home Medications:  Prior to Admission medications   Medication Sig Start Date End Date Taking? Authorizing Provider  Acetaminophen (TYLENOL PO) Take 2 tablets by mouth daily as needed (pain). Pt unsure of strength (08/10/14)   Yes [provider]  aspirin 81 MG tablet Take 81 mg by mouth daily.   Yes [provider]  busPIRone (BUSPAR) 10 MG tablet Take 10 mg by mouth 3 (three) times daily as needed. Anxiety 04/04/14  Yes [provider]  furosemide (LASIX) 20 MG tablet TAKE 1 TABLET BY MOUTH EVERY DAY Patient taking differently: Take 20 mg by mouth daily.  11/26/18  Yes Beasley, Randy M, MD  levothyroxine (SYNTHROID) 125 MCG tablet Take 125 mcg by mouth daily. 08/30/18  Yes [provider]  metoprolol tartrate (LOPRESSOR) 25 MG tablet TAKE ONE-HALF TABLET BY MOUTH TWICE DAILY Patient taking differently: Take 12.5 mg by mouth 2 (two) times daily.  03/08/18  Yes Beasley, Randy M, MD  nitroGLYCERIN (NITROSTAT) 0.4 MG SL tablet Place 1 tablet (0.4 mg total) under the tongue every 5 (five) minutes x 3 doses as needed for chest pain. 11/22/18  Yes Beasley, Randy M, MD  ramipril (ALTACE) 5 MG capsule TAKE 1 CAPSULE BY MOUTH EVERY DAY Patient taking differently: Take 5 mg by mouth daily.  04/11/19  Yes Beasley, Randy M, MD  Vitamin D, Cholecalciferol, 25 MCG (1000 UT) TABS Take 1 tablet by mouth daily.   Yes [provider]    Inpatient Medications: Scheduled Meds:  aspirin  81 mg Oral Daily   cholecalciferol  1,000 Units Oral Daily   enoxaparin (LOVENOX) injection  40 mg Subcutaneous Q24H   furosemide  20 mg Oral Daily   levothyroxine  125 mcg Oral Daily   metoprolol tartrate  12.5 mg Oral BID   Continuous Infusions:  piperacillin-tazobactam Stopped (05/04/19 0620)   PRN Meds: HYDROmorphone (DILAUDID) injection  Allergies:   No Known Allergies  Social History:   Social History   Socioeconomic History   Marital status: Single    Spouse  name: Not on file   Number of children: Not on file   Years of education: Not on file   Highest education level: Not on file  Occupational History   Not on file  Social Needs   Financial resource strain: Not on file   Food insecurity    Worry: Not on file    Inability: Not on file   Transportation needs    Medical: Not on file    Non-medical: Not on file  Tobacco Use   Smoking status: Former Smoker    Packs/day: 0.00    Years: 42.00    Pack years: 0.00    Types: Cigarettes    Quit date: 07/29/2013    Years since quitting: 5.7  Smokeless tobacco: Former NeurosurgeonUser    Quit date: 02/14/1982  Substance and Sexual Activity   Alcohol use: Yes    Alcohol/week: 3.0 - 5.0 standard drinks    Types: 3 - 5 Cans of beer per week    Comment: previously a case of beer a week, 3-4 beers a day, 5 years.  Has not drank in several weeks.   Drug use: No   Sexual activity: Yes  Lifestyle   Physical activity    Days per week: Not on file    Minutes per session: Not on file   Stress: Not on file  Relationships   Social connections    Talks on phone: Not on file    Gets together: Not on file    Attends religious service: Not on file    Active member of club or organization: Not on file    Attends meetings of clubs or organizations: Not on file    Relationship status: Not on file   Intimate partner violence    Fear of current or ex partner: Not on file    Emotionally abused: Not on file    Physically abused: Not on file    Forced sexual activity: Not on file  Other Topics Concern   Not on file  Social History Narrative   [place]  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  All other ROS  reviewed and negative.     Physical Exam/Data:   Vitals:   05/04/19 0701 05/04/19 0800 05/04/19 0900 05/04/19 1000  BP:  (!) 102/56 121/85 128/90  Pulse: 81 92 80 75  Resp:      Temp:      TempSrc:      SpO2: 91% 98% 91% 91%   No intake or output data in the 24 hours ending 05/04/19 1149 Last 3 Weights 02/22/2018 07/14/2017 12/29/2016  Weight (lbs) 249 lb 3.2 oz 243 lb 236 lb 9.6 oz  Weight (kg) 113.036 kg 110.224 kg 107.321 kg     There is no height or weight on file to calculate BMI.  General:  Well nourished, well developed, in no acute distress HEENT: normal Lymph: no adenopathy Neck: no JVD Endocrine:  No thryomegaly Vascular: No carotid bruits; FA pulses 2+ bilaterally without bruits  Cardiac:  normal S1, S2; RRR; no murmur  Lungs:  clear to auscultation bilaterally, no wheezing, rhonchi or rales  Abd: soft, +tender, no hepatomegaly  Ext: no edema Musculoskeletal:  No deformities, BUE and BLE strength normal and equal Skin: warm and dry  Neuro:  CNs 2-12 intact, no focal abnormalities noted Psych:  Normal affect   EKG:  The EKG was personally reviewed and demonstrates:  NSR, 69 bpm, Lad, q waves anteroseptal leads, old TWI V1, poor R wave progression. NO ST elevation Telemetry:  Telemetry was personally reviewed and demonstrates:  NSR, HR 80-90s  Relevant CV Studies:  Echo 07/2014 - Left ventricle: The cavity size was normal. Wall thickness was  normal. Systolic function was moderately to severely reduced. The  estimated ejection fraction was in the range of 30% to 35%.  Dyskinesis of the apical myocardium. Hypokinesis of the  anteroseptal, anterior, and anterolateral myocardium. Doppler  parameters are consistent with abnormal left ventricular  relaxation (grade 1 diastolic dysfunction). Cannot exclude apical  thrombus.  - Pericardium, extracardiac: There was a left pleural effusion.   Laboratory Data:  High Sensitivity Troponin:   Recent Labs    Lab 05/03/19 2040 05/03/19 2224  TROPONINIHS 10 9     Chemistry Recent Labs  Lab 05/03/19 2040 05/03/19 2051 05/04/19 0012 05/04/19 0330  NA 137 139  --  138  K 3.8 3.9  --  4.2  CL 103 104  --  104  CO2 20*  --   --  17*  GLUCOSE 131* 125*  --  105*  BUN 19 20  --  20  CREATININE 1.91* 1.80* 1.66* 1.54*  CALCIUM 10.0  --   --  9.4  GFRNONAA 36*  --  43* 47*  GFRAA 42*  --  49* 54*  ANIONGAP 14  --   --  17*    Recent Labs  Lab 05/03/19 2040 05/04/19 0330  PROT 7.3 6.9   7.3  ALBUMIN 4.6 4.4   4.2  AST 109* 91*   90*  ALT 127* 119*   118*  ALKPHOS 106 113   107  BILITOT 2.1* 2.8*   2.8*   Hematology Recent Labs  Lab 05/03/19 2040 05/03/19 2051 05/04/19 0012 05/04/19 0330  WBC 11.7*  --  11.1* 13.3*  RBC 4.75  --  4.68 4.66  HGB 16.8 17.0 16.4 16.1  HCT 47.1 50.0 48.3 48.7  MCV 99.2  --  103.2* 104.5*  MCH 35.4*  --  35.0* 34.5*  MCHC 35.7  --  34.0 33.1  RDW 12.3  --  12.7 12.9  PLT 209  --  163 171   BNPNo results for input(s): BNP, PROBNP in the last 168 hours.  DDimer No results for input(s): DDIMER in the last 168 hours.   Radiology/Studies:  Dg Chest Portable 1 View  Result Date: 05/03/2019 CLINICAL DATA:  Chest pain EXAM: PORTABLE CHEST 1 VIEW COMPARISON:  05/01/2014 FINDINGS: No focal consolidation or effusion. Borderline cardiomegaly. No pneumothorax. IMPRESSION: No active disease.  Borderline cardiomegaly Electronically Signed   By: Jasmine Pang M.D.   On: 05/03/2019 20:42   Ct Angio Chest/abd/pel For Dissection W And/or Wo Contrast  Addendum Date: 05/03/2019   ADDENDUM REPORT: 05/03/2019 21:57 ADDENDUM: Study discussed by telephone with Dr. Silverio Lay in the ED on 05/03/2019 at 2151 hours. Electronically Signed   By: Odessa Fleming M.D.   On: 05/03/2019 21:57   Result Date: 05/03/2019 CLINICAL DATA:  63 year old male with chest pain radiating to the back and abdomen since 1800 hours. EXAM: CT ANGIOGRAPHY CHEST, ABDOMEN AND PELVIS TECHNIQUE:  Multidetector CT imaging through the chest, abdomen and pelvis was performed using the standard protocol during bolus administration of intravenous contrast. Multiplanar reconstructed images and MIPs were obtained and reviewed to evaluate the vascular anatomy. CONTRAST:  80mL OMNIPAQUE IOHEXOL 350 MG/ML SOLN COMPARISON:  Portable chest earlier today. FINDINGS: CTA CHEST FINDINGS Cardiovascular: Extensive calcified coronary artery atherosclerosis and/or stent on series 5, image 32. But there is minimal calcified plaque of the thoracic aorta. Negative for thoracic aortic aneurysm or dissection. Cardiac size at the upper limits of normal. No pericardial effusion. Mediastinum/Nodes: Negative. Lungs/Pleura: Major airways are patent. Trace apical scarring and paraseptal emphysema. There are 2 small subpleural and possibly sub solid right lung nodules on series 7, images 56 and 61, 3-4 millimeters. Otherwise the lungs are  clear. No pleural effusion. Musculoskeletal: No acute osseous abnormality identified. Review of the MIP images confirms the above findings. CTA ABDOMEN AND PELVIS FINDINGS VASCULAR Aortoiliac calcified atherosclerosis. Ectatic infrarenal abdominal aorta up to 26 millimeters diameter, but no aortic aneurysm or dissection. Mild ectasia also of the bilateral iliac arteries which are patent. Visible proximal femoral arteries remain patent. The major branches of the abdominal aorta are patent, with only mild atherosclerosis Review of the MIP images confirms the above findings. NON-VASCULAR Hepatobiliary: There is pneumobilia in the liver and in the CBD. And there is also a moderate volume of gas within the lumen of the gallbladder, non dependent on series 6, image 158. No gas identified within the wall of the gallbladder. Irregular stones, sludge, and debris also within the gallbladder lumen. And on coronal image 55 and series 6, image 160 there is evidence of some pericholecystic inflammation. The wall at the  fundus appears normal where it is distended by gas. The liver contour appears nodular. No discrete liver lesion. Questionable hyperenhancement along the gallbladder fossa. Pancreas: Negative. Spleen: Diminutive, negative. Adrenals/Urinary Tract: Normal adrenal glands. Bilateral renal enhancement is symmetric and within normal limits. No hydronephrosis. Decompressed ureters. Unremarkable urinary bladder. No nephrolithiasis identified. Stomach/Bowel: Decompressed and negative rectum. Redundant sigmoid colon with extensive diverticulosis in the proximal segment but no active inflammation identified. Lesser diverticulosis throughout the decompressed descending colon with no active inflammation. Occasional diverticula also in the transverse colon. The right colon is on a lax mesentery. No large bowel inflammation identified. The cecum is in the right upper quadrant. The appendix is diminutive or absent. Negative terminal ileum. No dilated small bowel. Negative stomach. Small duodenal diverticulum on coronal image 83. No inflammation. No pneumoperitoneum. No free fluid. Lymphatic: No lymphadenopathy. Reproductive: Negative. Other: No pelvic free fluid. Musculoskeletal: No acute osseous abnormality identified. Review of the MIP images confirms the above findings. IMPRESSION: 1. Negative for aortic dissection or aneurysm, but positive for ectatic abdominal aorta at risk for aneurysm development. Recommend followup by Ultrasound in 5 years. This recommendation follows ACR consensus guidelines: White Paper of the ACR Incidental Findings Committee II on Vascular Findings. J Am Coll Radiol 2013; 10:789-794. Aortic aneurysm NOS (ICD10-I71.9). 2. Abnormal gallbladder with stones/sludge, evidence of pericholecystic inflammation, and superimposed pneumobilia. Gas within the lumen of the gallbladder, but not identified within the gallbladder wall. The appearance is suspicious for Acute Cholecystitis, and if the patient is toxic then  consider an atypical appearance of Emphysematous Cholecystitis. Recommend Surgery consultation. 3. No other acute or inflammatory process identified in the chest, abdomen, or pelvis. 4. Possible hepatic Cirrhosis, but no stigmata of portal venous hypertension. Extensive calcified coronary artery atherosclerosis and/or stent. Diverticulosis of the large bowel. 5. Two small 3-4 mm subpleural right lung nodules are likely postinflammatory. No follow-up needed if patient is low-risk (and has no known or suspected primary neoplasm). Non-contrast chest CT can be considered in 12 months if patient is high-risk. This recommendation follows the consensus statement: Guidelines for Management of Incidental Pulmonary Nodules Detected on CT Images: From the Fleischner Society 2017; Radiology 2017; 284:228-243. Electronically Signed: By: Genevie Ann M.D. On: 05/03/2019 21:48   US Abdomen Limited Ruq  Result Date: 05/03/2019 CLINICAL DATA:  63 year old male with abnormal gallbladder on CTA Chest, Abdomen, and Pelvis today. EXAM: ULTRASOUND ABDOMEN LIMITED RIGHT UPPER QUADRANT COMPARISON:  CTA chest, Abdomen, and Pelvis today are reported separately. FINDINGS: Gallbladder: 21 millimeter shadowing echogenic gallstone redemonstrated. Additional shadowing likely due to the gallbladder air seen  by CT earlier today. Gallbladder wall thickness 6 millimeters. No pericholecystic fluid is evident. No sonographic Eulah Pont sign was elicited. Common bile duct: Diameter: 3 millimeters, normal. Liver: Nodular liver contour (image 18). Coarse hepatic echotexture. No discrete liver lesion. Background echogenicity within normal limits (image 26). Portal vein is patent on color Doppler imaging with normal direction of blood flow towards the liver. Other: Negative visible right kidney. IMPRESSION: 1. Abnormal gallbladder with stones, wall thickening, and evidence of the gas seen by CT. But no sonographic Murphy sign was elicited. 2. No bile duct  dilatation. 3. Nodular liver contour again suggestive of Cirrhosis. Electronically Signed   By: Odessa Fleming M.D.   On: 05/03/2019 23:12    Assessment and Plan:   Pre-op evaluation for possible cholecystectomy Patient presented with sharp sudden abdominal pain. Afebrile. Leukocytosis to 13.3. CT with evidence of acute cholecystitis and cirrhosis. Genereal surgery consulted. HIDA scan pending. Cardiology consulted for pre-op eval.  - Patient has multiple cardiac problems. h/o of CAD s/p Des to LAD with residual disease, ischemic cardiomyopathy, chronic combined HF, hyperlipidemia with transaminitis. His last visit with Dr. Swaziland was 11/2018 and was doing well from a cardiac standpiont. He has a follow up in February. - Patient did have some chest pain after the onset of abdominal pain. However pain was not improved with NTG or ASA. He denies chest pain on my exam. Hs troponin x 2 negative. EKG with no ischemic changes. Would not pursue further ischemic work-up at this time. - Last echo was 2106 with EF 30-35%, G1DD.  Has been taking home meds as prescribed. Appears to be well compensated. CXR negative for acute process. Denies SOB or recent weight gain/swelling. Since last echo was 2016 will recheck an echo, especially in the setting of daily alcohol use.  - Activity has been limited by back pain in the past. He is currently not active. Over the last couple days however he has been moving boxes and denies CP or SOB.  - Patient is able to do moderate work around the house. He can walk up a flight of stairs but feels breathless at the end.  - Duke Activity Status Index 7.01 METS - Revised Cardiac Risk Index>> Class III risk, 10 % chance of 30 day risk of death, MI or cardiac arrest - In the setting of acute cholecystitis it is likely OK to proceed with a procedure/surgery. Monitor fluid status closely. Patient will need to follow up with cardiology. Recommend cessation of tobacco/alcohol use. MD to  see  Chronic combined systolic and diastolic CHF - Last echo was 2016 EF 30-35% g1DD. Taking lasix at home - does not appear to be decompensated - Denies LLE/orthopnea - compliant with medications - Will repeat echo, especially with daily alcohol use. - continue BB. ACE on hold for elevated kidney function (improving with IVF) - Monitor fluid level post-procedure.   CAD s/p DES LAD 2015 H/o of NSTEMI in 2015 with DES to LAD and residual disease treated medically.  - denies chest pain - HS troponin x2 negative - EKG with no ischemic changes - continue ASA, BB. ACE on hold. H/o of statin intolerance - no further ischemic eval at this time - Recommend aggressive lifestyle changes.  Hyperlipidemia - h/o of transaminitis from statins; unable to afford Zetia or PCSK9i in the past - LDL 180 in 2017 - revisit lipid clinic referral  Alcohol/tobacoo use - recommend cessation  For questions or updates, please contact [facility] Please consult [email or web address]  for contact info under     Signed, Cadence David Stall, PA-C  05/04/2019 11:49 AM   Personally seen and examined. Agree with above.  63 year old male with chronic systolic heart failure EF 35% in 2016 post STEMI in 2015 with DES to LAD with moderate disease in RCA and circumflex here with possible cholecystitis needing possible cholecystectomy.  He is able to traverse a flight of stairs with minimal difficulty.  He does sometimes feel shortness of breath with this activity.  He does continue to smoke and does have some emphysematous changes on CT.  No high risk symptoms such as unexplained syncope, no bleeding, no anginal symptoms like chest pain.  GEN: Well nourished, well developed, in no acute distress Overweight HEENT: normal  Neck: no JVD, carotid bruits, or masses Cardiac: RRR; no murmurs, rubs, or gallops,no edema  Respiratory:  clear to auscultation bilaterally, normal work of breathing GI: soft, nontender,  nondistended, + BS MS: no deformity or atrophy  Skin: warm and dry, no rash Neuro:  Alert and Oriented x 3, Strength and sensation are intact Psych: euthymic mood, full affect  Troponin is normal EKG shows no ischemic changes, poor R wave progression noted.  Q waves noted in the anteroseptal leads.  Telemetry shows sinus rhythm.  Assessment and plan:  Preoperative cardiac risk stratification -He does have an underlying ischemic cardiomyopathy with EF back in 2016 noted to be 35%.  No active anginal symptoms.  Able to lay flat without difficulty.  Mild shortness of breath with activity such as going up stairs.  From a cardiac perspective, he may proceed with surgery, be careful with fluid administration.  Administer Lasix if hypoxia begins to occur.  Agree with above, given his underlying cardiomyopathy he is at moderate increased risk for procedure.  Continue to encourage tobacco alcohol cessation.  Continue with beta-blocker.  ACE inhibitor on hold secondary to AKI which is improving with IV fluids.  Has had difficulty in the past with transaminitis as it was thought to be related to statin use.  Consider lipid clinic referral once again as outpatient.  We will go ahead and sign off.  Please let us know if we can be of further difficulty.  Donato Schultz, MD

## 2019-05-04 NOTE — Progress Notes (Signed)
  Echocardiogram 2D Echocardiogram has been performed with [facility]inity.  Randy Beasley 05/04/2019, 5:50 PM

## 2019-05-04 NOTE — Consult Note (Signed)
[facility] Surgery Consult Note  Randy Beasley 06/20/1955  [id number].    Requesting MD: Dahal Chief Complaint/Reason for Consult: cholecystitis HPI:  Patient is a 63 year old male who presented to [facility] via EMS with abdominal pain. Pain started right after dinner yesterday evening and was located in epigastrium and RUQ, radiating to chest. Describes pain as the "worst heartburn". Patient reports similar episodes of pain over the last year but less severe and usually resolves within a few hours. Patient tried taking TUMS with no relief. Associated nausea and bilious emesis. Denies bloody or coffee-ground emesis. Pain is currently better but still present. Denies fever, chills, SOB, diarrhea, urinary symptoms. PMH significant for MI s/p stent, hx of seizures, arthritis, hypothyroidism, CHF. NKDA. Past abdominal surgery includes appendectomy. Patient takes 81 mg ASA daily. Reports daily alcohol use, on average drinks 3-4 beers/day. Smokes 3-4 cigarettes daily. Denies illicit drug use. Patient lives at home with his wife.   ROS: Review of Systems  Constitutional: Negative for chills and fever.  Respiratory: Negative for shortness of breath and wheezing.   Cardiovascular: Positive for chest pain. Negative for palpitations.  Gastrointestinal: Positive for abdominal pain, heartburn, nausea and vomiting. Negative for diarrhea.  Genitourinary: Negative for dysuria, frequency and urgency.  All other systems reviewed and are negative.   Family History  Problem Relation Age of Onset  . Hypertension Mother   . Peripheral vascular disease Mother   . Stroke Father   . Heart disease Brother     Past Medical History:  Diagnosis Date  . Abnormal LFTs   . Anxiety   . CAD (coronary artery disease)    residual disease to LCX and RCA  . Chronic systolic CHF (congestive heart failure) (HCC) 04/05/2015  . Hypothyroidism   . Ischemic cardiomyopathy    EF 25-30%  . Osteoarthritis   . Seizures  (HCC)    last was > 20 years ago  . Spinal stenosis   . STEMI (ST elevation myocardial infarction) (HCC) 04/28/14   with stent to LAD    Past Surgical History:  Procedure Laterality Date  . APPENDECTOMY    . BACK SURGERY    . CORONARY ANGIOPLASTY WITH STENT PLACEMENT  04/28/14   Promus DES to LAD  . LEFT HEART CATH N/A 04/28/2014   Procedure: LEFT HEART CATH;  Surgeon: Peter M Swaziland, MD;  Location: [facility] CATH LAB;  Service: Cardiovascular;  Laterality: N/A;  . NECK SURGERY    . TONSILLECTOMY      Social History:  reports that he quit smoking about 5 years ago. His smoking use included cigarettes. He smoked 0.00 packs per day for 42.00 years. He quit smokeless tobacco use about 37 years ago. He reports current alcohol use of about 3.0 - 5.0 standard drinks of alcohol per week. He reports that he does not use drugs.  Allergies: No Known Allergies  (Not in a hospital admission)   Blood pressure (!) 125/93, pulse 81, temperature 98.7 F (37.1 C), temperature source Oral, resp. rate 19, SpO2 91 %. Physical Exam: Physical Exam Constitutional:      General: He is not in acute distress.    Appearance: He is overweight. He is not toxic-appearing.  HENT:     Head: Normocephalic and atraumatic.     Right Ear: External ear normal.     Left Ear: External ear normal.     Nose: Nose normal.     Mouth/Throat:     Lips: Pink.  Mouth: Mucous membranes are moist.  Eyes:     General: Lids are normal. No scleral icterus.    Extraocular Movements: Extraocular movements intact.     Conjunctiva/sclera: Conjunctivae normal.  Neck:     Musculoskeletal: Normal range of motion and neck supple.  Cardiovascular:     Rate and Rhythm: Normal rate and regular rhythm.     Pulses:          Radial pulses are 2+ on the right side and 2+ on the left side.       Dorsalis pedis pulses are 2+ on the right side and 2+ on the left side.  Pulmonary:     Effort: Pulmonary effort is normal.     Breath  sounds: Normal breath sounds.  Abdominal:     General: Bowel sounds are normal. There is distension (moderate).     Tenderness: There is abdominal tenderness in the right upper quadrant and epigastric area. There is no guarding or rebound. Positive signs include Murphy's sign.  Musculoskeletal:     Right lower leg: No edema.     Left lower leg: No edema.     Comments: ROM grossly intact in bilateral upper and lower extremities  Skin:    General: Skin is warm and dry.     Coloration: Skin is not jaundiced.  Neurological:     Mental Status: He is alert and oriented to person, place, and time.  Psychiatric:        Behavior: Behavior is cooperative.     Results for orders placed or performed during the hospital encounter of 05/03/19 (from the past 48 hour(s))  Troponin I (High Sensitivity)     Status: None   Collection Time: 05/03/19  8:40 PM  Result Value Ref Range   Troponin I (High Sensitivity) 10 <18 ng/L    Comment: (NOTE) Elevated high sensitivity troponin I (hsTnI) values and significant  changes across serial measurements may suggest ACS but many other  chronic and acute conditions are known to elevate hsTnI results.  Refer to the "Links" section for chest pain algorithms and additional  guidance. Performed at [facility] Lab, [address] [place], [place] [address]   CBC     Status: Abnormal   Collection Time: 05/03/19  8:40 PM  Result Value Ref Range   WBC 11.7 (H) 4.0 - 10.5 K/uL   RBC 4.75 4.22 - 5.81 MIL/uL   Hemoglobin 16.8 13.0 - 17.0 g/dL   HCT 60.4 54.0 - 98.1 %   MCV 99.2 80.0 - 100.0 fL   MCH 35.4 (H) 26.0 - 34.0 pg   MCHC 35.7 30.0 - 36.0 g/dL   RDW 19.1 47.8 - 29.5 %   Platelets 209 150 - 400 K/uL   nRBC 0.0 0.0 - 0.2 %    Comment: Performed at [facility] Lab, [address] [place], [place] [address]  Comprehensive metabolic panel     Status: Abnormal   Collection Time: 05/03/19  8:40 PM  Result Value Ref Range   Sodium 137 135 - 145  mmol/L   Potassium 3.8 3.5 - 5.1 mmol/L   Chloride 103 98 - 111 mmol/L   CO2 20 (L) 22 - 32 mmol/L   Glucose, Bld 131 (H) 70 - 99 mg/dL   BUN 19 8 - 23 mg/dL   Creatinine, Ser 8.65 (H) 0.61 - 1.24 mg/dL   Calcium 78.4 8.9 - 69.6 mg/dL   Total Protein 7.3 6.5 - 8.1 g/dL  Albumin 4.6 3.5 - 5.0 g/dL   AST 161109 (H) 15 - 41 U/L   ALT 127 (H) 0 - 44 U/L   Alkaline Phosphatase 106 38 - 126 U/L   Total Bilirubin 2.1 (H) 0.3 - 1.2 mg/dL   GFR calc non Af Amer 36 (L) >60 mL/min   GFR calc Af Amer 42 (L) >60 mL/min   Anion gap 14 5 - 15    Comment: Performed at [facility], [address] [place], [place] [address]  Lipase     Status: None   Collection Time: 05/03/19  8:40 PM  Result Value Ref Range   Lipase 38 11 - 51 U/L    Comment: Performed at [facility], [address] [place], [place] [address]  I-stat chem 8, ED (not at [facility] or [facility])     Status: Abnormal   Collection Time: 05/03/19  8:51 PM  Result Value Ref Range   Sodium 139 135 - 145 mmol/L   Potassium 3.9 3.5 - 5.1 mmol/L   Chloride 104 98 - 111 mmol/L   BUN 20 8 - 23 mg/dL   Creatinine, Ser 1.191.80 (H) 0.61 - 1.24 mg/dL   Glucose, Bld 147125 (H) 70 - 99 mg/dL   Calcium, Ion 8.291.18 5.621.15 - 1.40 mmol/L   TCO2 21 (L) 22 - 32 mmol/L   Hemoglobin 17.0 13.0 - 17.0 g/dL   HCT 13.050.0 86.539.0 - 78.452.0 %  CBG monitoring, ED     Status: Abnormal   Collection Time: 05/03/19  9:00 PM  Result Value Ref Range   Glucose-Capillary 114 (H) 70 - 99 mg/dL  Lactic acid, plasma     Status: None   Collection Time: 05/03/19 10:02 PM  Result Value Ref Range   Lactic Acid, Venous 1.3 0.5 - 1.9 mmol/L    Comment: Performed at [facility], [address] [place], [place] [address]  SARS CORONAVIRUS 2 (TAT 6-24 HRS) Nasopharyngeal Nasopharyngeal Swab     Status: None   Collection Time: 05/03/19 10:23 PM   Specimen: Nasopharyngeal Swab  Result Value Ref Range   SARS Coronavirus 2 NEGATIVE NEGATIVE    Comment: (NOTE) SARS-CoV-2 target  nucleic acids are NOT DETECTED. The SARS-CoV-2 RNA is generally detectable in upper and lower respiratory specimens during the acute phase of infection. Negative results do not preclude SARS-CoV-2 infection, do not rule out co-infections with other pathogens, and should not be used as the sole basis for treatment or other patient management decisions. Negative results must be combined with clinical observations, patient history, and epidemiological information. The expected result is Negative. Fact Sheet for Patients: HairSlick.nohttps://www.fda.gov/media/138098/download Fact Sheet for Healthcare Providers: quierodirigir.comhttps://www.fda.gov/media/138095/download This test is not yet approved or cleared by the [place] FDA and  has been authorized for detection and/or diagnosis of SARS-CoV-2 by FDA under an Emergency Use Authorization (EUA). This EUA will remain  in effect (meaning this test can be used) for the duration of the COVID-19 declaration under Section 56 4(b)(1) of the Act, 21 U.S.C. section 360bbb-3(b)(1), unless the authorization is terminated or revoked sooner. Performed at [facility], [address] [place], [place] [address]   Troponin I (High Sensitivity)     Status: None   Collection Time: 05/03/19 10:24 PM  Result Value Ref Range   Troponin I (High Sensitivity) 9 <18 ng/L    Comment: (NOTE) Elevated high sensitivity troponin I (hsTnI) values and significant  changes across serial measurements may suggest  ACS but many other  chronic and acute conditions are known to elevate hsTnI results.  Refer to the "Links" section for chest pain algorithms and additional  guidance. Performed at [facility], [address] [place], [place] [address]   CBC     Status: Abnormal   Collection Time: 05/04/19 12:12 AM  Result Value Ref Range   WBC 11.1 (H) 4.0 - 10.5 K/uL   RBC 4.68 4.22 - 5.81 MIL/uL   Hemoglobin 16.4 13.0 - 17.0 g/dL   HCT 60.4 54.0 - 98.1 %   MCV 103.2 (H)  80.0 - 100.0 fL   MCH 35.0 (H) 26.0 - 34.0 pg   MCHC 34.0 30.0 - 36.0 g/dL   RDW 19.1 47.8 - 29.5 %   Platelets 163 150 - 400 K/uL   nRBC 0.0 0.0 - 0.2 %    Comment: Performed at [facility], [address], [place], [place] [address]  Creatinine, serum     Status: Abnormal   Collection Time: 05/04/19 12:12 AM  Result Value Ref Range   Creatinine, Ser 1.66 (H) 0.61 - 1.24 mg/dL   GFR calc non Af Amer 43 (L) >60 mL/min   GFR calc Af Amer 49 (L) >60 mL/min    Comment: Performed at [facility], [address], [place], [place] [address]  Magnesium     Status: None   Collection Time: 05/04/19 12:12 AM  Result Value Ref Range   Magnesium 2.3 1.7 - 2.4 mg/dL    Comment: Performed at [facility], [address] [place], [place] [address]  Phosphorus     Status: None   Collection Time: 05/04/19 12:12 AM  Result Value Ref Range   Phosphorus 3.8 2.5 - 4.6 mg/dL    Comment: Performed at [facility], [address] [place], [place] [address]  Lactic acid, plasma     Status: None   Collection Time: 05/04/19  1:08 AM  Result Value Ref Range   Lactic Acid, Venous 1.4 0.5 - 1.9 mmol/L    Comment: Performed at [facility], [address], [place], [place] [address]  TSH     Status: None   Collection Time: 05/04/19  3:30 AM  Result Value Ref Range   TSH 0.918 0.350 - 4.500 uIU/mL    Comment: Performed by a 3rd Generation assay with a functional sensitivity of <=0.01 uIU/mL. Performed at [facility], [address], [place], [place] [address]   Hepatic function panel     Status: Abnormal   Collection Time: 05/04/19  3:30 AM  Result Value Ref Range   Total Protein 7.3 6.5 - 8.1 g/dL   Albumin 4.2 3.5 - 5.0 g/dL   AST 90 (H) 15 - 41 U/L   ALT 118 (H) 0 - 44 U/L   Alkaline Phosphatase 107 38 - 126 U/L   Total Bilirubin 2.8 (H) 0.3 - 1.2 mg/dL   Bilirubin, Direct 0.6 (H) 0.0 - 0.2 mg/dL   Indirect Bilirubin 2.2 (H) 0.3 - 0.9 mg/dL    Comment:  Performed at [facility], [address], [place], [place] [address]  Protime-INR     Status: None   Collection Time: 05/04/19  3:30 AM  Result Value Ref Range   Prothrombin Time 13.7 11.4 - 15.2 seconds   INR 1.1 0.8 - 1.2    Comment: (NOTE) INR goal varies based on device and disease states. Performed  at [facility], [place] [address], [place], [place] [address]   Comprehensive metabolic panel     Status: Abnormal   Collection Time: 05/04/19  3:30 AM  Result Value Ref Range   Sodium 138 135 - 145 mmol/L   Potassium 4.2 3.5 - 5.1 mmol/L   Chloride 104 98 - 111 mmol/L   CO2 17 (L) 22 - 32 mmol/L   Glucose, Bld 105 (H) 70 - 99 mg/dL   BUN 20 8 - 23 mg/dL   Creatinine, Ser 1.54 (H) 0.61 - 1.24 mg/dL   Calcium 9.4 8.9 - 10.3 mg/dL   Total Protein 6.9 6.5 - 8.1 g/dL   Albumin 4.4 3.5 - 5.0 g/dL   AST 91 (H) 15 - 41 U/L   ALT 119 (H) 0 - 44 U/L   Alkaline Phosphatase 113 38 - 126 U/L   Total Bilirubin 2.8 (H) 0.3 - 1.2 mg/dL   GFR calc non Af Amer 47 (L) >60 mL/min   GFR calc Af Amer 54 (L) >60 mL/min   Anion gap 17 (H) 5 - 15    Comment: Performed at [facility], [place] [address], [place], [place] [address]  CBC     Status: Abnormal   Collection Time: 05/04/19  3:30 AM  Result Value Ref Range   WBC 13.3 (H) 4.0 - 10.5 K/uL   RBC 4.66 4.22 - 5.81 MIL/uL   Hemoglobin 16.1 13.0 - 17.0 g/dL   HCT 48.7 39.0 - 52.0 %   MCV 104.5 (H) 80.0 - 100.0 fL   MCH 34.5 (H) 26.0 - 34.0 pg   MCHC 33.1 30.0 - 36.0 g/dL   RDW 12.9 11.5 - 15.5 %   Platelets 171 150 - 400 K/uL   nRBC 0.0 0.0 - 0.2 %    Comment: Performed at [facility], [place] [address], [place], [place] [address]   Dg Chest Portable 1 View  Result Date: 05/03/2019 CLINICAL DATA:  Chest pain EXAM: PORTABLE CHEST 1 VIEW COMPARISON:  05/01/2014 FINDINGS: No focal consolidation or effusion. Borderline cardiomegaly. No pneumothorax. IMPRESSION: No active disease.  Borderline cardiomegaly Electronically Signed    By: Donavan Foil M.D.   On: 05/03/2019 20:42   Ct Angio Chest/abd/pel For Dissection W And/or Wo Contrast  Addendum Date: 05/03/2019   ADDENDUM REPORT: 05/03/2019 21:57 ADDENDUM: Study discussed by telephone with Dr. Darl Householder in the ED on 05/03/2019 at 2151 hours. Electronically Signed   By: Genevie Ann M.D.   On: 05/03/2019 21:57   Result Date: 05/03/2019 CLINICAL DATA:  63 year old male with chest pain radiating to the back and abdomen since 1800 hours. EXAM: CT ANGIOGRAPHY CHEST, ABDOMEN AND PELVIS TECHNIQUE: Multidetector CT imaging through the chest, abdomen and pelvis was performed using the standard protocol during bolus administration of intravenous contrast. Multiplanar reconstructed images and MIPs were obtained and reviewed to evaluate the vascular anatomy. CONTRAST:  14mL OMNIPAQUE IOHEXOL 350 MG/ML SOLN COMPARISON:  Portable chest earlier today. FINDINGS: CTA CHEST FINDINGS Cardiovascular: Extensive calcified coronary artery atherosclerosis and/or stent on series 5, image 32. But there is minimal calcified plaque of the thoracic aorta. Negative for thoracic aortic aneurysm or dissection. Cardiac size at the upper limits of normal. No pericardial effusion. Mediastinum/Nodes: Negative. Lungs/Pleura: Major airways are patent. Trace apical scarring and paraseptal emphysema. There are 2 small subpleural and possibly sub solid right lung nodules on series 7, images 56 and 61, 3-4 millimeters. Otherwise the lungs are clear. No pleural effusion.  Musculoskeletal: No acute osseous abnormality identified. Review of the MIP images confirms the above findings. CTA ABDOMEN AND PELVIS FINDINGS VASCULAR Aortoiliac calcified atherosclerosis. Ectatic infrarenal abdominal aorta up to 26 millimeters diameter, but no aortic aneurysm or dissection. Mild ectasia also of the bilateral iliac arteries which are patent. Visible proximal femoral arteries remain patent. The major branches of the abdominal aorta are patent, with  only mild atherosclerosis Review of the MIP images confirms the above findings. NON-VASCULAR Hepatobiliary: There is pneumobilia in the liver and in the CBD. And there is also a moderate volume of gas within the lumen of the gallbladder, non dependent on series 6, image 158. No gas identified within the wall of the gallbladder. Irregular stones, sludge, and debris also within the gallbladder lumen. And on coronal image 55 and series 6, image 160 there is evidence of some pericholecystic inflammation. The wall at the fundus appears normal where it is distended by gas. The liver contour appears nodular. No discrete liver lesion. Questionable hyperenhancement along the gallbladder fossa. Pancreas: Negative. Spleen: Diminutive, negative. Adrenals/Urinary Tract: Normal adrenal glands. Bilateral renal enhancement is symmetric and within normal limits. No hydronephrosis. Decompressed ureters. Unremarkable urinary bladder. No nephrolithiasis identified. Stomach/Bowel: Decompressed and negative rectum. Redundant sigmoid colon with extensive diverticulosis in the proximal segment but no active inflammation identified. Lesser diverticulosis throughout the decompressed descending colon with no active inflammation. Occasional diverticula also in the transverse colon. The right colon is on a lax mesentery. No large bowel inflammation identified. The cecum is in the right upper quadrant. The appendix is diminutive or absent. Negative terminal ileum. No dilated small bowel. Negative stomach. Small duodenal diverticulum on coronal image 83. No inflammation. No pneumoperitoneum. No free fluid. Lymphatic: No lymphadenopathy. Reproductive: Negative. Other: No pelvic free fluid. Musculoskeletal: No acute osseous abnormality identified. Review of the MIP images confirms the above findings. IMPRESSION: 1. Negative for aortic dissection or aneurysm, but positive for ectatic abdominal aorta at risk for aneurysm development. Recommend  followup by Ultrasound in 5 years. This recommendation follows ACR consensus guidelines: White Paper of the ACR Incidental Findings Committee II on Vascular Findings. J Am Coll Radiol 2013; 10:789-794. Aortic aneurysm NOS (ICD10-I71.9). 2. Abnormal gallbladder with stones/sludge, evidence of pericholecystic inflammation, and superimposed pneumobilia. Gas within the lumen of the gallbladder, but not identified within the gallbladder wall. The appearance is suspicious for Acute Cholecystitis, and if the patient is toxic then consider an atypical appearance of Emphysematous Cholecystitis. Recommend Surgery consultation. 3. No other acute or inflammatory process identified in the chest, abdomen, or pelvis. 4. Possible hepatic Cirrhosis, but no stigmata of portal venous hypertension. Extensive calcified coronary artery atherosclerosis and/or stent. Diverticulosis of the large bowel. 5. Two small 3-4 mm subpleural right lung nodules are likely postinflammatory. No follow-up needed if patient is low-risk (and has no known or suspected primary neoplasm). Non-contrast chest CT can be considered in 12 months if patient is high-risk. This recommendation follows the consensus statement: Guidelines for Management of Incidental Pulmonary Nodules Detected on CT Images: From the Fleischner Society 2017; Radiology 2017; 284:228-243. Electronically Signed: By: Odessa Fleming M.D. On: 05/03/2019 21:48   US Abdomen Limited Ruq  Result Date: 05/03/2019 CLINICAL DATA:  63 year old male with abnormal gallbladder on CTA Chest, Abdomen, and Pelvis today. EXAM: ULTRASOUND ABDOMEN LIMITED RIGHT UPPER QUADRANT COMPARISON:  CTA chest, Abdomen, and Pelvis today are reported separately. FINDINGS: Gallbladder: 21 millimeter shadowing echogenic gallstone redemonstrated. Additional shadowing likely due to the gallbladder air seen by CT earlier today.  Gallbladder wall thickness 6 millimeters. No pericholecystic fluid is evident. No sonographic Eulah Pont  sign was elicited. Common bile duct: Diameter: 3 millimeters, normal. Liver: Nodular liver contour (image 18). Coarse hepatic echotexture. No discrete liver lesion. Background echogenicity within normal limits (image 26). Portal vein is patent on color Doppler imaging with normal direction of blood flow towards the liver. Other: Negative visible right kidney. IMPRESSION: 1. Abnormal gallbladder with stones, wall thickening, and evidence of the gas seen by CT. But no sonographic Murphy sign was elicited. 2. No bile duct dilatation. 3. Nodular liver contour again suggestive of Cirrhosis. Electronically Signed   By: Odessa Fleming M.D.   On: 05/03/2019 23:12      Assessment/Plan Hx of STEMI s/p stent to LAD Hx of seizures - has been off antiepileptics for several years with no seizures OA Chronic systolic CHF Hypothyroidism Alcohol abuse with possible cirrhosis - drinks 3-4 beers daily on average Tobacco abuse - smokes 3-4 cigarettes daily  RUQ pain Elevated LFTs Probable cholecystitis - CT and [place] suggestive of cholecystitis but also suggest possible cirrhosis - WBC 13, LFTs mildly elevated, Tbili 2.8 - recommend HIDA scan this AM - needs cards clearance - if high risk and HIDA is positive, may require percutaneous drainage - keep NPO for now, IV abx   Wells Guiles, [facility] Surgery 05/04/2019, 8:19 AM Please see Amion for pager number during day hours 7:00am-4:30pm

## 2019-05-04 NOTE — ED Notes (Signed)
Report given to Judson Roch, RN on 5W.  Pt is in nuclear med for testing and will transport to 5W when he gets back.

## 2019-05-04 NOTE — ED Notes (Signed)
Randy Beasley, spouse would like to be called by MD for update. Cell phone number in chart.

## 2019-05-04 NOTE — Progress Notes (Signed)
Patient ID: Randy Beasley, male   DOB: 1955/07/24, 63 y.o.   MRN: [id number]   Pt seen by GI and Cards.  HIDA pending. Plan lap chole with possible cholangiogram tomorrow He agrees to proceed

## 2019-05-05 ENCOUNTER — Inpatient Hospital Stay (HOSPITAL_COMMUNITY): Payer: Medicare HMO | Admitting: Certified Registered"

## 2019-05-05 ENCOUNTER — Encounter (HOSPITAL_COMMUNITY): Payer: Self-pay

## 2019-05-05 ENCOUNTER — Encounter (HOSPITAL_COMMUNITY)
Admission: EM | Disposition: A | Discharge status: Home / Self Care | Payer: Self-pay | Source: Home / Self Care | Attending: Internal Medicine

## 2019-05-05 HISTORY — PX: CHOLECYSTECTOMY: SHX55

## 2019-05-05 LAB — COMPREHENSIVE METABOLIC PANEL
ALT: 78 U/L — ABNORMAL HIGH (ref 0–44)
AST: 46 U/L — ABNORMAL HIGH (ref 15–41)
Albumin: 4 g/dL (ref 3.5–5.0)
Alkaline Phosphatase: 90 U/L (ref 38–126)
Anion gap: 11 (ref 5–15)
BUN: 24 mg/dL — ABNORMAL HIGH (ref 8–23)
CO2: 21 mmol/L — ABNORMAL LOW (ref 22–32)
Calcium: 8.7 mg/dL — ABNORMAL LOW (ref 8.9–10.3)
Chloride: 101 mmol/L (ref 98–111)
Creatinine, Ser: 1.75 mg/dL — ABNORMAL HIGH (ref 0.61–1.24)
GFR calc Af Amer: 46 mL/min — ABNORMAL LOW (ref >60)
GFR calc non Af Amer: 40 mL/min — ABNORMAL LOW (ref >60)
Glucose, Bld: 104 mg/dL — ABNORMAL HIGH (ref 70–99)
Potassium: 4.3 mmol/L (ref 3.5–5.1)
Sodium: 133 mmol/L — ABNORMAL LOW (ref 135–145)
Total Bilirubin: 4 mg/dL — ABNORMAL HIGH (ref 0.3–1.2)
Total Protein: 6.8 g/dL (ref 6.5–8.1)

## 2019-05-05 LAB — LIPID PANEL
Cholesterol: 183 mg/dL (ref 0–200)
HDL: 40 mg/dL — ABNORMAL LOW (ref >40)
LDL Cholesterol: 123 mg/dL — ABNORMAL HIGH (ref 0–99)
Total CHOL/HDL Ratio: 4.6 RATIO
Triglycerides: 98 mg/dL (ref <150)
VLDL: 20 mg/dL (ref 0–40)

## 2019-05-05 LAB — CBC
HCT: 45.2 % (ref 39.0–52.0)
Hemoglobin: 15.3 g/dL (ref 13.0–17.0)
MCH: 34.9 pg — ABNORMAL HIGH (ref 26.0–34.0)
MCHC: 33.8 g/dL (ref 30.0–36.0)
MCV: 103 fL — ABNORMAL HIGH (ref 80.0–100.0)
Platelets: 141 10*3/uL — ABNORMAL LOW (ref 150–400)
RBC: 4.39 MIL/uL (ref 4.22–5.81)
RDW: 12.5 % (ref 11.5–15.5)
WBC: 19.3 10*3/uL — ABNORMAL HIGH (ref 4.0–10.5)
nRBC: 0 % (ref 0.0–0.2)

## 2019-05-05 SURGERY — LAPAROSCOPIC CHOLECYSTECTOMY WITH INTRAOPERATIVE CHOLANGIOGRAM
Anesthesia: General | Site: Abdomen

## 2019-05-05 MED ORDER — MIDAZOLAM HCL 5 MG/5ML IJ SOLN
INTRAMUSCULAR | Status: DC | PRN
  Administered 2019-05-05: 2 mg via INTRAVENOUS

## 2019-05-05 MED ORDER — PROPOFOL 10 MG/ML IV BOLUS
INTRAVENOUS | Status: AC
  Filled 2019-05-05: qty 20

## 2019-05-05 MED ORDER — ROCURONIUM BROMIDE 10 MG/ML (PF) SYRINGE
PREFILLED_SYRINGE | INTRAVENOUS | Status: AC
  Filled 2019-05-05: qty 10

## 2019-05-05 MED ORDER — PHENYLEPHRINE 40 MCG/ML (10ML) SYRINGE FOR IV PUSH (FOR BLOOD PRESSURE SUPPORT)
PREFILLED_SYRINGE | INTRAVENOUS | Status: DC | PRN
  Administered 2019-05-05: 80 ug via INTRAVENOUS

## 2019-05-05 MED ORDER — PROPOFOL 10 MG/ML IV BOLUS
INTRAVENOUS | Status: DC | PRN
  Administered 2019-05-05: 160 mg via INTRAVENOUS

## 2019-05-05 MED ORDER — 0.9 % SODIUM CHLORIDE (POUR BTL) OPTIME
TOPICAL | Status: DC | PRN
  Administered 2019-05-05: 1000 mL

## 2019-05-05 MED ORDER — ONDANSETRON HCL 4 MG/2ML IJ SOLN: 4.0000 mg | Freq: Once | INTRAMUSCULAR | Status: DC | PRN

## 2019-05-05 MED ORDER — FENTANYL CITRATE (PF) 250 MCG/5ML IJ SOLN
INTRAMUSCULAR | Status: AC
  Filled 2019-05-05: qty 5

## 2019-05-05 MED ORDER — DEXAMETHASONE SODIUM PHOSPHATE 10 MG/ML IJ SOLN
INTRAMUSCULAR | Status: AC
  Filled 2019-05-05: qty 1

## 2019-05-05 MED ORDER — OXYCODONE HCL 5 MG/5ML PO SOLN: 5.0000 mg | Freq: Once | ORAL | Status: DC | PRN

## 2019-05-05 MED ORDER — ROCURONIUM BROMIDE 50 MG/5ML IV SOSY
PREFILLED_SYRINGE | INTRAVENOUS | Status: DC | PRN
  Administered 2019-05-05: 70 mg via INTRAVENOUS

## 2019-05-05 MED ORDER — MIDAZOLAM HCL 2 MG/2ML IJ SOLN
INTRAMUSCULAR | Status: AC
  Filled 2019-05-05: qty 2

## 2019-05-05 MED ORDER — HEMOSTATIC AGENTS (NO CHARGE) OPTIME
TOPICAL | Status: DC | PRN
  Administered 2019-05-05 (×2): 1 via TOPICAL

## 2019-05-05 MED ORDER — OXYCODONE HCL 5 MG PO TABS: 5.0000 mg | ORAL_TABLET | Freq: Once | ORAL | Status: DC | PRN

## 2019-05-05 MED ORDER — HYDROMORPHONE HCL 1 MG/ML IJ SOLN
INTRAMUSCULAR | Status: AC
  Filled 2019-05-05: qty 1

## 2019-05-05 MED ORDER — SUGAMMADEX SODIUM 200 MG/2ML IV SOLN
INTRAVENOUS | Status: DC | PRN
  Administered 2019-05-05: 400 mg via INTRAVENOUS

## 2019-05-05 MED ORDER — ONDANSETRON HCL 4 MG/2ML IJ SOLN
INTRAMUSCULAR | Status: AC
  Filled 2019-05-05: qty 2

## 2019-05-05 MED ORDER — FENTANYL CITRATE (PF) 100 MCG/2ML IJ SOLN
INTRAMUSCULAR | Status: DC | PRN
  Administered 2019-05-05: 100 ug via INTRAVENOUS
  Administered 2019-05-05: 50 ug via INTRAVENOUS
  Administered 2019-05-05: 100 ug via INTRAVENOUS

## 2019-05-05 MED ORDER — FENTANYL CITRATE (PF) 100 MCG/2ML IJ SOLN
INTRAMUSCULAR | Status: AC
  Filled 2019-05-05: qty 2

## 2019-05-05 MED ORDER — LIDOCAINE 2% (20 MG/ML) 5 ML SYRINGE
INTRAMUSCULAR | Status: AC
  Filled 2019-05-05: qty 5

## 2019-05-05 MED ORDER — OXYCODONE HCL 5 MG PO TABS
5.0000 mg | ORAL_TABLET | ORAL | Status: DC | PRN
  Administered 2019-05-05 – 2019-05-14 (×26): 10 mg via ORAL
  Filled 2019-05-05 (×26): qty 2

## 2019-05-05 MED ORDER — FENTANYL CITRATE (PF) 100 MCG/2ML IJ SOLN
25.0000 ug | INTRAMUSCULAR | Status: DC | PRN
  Administered 2019-05-05 (×2): 50 ug via INTRAVENOUS

## 2019-05-05 MED ORDER — BUPIVACAINE-EPINEPHRINE (PF) 0.5% -1:200000 IJ SOLN
INTRAMUSCULAR | Status: DC | PRN
  Administered 2019-05-05: 20 mL

## 2019-05-05 MED ORDER — ONDANSETRON HCL 4 MG/2ML IJ SOLN
INTRAMUSCULAR | Status: DC | PRN
  Administered 2019-05-05: 4 mg via INTRAVENOUS

## 2019-05-05 MED ORDER — PHENYLEPHRINE 40 MCG/ML (10ML) SYRINGE FOR IV PUSH (FOR BLOOD PRESSURE SUPPORT)
PREFILLED_SYRINGE | INTRAVENOUS | Status: AC
  Filled 2019-05-05: qty 10

## 2019-05-05 MED ORDER — BUPIVACAINE-EPINEPHRINE 0.5% -1:200000 IJ SOLN
INTRAMUSCULAR | Status: AC
  Filled 2019-05-05: qty 1

## 2019-05-05 MED ORDER — LIDOCAINE 2% (20 MG/ML) 5 ML SYRINGE
INTRAMUSCULAR | Status: DC | PRN
  Administered 2019-05-05: 60 mg via INTRAVENOUS

## 2019-05-05 MED ORDER — SODIUM CHLORIDE 0.9 % IR SOLN
Status: DC | PRN
  Administered 2019-05-05: 1000 mL

## 2019-05-05 MED ORDER — DEXMEDETOMIDINE HCL 200 MCG/2ML IV SOLN
INTRAVENOUS | Status: DC | PRN
  Administered 2019-05-05: 16 ug via INTRAVENOUS

## 2019-05-05 MED ORDER — DEXAMETHASONE SODIUM PHOSPHATE 10 MG/ML IJ SOLN
INTRAMUSCULAR | Status: DC | PRN
  Administered 2019-05-05: 8 mg via INTRAVENOUS

## 2019-05-05 MED ORDER — HYDROMORPHONE HCL 1 MG/ML IJ SOLN
1.0000 mg | INTRAMUSCULAR | Status: DC | PRN
  Administered 2019-05-05 – 2019-05-12 (×9): 1 mg via INTRAVENOUS
  Filled 2019-05-05 (×9): qty 1

## 2019-05-05 MED ORDER — HYDROMORPHONE HCL 1 MG/ML IJ SOLN
0.2500 mg | INTRAMUSCULAR | Status: DC | PRN
  Administered 2019-05-05 (×2): 0.5 mg via INTRAVENOUS

## 2019-05-05 MED ORDER — LACTATED RINGERS IV SOLN
INTRAVENOUS | Status: DC
  Administered 2019-05-05 – 2019-05-11 (×7): via INTRAVENOUS

## 2019-05-05 MED FILL — Perflutren Lipid Microsphere IV Susp 1.1 MG/ML: INTRAVENOUS | Qty: 10 | Status: AC

## 2019-05-05 SURGICAL SUPPLY — 42 items
APPLIER CLIP 5 13 M/L LIGAMAX5 (MISCELLANEOUS) ×3
BIOPATCH RED 1 DISK 7.0 (GAUZE/BANDAGES/DRESSINGS) ×2 IMPLANT
BIOPATCH RED 1IN DISK 7.0MM (GAUZE/BANDAGES/DRESSINGS) ×1
BLADE CLIPPER SURG (BLADE) ×3 IMPLANT
CANISTER SUCT 3000ML PPV (MISCELLANEOUS) ×3 IMPLANT
CHLORAPREP W/TINT 26 (MISCELLANEOUS) ×3 IMPLANT
CLIP APPLIE 5 13 M/L LIGAMAX5 (MISCELLANEOUS) ×1 IMPLANT
COVER MAYO STAND STRL (DRAPES) ×3 IMPLANT
COVER SURGICAL LIGHT HANDLE (MISCELLANEOUS) ×3 IMPLANT
COVER WAND RF STERILE (DRAPES) IMPLANT
DERMABOND ADVANCED (GAUZE/BANDAGES/DRESSINGS) ×2
DERMABOND ADVANCED .7 DNX12 (GAUZE/BANDAGES/DRESSINGS) ×1 IMPLANT
DRAIN CHANNEL 19F RND (DRAIN) ×3 IMPLANT
DRAPE C-ARM 42X120 X-RAY (DRAPES) ×3 IMPLANT
DRSG TEGADERM 2-3/8X2-3/4 SM (GAUZE/BANDAGES/DRESSINGS) ×3 IMPLANT
ELECT REM PT RETURN 9FT ADLT (ELECTROSURGICAL) ×3
ELECTRODE REM PT RTRN 9FT ADLT (ELECTROSURGICAL) ×1 IMPLANT
EVACUATOR SILICONE 100CC (DRAIN) ×3 IMPLANT
GLOVE SURG SIGNA 7.5 PF LTX (GLOVE) ×3 IMPLANT
GOWN STRL REUS W/ TWL LRG LVL3 (GOWN DISPOSABLE) ×2 IMPLANT
GOWN STRL REUS W/ TWL XL LVL3 (GOWN DISPOSABLE) ×1 IMPLANT
GOWN STRL REUS W/TWL LRG LVL3 (GOWN DISPOSABLE) ×4
GOWN STRL REUS W/TWL XL LVL3 (GOWN DISPOSABLE) ×2
KIT BASIN OR (CUSTOM PROCEDURE TRAY) ×3 IMPLANT
KIT TURNOVER KIT B (KITS) ×3 IMPLANT
NS IRRIG 1000ML POUR BTL (IV SOLUTION) ×3 IMPLANT
PAD ARMBOARD 7.5X6 YLW CONV (MISCELLANEOUS) ×3 IMPLANT
POUCH SPECIMEN RETRIEVAL 10MM (ENDOMECHANICALS) ×3 IMPLANT
SCISSORS LAP 5X35 DISP (ENDOMECHANICALS) ×3 IMPLANT
SET CHOLANGIOGRAPH 5 50 .035 (SET/KITS/TRAYS/PACK) ×3 IMPLANT
SET IRRIG TUBING LAPAROSCOPIC (IRRIGATION / IRRIGATOR) ×3 IMPLANT
SET TUBE SMOKE EVAC HIGH FLOW (TUBING) ×3 IMPLANT
SLEEVE ENDOPATH XCEL 5M (ENDOMECHANICALS) ×6 IMPLANT
SPECIMEN JAR SMALL (MISCELLANEOUS) ×3 IMPLANT
SUT MNCRL AB 4-0 PS2 18 (SUTURE) ×3 IMPLANT
SUT VICRYL 0 UR6 27IN ABS (SUTURE) ×3 IMPLANT
TOWEL GREEN STERILE (TOWEL DISPOSABLE) IMPLANT
TOWEL GREEN STERILE FF (TOWEL DISPOSABLE) ×3 IMPLANT
TRAY LAPAROSCOPIC MC (CUSTOM PROCEDURE TRAY) ×3 IMPLANT
TROCAR XCEL BLUNT TIP 100MML (ENDOMECHANICALS) ×3 IMPLANT
TROCAR XCEL NON-BLD 5MMX100MML (ENDOMECHANICALS) ×3 IMPLANT
WATER STERILE IRR 1000ML POUR (IV SOLUTION) ×3 IMPLANT

## 2019-05-05 NOTE — Anesthesia Postprocedure Evaluation (Signed)
Anesthesia Post Note  Patient: Randy Beasley  Procedure(s) Performed: LAPAROSCOPIC CHOLECYSTECTOMY (N/A Abdomen)     Patient location during evaluation: PACU Anesthesia Type: General Level of consciousness: awake and alert Pain management: pain level controlled Vital Signs Assessment: post-procedure vital signs reviewed and stable Respiratory status: spontaneous breathing, nonlabored ventilation, respiratory function stable and patient connected to nasal cannula oxygen Cardiovascular status: blood pressure returned to baseline and stable Postop Assessment: no apparent nausea or vomiting Anesthetic complications: no    Last Vitals:  Vitals:   05/05/19 1535 05/05/19 1547  BP: 123/77 114/78  Pulse: 84 85  Resp: 20 20  Temp:  37.4 C  SpO2: 90% 94%    Last Pain:  Vitals:   05/05/19 1542  TempSrc:   PainSc: [place]

## 2019-05-05 NOTE — Transfer of Care (Signed)
Immediate Anesthesia Transfer of Care Note  Patient: Randy Beasley  Procedure(s) Performed: LAPAROSCOPIC CHOLECYSTECTOMY (N/A Abdomen)  Patient Location: PACU  Anesthesia Type:General  Level of Consciousness: drowsy and patient cooperative  Airway & Oxygen Therapy: Patient Spontanous Breathing and Patient connected to face mask oxygen  Post-op Assessment: Report given to RN and Post -op Vital signs reviewed and stable  Post vital signs: Reviewed and stable  Last Vitals:  Vitals Value Taken Time  BP 138/90 05/05/19 1449  Temp    Pulse 90 05/05/19 1450  Resp 31 05/05/19 1450  SpO2 92 % 05/05/19 1450  Vitals shown include unvalidated device data.  Last Pain:  Vitals:   05/05/19 1136  TempSrc: Oral  PainSc:          Complications: No apparent anesthesia complications

## 2019-05-05 NOTE — Progress Notes (Signed)
PROGRESS NOTE  Randy Beasley  DOB: 1955/06/18  PCP: Ivan Anchors, MD OVZ:[id number]  DOA: 05/03/2019  LOS: 2 days   Chief Complaint  Patient presents with  . Chest Pain   Brief narrative: Randy Beasley is a 63 y.o. male with PMH of HTN, CAD, MI s/p stent, CHF, ischemic cardiomyopathy EF 25 to 30%, hypothyroidism, seizures, hepatic steatosis. Patient presented to the ED on 05/03/2019 with complaint of abdominal pain radiating to the chest started after dinner, seated with nausea and bilious emesis.  In the ED, patient was afebrile and hemodynamically stable.  He had tenderness in the right upper quadrant. Work-up showed WBC 11.7, potassium 3.8, BUN/creatinine 19/1.91, AST/ALT/bilirubin elevated to 109/127/2.1  CT angio of the chest and abdomen revealed: 1. Findings suspicious of acute cholecystitis which include abnormal gallbladder with stones/sludge, evidence of pericholecystic inflammation, and superimposed pneumobilia. 2. Possible hepatic Cirrhosis, but no stigmata of portal venous hypertension. Extensive calcified coronary artery atherosclerosis and/or stent.   Patient was admitted under hospitalist medicine service for acute cholecystitis.  IV Zosyn was started.  General surgery as well as cardiology consultation was obtained.  Subjective: Patient was seen and examined this morning.  Pleasant elderly Caucasian male.  Feels much better today with pain medicine compared to how he looked yesterday. T-max 101.7 last 24 hours, blood pressure normal Labs from this morning showed creatinine up to 1.75 AST ALT level coming down, total bilirubin level slightly up WBC count elevated to 19.3 today Acute hepatitis panel negative.  HIV negative. HIDA scan yesterday was positive for acute cholecystitis. Echocardiogram 11/18 showed EF of 40 to 45% which is an improvement compared to previous.  Assessment/Plan: Acute cholecystitis  -Presented with abdominal pain, nausea,  vomiting.  Positive HIDA scan - currently on IV Zosyn.  Pain controlled.  -General surgery consultation appreciated.  Noted to plan for lap chole today. -Cardiology consultation as well as GI consultation appreciated.  Possible liver cirrhosis -Presented with elevated liver enzymes and bilirubin  -Nodular contour of liver seen in imaging.  No evidence of portal hypertension at this time. -Likely related to hepatic steatosis as well as alcohol use. -Liver enzymes trending down, bilirubin slightly up today. -Patient will follow-up with GI as an outpatient.  Acute kidney injury on CKD stage 3a -Baseline creatinine 1.3, CKD stage 3a -Presented with creatinine elevated to 1.91, creatinine level fluctuating.  1.75 this morning, which is slightly up from 1.54 yesterday.  History of systolic congestive heart failure -Echocardiogram 11/18 showed EF of 40 to 45% which is an improvement compared to previous. -Does not seem decompensated at this time.   -Home meds include Lasix, metoprolol, ramipril  Coronary artery disease/history of NSTEMI status post PCI: No chest pain. Cardiac enzymes negative. On aspirin 81 mg daily at home.  Hypothyroidism -continue Synthroid   Depression - continue BuSpar.    Mobility: Encourage ambulation DVT prophylaxis:  Subcu Lovenox Code Status:   Code Status: Full Code  Family Communication:  Not at bedside Expected Discharge:  Anticipate 2-3 more days of hospital stay.  Consultants:  Cardiology, general surgery  Procedures:  None  Antimicrobials: Anti-infectives (From admission, onward)   Start     Dose/Rate Route Frequency Ordered Stop   05/04/19 1530  [MAR Hold]  piperacillin-tazobactam (ZOSYN) IVPB 3.375 g     (MAR Hold since Thu 05/05/2019 at 1240.Hold Reason: Transfer to a Procedural area.)   3.375 g 12.5 mL/hr over 240 Minutes Intravenous Every 8 hours 05/04/19 1523  05/04/19 0600  piperacillin-tazobactam (ZOSYN) IVPB 3.375 g  Status:   Discontinued     3.375 g 100 mL/hr over 30 Minutes Intravenous Every 8 hours 05/04/19 0228 05/04/19 1522   05/03/19 2200  piperacillin-tazobactam (ZOSYN) IVPB 3.375 g     3.375 g 100 mL/hr over 30 Minutes Intravenous  Once 05/03/19 2152 05/04/19 0011      Diet Order            Diet NPO time specified  Diet effective midnight              Infusions:  . lactated ringers 10 mL/hr at 05/05/19 1250  . [MAR Hold] piperacillin-tazobactam (ZOSYN)  IV 3.375 g (05/05/19 1287)    Scheduled Meds: . [MAR Hold] aspirin  81 mg Oral Daily  . [MAR Hold] cholecalciferol  1,000 Units Oral Daily  . [MAR Hold] enoxaparin (LOVENOX) injection  40 mg Subcutaneous Q24H  . fentaNYL      . [MAR Hold] furosemide  20 mg Oral Daily  . HYDROmorphone      . [MAR Hold] levothyroxine  125 mcg Oral Q0600  . [MAR Hold] metoprolol tartrate  12.5 mg Oral BID  . pneumococcal 23 valent vaccine  0.5 mL Intramuscular Tomorrow-1000    PRN meds: fentaNYL (SUBLIMAZE) injection, HYDROmorphone (DILAUDID) injection, [MAR Hold]  HYDROmorphone (DILAUDID) injection, ondansetron (ZOFRAN) IV, oxyCODONE **OR** oxyCODONE   Objective: Vitals:   05/05/19 1505 05/05/19 1520  BP: 131/82 114/77  Pulse: 85 84  Resp: 13 16  Temp:    SpO2: 90% 91%    Intake/Output Summary (Last 24 hours) at 05/05/2019 1533 Last data filed at 05/05/2019 1450 Gross per 24 hour  Intake 869.71 ml  Output -  Net 869.71 ml   Filed Weights   05/04/19 1530 05/04/19 1548 05/05/19 0311  Weight: 109.6 kg 108.6 kg 109.4 kg   Weight change:  Body mass index is 33.64 kg/m.   Physical Exam:  General exam: Appears calm and comfortable.  Pain controlled Skin: No rashes, lesions or ulcers. HEENT: Atraumatic, normocephalic, supple neck, no obvious bleeding Lungs: Clear to auscultation bilaterally CVS: Regular rate and rhythm, no murmur GI/Abd soft, mild tenderness present in right upper quadrant, bowel sound present CNS: Alert, awake, oriented  x3 Psychiatry: Mood appropriate  Extremities: No pedal edema, no calf tenderness  Data Review: I have personally reviewed the laboratory data and studies available.  Recent Labs  Lab 05/03/19 2040 05/03/19 2051 05/04/19 0012 05/04/19 0330 05/05/19 0325  WBC 11.7*  --  11.1* 13.3* 19.3*  HGB 16.8 17.0 16.4 16.1 15.3  HCT 47.1 50.0 48.3 48.7 45.2  MCV 99.2  --  103.2* 104.5* 103.0*  PLT 209  --  163 171 141*   Recent Labs  Lab 05/03/19 2040 05/03/19 2051 05/04/19 0012 05/04/19 0330 05/05/19 0325  NA 137 139  --  138 133*  K 3.8 3.9  --  4.2 4.3  CL 103 104  --  104 101  CO2 20*  --   --  17* 21*  GLUCOSE 131* 125*  --  105* 104*  BUN 19 20  --  20 24*  CREATININE 1.91* 1.80* 1.66* 1.54* 1.75*  CALCIUM 10.0  --   --  9.4 8.7*  MG  --   --  2.3  --   --   PHOS  --   --  3.8  --   --     Lorin Glass, MD  [facility] 05/05/2019

## 2019-05-05 NOTE — Plan of Care (Signed)

## 2019-05-05 NOTE — Op Note (Signed)
LAPAROSCOPIC CHOLECYSTECTOMY  Procedure Note  Randy Beasley 05/03/2019 - 05/05/2019   Pre-op Diagnosis: ACUTE CHOLECYSTITIS WITH CHOLELITHIASIS     Post-op Diagnosis: SAME  Procedure(s): LAPAROSCOPIC CHOLECYSTECTOMY  Surgeon(s): Coralie Keens, MD  Anesthesia: General  Staff:  Circulator: Cecilio Asper, RN Physician Assistant: Vivia Ewing, PA-C Scrub Person: Cyd Silence, RN; Zachery Conch, Jahmesha S  Estimated Blood Loss: Minimal               Specimens: SENT TO PATH  Indications: This is a 63 year old gentleman who presented with right upper quadrant abdominal pain.  He was found on CT and ultrasound to have findings worrisome for cholecystitis with also a new finding of cirrhosis.  He underwent a HIDA scan showing nonfilling of the gallbladder consistent with acute cholecystitis.  Preoperative hepatitis panel was normal.  He did have an elevated bilirubin.  The decision was made to proceed to the operating room for a cholecystectomy and possible cholangiogram  Findings: The patient was found to have a cirrhotic liver.  The gallbladder was acutely inflamed with necrosis.  The cystic duct was necrotic as well cholangiograms not performed.  Procedure: The patient was brought to the operating identifies correct patient.  He is placed upon the operating table and general anesthesia was induced.  His abdomen was prepped and draped in usual sterile fashion.  I made a vertical incision above the umbilicus with a scalpel.  I carried this down to the fascia which was then open with a scalpel as well.  Hemostat was then used to pass into the peritoneal cavity under direct vision.  A 5 mm trocar was placed the patient epigastrium and 2 more in the right upper quadrant all under direct vision.  The patient had a cirrhotic liver.  He had acutely distended and inflamed gallbladder with areas of necrosis.  I needle aspirated bile from the gallbladder.  I was then  able to grasp it and tract above the liver bed.  The cystic artery was anterior.  It was clipped proximally distally and transected.  The base of the gallbladder was really much more necrotic including the cystic duct.  I achieved a critical window around the cystic duct.  I then clipped proximally and distally and transected it.  I did not attempt a cholangiogram given the necrotic nature of the cystic duct.  At this point I then slowly dissected free the gallbladder from the liver bed.  Once it was free from liver bed I placed in an Endosac removed through the incision at the umbilicus.  The sac broke at the level of the skin spilling stones into the subcutaneous tissue.  Hemostasis appeared to be achieved in the liver bed.  I did place 2 pieces of surgical snow into the liver bed given his cirrhosis.  I then used the lateralmost trocar site to place a 19 [place] Blake drain into the gallbladder fossa.  This was sutured in place with nylon suture.  The abdomen was irrigated with saline.  Again hemostasis appeared to be achieved.  All ports removed under direct vision the abdomen was deflated.  I removed all stones that I could palpate and see in the subcutaneous tissue at the umbilicus.  I then remove the previous suture and placed a new figure-of-eight 0 Vicryl suture at the umbilicus.  We irrigated the umbilicus with saline and also cleaned with Betadine.  All incisions were anesthetized with Marcaine and closed with 4-0 Monocryl sutures.  Dermabond was then  applied.  The patient tolerated the procedure well.  All the counts were correct at the end the procedure.  Patient was then extubated in the operating room and taken in stable addition to the recovery room.          Randy Beasley   Date: 05/05/2019  Time: 2:29 PM

## 2019-05-05 NOTE — Anesthesia Procedure Notes (Signed)
Procedure Name: Intubation Date/Time: 05/05/2019 1:47 PM Performed by: Orlie Dakin, CRNA Pre-anesthesia Checklist: Patient identified, Emergency Drugs available, Suction available and Patient being monitored Patient Re-evaluated:Patient Re-evaluated prior to induction Oxygen Delivery Method: Circle system utilized Preoxygenation: Pre-oxygenation with 100% oxygen Induction Type: IV induction Ventilation: Mask ventilation without difficulty and Oral airway inserted - appropriate to patient size Laryngoscope Size: Sabra Heck and 3 Grade View: Grade II Tube type: Oral Tube size: 7.5 mm Number of attempts: 1 Airway Equipment and Method: Stylet Placement Confirmation: ETT inserted through vocal cords under direct vision,  positive ETCO2 and breath sounds checked- equal and bilateral Secured at: 23 cm Tube secured with: Tape Dental Injury: Teeth and Oropharynx as per pre-operative assessment  Comments: 4x4s bite block used.

## 2019-05-05 NOTE — Anesthesia Preprocedure Evaluation (Addendum)
Anesthesia Evaluation  Patient identified by MRN, date of birth, ID band Patient awake    Reviewed: Allergy & Precautions, NPO status , Patient's Chart, lab work & pertinent test results  History of Anesthesia Complications Negative for: history of anesthetic complications  Airway Mallampati: II  TM Distance: >3 FB Neck ROM: Full    Dental  (+) Dental Advisory Given, Chipped   Pulmonary former smoker,    Pulmonary exam normal        Cardiovascular + CAD, + Past MI, + Cardiac Stents and +CHF  Normal cardiovascular exam   '20 TTE - EF 40 to 45%. There is moderately increased left ventricular hypertrophy. The mid to apical anteroseptal wall and the true apex appeared akinetic. Grade I diastolic dysfunction (impaired relaxation). Mild aortic valve sclerosis without stenosis.    Neuro/Psych Seizures -, Well Controlled,  PSYCHIATRIC DISORDERS Anxiety    GI/Hepatic negative GI ROS,  Elevated LFTs    Endo/Other  Hypothyroidism  Obesity   Renal/GU Renal InsufficiencyRenal disease     Musculoskeletal  (+) Arthritis ,   Abdominal   Peds  Hematology  Thrombocytopenia (Plt 141k)    Anesthesia Other Findings   Reproductive/Obstetrics                            Anesthesia Physical Anesthesia Plan  ASA: III  Anesthesia Plan: General   Post-op Pain Management:    Induction: Intravenous  PONV Risk Score and Plan: 4 or greater and Treatment may vary due to age or medical condition, Ondansetron, Midazolam and Dexamethasone  Airway Management Planned: Oral ETT  Additional Equipment: None  Intra-op Plan:   Post-operative Plan: Extubation in OR  Informed Consent: I have reviewed the patients History and Physical, chart, labs and discussed the procedure including the risks, benefits and alternatives for the proposed anesthesia with the patient or authorized representative who has indicated  his/her understanding and acceptance.     Dental advisory given  Plan Discussed with: CRNA and Anesthesiologist  Anesthesia Plan Comments:        Anesthesia Quick Evaluation

## 2019-05-05 NOTE — Progress Notes (Signed)
Patient ID: SILVER PARKEY, male   DOB: [birth year], 63 y.o.   MRN: [id number]   Pre Procedure note for inpatients:   CHAYIM BIALAS has been scheduled for Procedure(s): LAPAROSCOPIC CHOLECYSTECTOMY WITH INTRAOPERATIVE CHOLANGIOGRAM (N/A) today. The various methods of treatment have been discussed with the patient. After consideration of the risks, benefits and treatment options the patient has consented to the planned procedure.   The patient has been seen and labs reviewed. There are no changes in the patient's condition to prevent proceeding with the planned procedure today.  Recent labs:  Lab Results  Component Value Date   WBC 19.3 (H) 05/05/2019   HGB 15.3 05/05/2019   HCT 45.2 05/05/2019   PLT 141 (L) 05/05/2019   GLUCOSE 104 (H) 05/05/2019   CHOL 183 05/05/2019   TRIG 98 05/05/2019   HDL 40 (L) 05/05/2019   LDLCALC 123 (H) 05/05/2019   ALT 78 (H) 05/05/2019   AST 46 (H) 05/05/2019   NA 133 (L) 05/05/2019   K 4.3 05/05/2019   CL 101 05/05/2019   CREATININE 1.75 (H) 05/05/2019   BUN 24 (H) 05/05/2019   CO2 21 (L) 05/05/2019   TSH 0.918 05/04/2019   INR 1.1 05/04/2019   HGBA1C 5.7 (H) 02/15/2012    Coralie Keens, MD 05/05/2019 1:07 PM

## 2019-05-06 ENCOUNTER — Encounter (HOSPITAL_COMMUNITY): Payer: Self-pay | Admitting: Surgery

## 2019-05-06 LAB — COMPREHENSIVE METABOLIC PANEL
ALT: 61 U/L — ABNORMAL HIGH (ref 0–44)
AST: 43 U/L — ABNORMAL HIGH (ref 15–41)
Albumin: 3.5 g/dL (ref 3.5–5.0)
Alkaline Phosphatase: 76 U/L (ref 38–126)
Anion gap: 13 (ref 5–15)
BUN: 30 mg/dL — ABNORMAL HIGH (ref 8–23)
CO2: 20 mmol/L — ABNORMAL LOW (ref 22–32)
Calcium: 8.7 mg/dL — ABNORMAL LOW (ref 8.9–10.3)
Chloride: 99 mmol/L (ref 98–111)
Creatinine, Ser: 1.67 mg/dL — ABNORMAL HIGH (ref 0.61–1.24)
GFR calc Af Amer: 49 mL/min — ABNORMAL LOW (ref >60)
GFR calc non Af Amer: 42 mL/min — ABNORMAL LOW (ref >60)
Glucose, Bld: 132 mg/dL — ABNORMAL HIGH (ref 70–99)
Potassium: 4.5 mmol/L (ref 3.5–5.1)
Sodium: 132 mmol/L — ABNORMAL LOW (ref 135–145)
Total Bilirubin: 2.3 mg/dL — ABNORMAL HIGH (ref 0.3–1.2)
Total Protein: 6.4 g/dL — ABNORMAL LOW (ref 6.5–8.1)

## 2019-05-06 LAB — CBC
HCT: 41.8 % (ref 39.0–52.0)
Hemoglobin: 14.6 g/dL (ref 13.0–17.0)
MCH: 35.4 pg — ABNORMAL HIGH (ref 26.0–34.0)
MCHC: 34.9 g/dL (ref 30.0–36.0)
MCV: 101.2 fL — ABNORMAL HIGH (ref 80.0–100.0)
Platelets: 133 K/uL — ABNORMAL LOW (ref 150–400)
RBC: 4.13 MIL/uL — ABNORMAL LOW (ref 4.22–5.81)
RDW: 12.3 % (ref 11.5–15.5)
WBC: 15.7 K/uL — ABNORMAL HIGH (ref 4.0–10.5)
nRBC: 0 % (ref 0.0–0.2)

## 2019-05-06 NOTE — Progress Notes (Signed)
PROGRESS NOTE  GILDO CRISCO  DOB: 1956-02-09  PCP: Pearson Forster, MD LFY:[id number]  DOA: 05/03/2019  LOS: 3 days   Chief Complaint  Patient presents with  . Chest Pain   Brief narrative: Randy Beasley is a 63 y.o. male with PMH of HTN, CAD, MI s/p stent, CHF, ischemic cardiomyopathy EF 25 to 30%, hypothyroidism, seizures, hepatic steatosis. Patient presented to the ED on 05/03/2019 with complaint of abdominal pain radiating to the chest started after dinner, seated with nausea and bilious emesis.  In the ED, patient was afebrile and hemodynamically stable.  He had tenderness in the right upper quadrant. Work-up showed WBC 11.7, potassium 3.8, BUN/creatinine 19/1.91, AST/ALT/bilirubin elevated to 109/127/2.1  CT angio of the chest and abdomen revealed: 1. Findings suspicious of acute cholecystitis which include abnormal gallbladder with stones/sludge, evidence of pericholecystic inflammation, and superimposed pneumobilia. 2. Possible hepatic Cirrhosis, but no stigmata of portal venous hypertension. Extensive calcified coronary artery atherosclerosis and/or stent.   Patient was admitted under hospitalist medicine service for acute cholecystitis.  IV Zosyn was started.  General surgery as well as cardiology consultation was obtained.  Subjective: Patient was seen and examined this morning.  Pleasant elderly Caucasian male.  Underwent lap chole yesterday.  Feels better this morning.  On 4 L oxygen by nasal cannula.  Labs reviewed.  Creatinine improving, LFTs improving, WBC count improving.   Assessment/Plan: Acute cholecystitis  -Presented with abdominal pain, nausea, vomiting.  Positive HIDA scan -11/19, patient underwent laparoscopic cholecystectomy and drain placement by Dr. Magnus Ivan. -Currently in postoperative ileus. -Continue IV Zosyn.  With return of bowel function. -Encourage incentive spirometry, ambulation.  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  No evidence of portal hypertension at this time. -Likely related to hepatic steatosis as well as alcohol use. -Liver enzymes trending down -Patient will follow-up with GI as an outpatient.  Acute kidney injury on CKD stage 3a -Baseline creatinine 1.3, CKD stage 3a -Presented with creatinine elevated to 1.91, creatinine level gradually improving, 1.67 today.  Only gentle IV hydration because of systolic CHF.  History of systolic congestive heart failure -Echocardiogram 11/18 showed EF of 40 to 45% which is an improvement compared to previous. -Does not seem decompensated at this time.   -Home meds include Lasix, metoprolol, ramipril  Coronary artery disease/history of NSTEMI status post PCI: No chest pain. Cardiac enzymes negative. On aspirin 81 mg daily at home.  Hypothyroidism -continue Synthroid   Depression - continue BuSpar.    Mobility: Encourage ambulation DVT prophylaxis:  Subcu Lovenox Code Status:   Code Status: Full Code  Family Communication:  Not at bedside Expected Discharge:  Pending return of bowel function.  Anticipate 2-3 more days of hospital stay.  Consultants:  Cardiology, general surgery  Procedures:  None  Antimicrobials: Anti-infectives (From admission, onward)   Start     Dose/Rate Route Frequency Ordered Stop   05/04/19 1530  piperacillin-tazobactam (ZOSYN) IVPB 3.375 g     3.375 g 12.5 mL/hr over 240 Minutes Intravenous Every 8 hours 05/04/19 1523     05/04/19 0600  piperacillin-tazobactam (ZOSYN) IVPB 3.375 g  Status:  Discontinued     3.375 g 100 mL/hr over 30 Minutes Intravenous Every 8 hours 05/04/19 0228 05/04/19 1522   05/03/19 2200  piperacillin-tazobactam (ZOSYN) IVPB 3.375 g     3.375 g 100 mL/hr over 30 Minutes Intravenous  Once 05/03/19 2152  05/04/19 0011      Diet Order            Diet full liquid Room service  appropriate? Yes; Fluid consistency: Thin  Diet effective now              Infusions:  . lactated ringers 10 mL/hr at 05/05/19 1754  . piperacillin-tazobactam (ZOSYN)  IV 3.375 g (05/06/19 0910)    Scheduled Meds: . aspirin  81 mg Oral Daily  . cholecalciferol  1,000 Units Oral Daily  . enoxaparin (LOVENOX) injection  40 mg Subcutaneous Q24H  . furosemide  20 mg Oral Daily  . levothyroxine  125 mcg Oral Q0600  . metoprolol tartrate  12.5 mg Oral BID  . pneumococcal 23 valent vaccine  0.5 mL Intramuscular Tomorrow-1000    PRN meds: HYDROmorphone (DILAUDID) injection, oxyCODONE   Objective: Vitals:   05/06/19 0416 05/06/19 0800  BP: 118/89   Pulse: 69   Resp: 12   Temp: 98.2 F (36.8 C) 97.9 F (36.6 C)  SpO2: 92%     Intake/Output Summary (Last 24 hours) at 05/06/2019 1349 Last data filed at 05/06/2019 0915 Gross per 24 hour  Intake 1158.33 ml  Output 110 ml  Net 1048.33 ml   Filed Weights   05/04/19 1530 05/04/19 1548 05/05/19 0311  Weight: 109.6 kg 108.6 kg 109.4 kg   Weight change:  Body mass index is 33.64 kg/m.   Physical Exam:  General exam: Appears calm and comfortable.  Pain controlled.  More comfortable today Skin: No rashes, lesions or ulcers. HEENT: Atraumatic, normocephalic, supple neck, no obvious bleeding Lungs: Clear to auscultation bilaterally CVS: Regular rate and rhythm, no murmur GI/Abd soft, mild appropriate soreness in the abdomen.  JP drain on the right upper quadrant draining serosanguineous fluid.  Bowel sounds absent. CNS: Alert, awake, oriented x3 Psychiatry: Mood appropriate  Extremities: No pedal edema, no calf tenderness  Data Review: I have personally reviewed the laboratory data and studies available.  Recent Labs  Lab 05/03/19 2040 05/03/19 2051 05/04/19 0012 05/04/19 0330 05/05/19 0325 05/06/19 0237  WBC 11.7*  --  11.1* 13.3* 19.3* 15.7*  HGB 16.8 17.0 16.4 16.1 15.3 14.6  HCT 47.1 50.0 48.3 48.7 45.2 41.8   MCV 99.2  --  103.2* 104.5* 103.0* 101.2*  PLT 209  --  163 171 141* 133*   Recent Labs  Lab 05/03/19 2040 05/03/19 2051 05/04/19 0012 05/04/19 0330 05/05/19 0325 05/06/19 0237  NA 137 139  --  138 133* 132*  K 3.8 3.9  --  4.2 4.3 4.5  CL 103 104  --  104 101 99  CO2 20*  --   --  17* 21* 20*  GLUCOSE 131* 125*  --  105* 104* 132*  BUN 19 20  --  20 24* 30*  CREATININE 1.91* 1.80* 1.66* 1.54* 1.75* 1.67*  CALCIUM 10.0  --   --  9.4 8.7* 8.7*  MG  --   --  2.3  --   --   --   PHOS  --   --  3.8  --   --   --     Terrilee Croak, MD  [facility] 05/06/2019

## 2019-05-06 NOTE — Progress Notes (Signed)
[facility]/Trauma Progress Note  1 Day Post-Op   Assessment/Plan Hx of STEMI s/p stent to LAD Hx of seizures - has been off antiepileptics for several years with no seizures OA Chronic systolic CHF Hypothyroidism Alcohol abuse with possible cirrhosis - drinks 3-4 beers daily on average Tobacco abuse - smokes 3-4 cigarettes daily  RUQ pain Elevated LFTs Probable cholecystitis - S/P laparoscopic cholecystectomy and drain placement, Dr. Magnus Ivan, 11/19 - ambulate, IS, advance diet as tolerated, monitor drain output  FEN: FLD VTE: SCD's, lovenox ID: Zosyn 11/17>> Foley: none Follow up: [facility]     LOS: 3 days    Subjective: CC: abdominal soreness  Abdominal pain is more soreness than sharp. Pain improved since yesterday. No nausea, vomiting, fever or chills overnight. No flatus. Not much of an appetite. He is needing O2 to keep his sats up. He does not use home O2. No IS in room.   Objective: Vital signs in last 24 hours: Temp:  [97.7 F (36.5 C)-99.4 F (37.4 C)] 98.2 F (36.8 C) (11/20 0416) Pulse Rate:  [69-90] 69 (11/20 0416) Resp:  [12-29] 12 (11/20 0416) BP: (112-138)/(77-93) 118/89 (11/20 0416) SpO2:  [90 %-94 %] 92 % (11/20 0416) Last BM Date: 05/03/19  Intake/Output from previous day: 11/19 0701 - 11/20 0700 In: 918.3 [I.V.:818.3; IV Piggyback:100] Out: 110 [Drains:110] Intake/Output this shift: No intake/output data recorded.  PE:  Gen:  Alert, NAD, pleasant, cooperative Pulm:  Rate and effort normal, on [place] Abd: Soft, distended, +BS, incisions with glue intact appear well. Ecchymosis noted around umbilicus. Drain is sanguinous. Mild TTP without guarding. No peritonitis  Skin: no rashes noted, warm and dry   Anti-infectives: Anti-infectives (From admission, onward)   Start     Dose/Rate Route Frequency Ordered Stop   05/04/19 1530  piperacillin-tazobactam (ZOSYN) IVPB 3.375 g     3.375 g 12.5 mL/hr over 240 Minutes Intravenous Every 8  hours 05/04/19 1523     05/04/19 0600  piperacillin-tazobactam (ZOSYN) IVPB 3.375 g  Status:  Discontinued     3.375 g 100 mL/hr over 30 Minutes Intravenous Every 8 hours 05/04/19 0228 05/04/19 1522   05/03/19 2200  piperacillin-tazobactam (ZOSYN) IVPB 3.375 g     3.375 g 100 mL/hr over 30 Minutes Intravenous  Once 05/03/19 2152 05/04/19 0011      Lab Results:  Recent Labs    05/05/19 0325 05/06/19 0237  WBC 19.3* 15.7*  HGB 15.3 14.6  HCT 45.2 41.8  PLT 141* 133*   BMET Recent Labs    05/05/19 0325 05/06/19 0237  NA 133* 132*  K 4.3 4.5  CL 101 99  CO2 21* 20*  GLUCOSE 104* 132*  BUN 24* 30*  CREATININE 1.75* 1.67*  CALCIUM 8.7* 8.7*   PT/INR Recent Labs    05/04/19 0330  LABPROT 13.7  INR 1.1   CMP     Component Value Date/Time   NA 132 (L) 05/06/2019 0237   NA 142 07/28/2017 0847   K 4.5 05/06/2019 0237   CL 99 05/06/2019 0237   CO2 20 (L) 05/06/2019 0237   GLUCOSE 132 (H) 05/06/2019 0237   BUN 30 (H) 05/06/2019 0237   BUN 17 07/28/2017 0847   CREATININE 1.67 (H) 05/06/2019 0237   CREATININE 1.25 12/24/2015 0827   CALCIUM 8.7 (L) 05/06/2019 0237   PROT 6.4 (L) 05/06/2019 0237   ALBUMIN 3.5 05/06/2019 0237   AST 43 (H) 05/06/2019 0237   ALT 61 (H) 05/06/2019 0237   ALKPHOS  76 05/06/2019 0237   BILITOT 2.3 (H) 05/06/2019 0237   GFRNONAA 42 (L) 05/06/2019 0237   GFRAA 49 (L) 05/06/2019 0237   Lipase     Component Value Date/Time   LIPASE 38 05/03/2019 2040    Studies/Results: Nm Hepatobiliary Liver Func  Result Date: 05/04/2019 CLINICAL DATA:  Cholelithiasis. EXAM: NUCLEAR MEDICINE HEPATOBILIARY IMAGING TECHNIQUE: Sequential images of the abdomen were obtained out to 60 minutes following intravenous administration of radiopharmaceutical. Patient was additionally administered 3 mg morphine and subsequent images were obtained after 30 minutes. RADIOPHARMACEUTICALS:  5.1 mCi Tc-50m  Choletec IV COMPARISON:  Same-day ultrasound and CT angiography  of the chest, abdomen pelvis. FINDINGS: Prompt uptake and biliary excretion of activity by the liver is seen. Biliary activity passes into small bowel, consistent with patent common bile duct. There is nonvisualization of activity within the gallbladder both in the initial 60 minutes of imaging and 30 minute imaging following morphine administration. IMPRESSION: Absent visualization of the gallbladder is compatible with acute cholecystitis and/or cystic duct obstruction given the comparison imaging findings. Electronically Signed   By: Lovena Le M.D.   On: 05/04/2019 14:30     Kalman Drape, [facility] Surgery Please see amion for pager for the following: Cristine Polio, & Friday 7:00am - 4:30pm Thursdays 7:00am -11:30am

## 2019-05-06 NOTE — Progress Notes (Signed)
Walked with pt in the hall this am on room air. Pt desated to 87% for a few seconds. Pt instructed to breathe at normal pace when walking. O2 sat went back up to 93%. Pt tolerated walking well. Pt walked about 100 ft around the unit. Will continue to monitor.

## 2019-05-06 NOTE — Care Management Important Message (Signed)
Important Message  Patient Details  Name: Randy Beasley MRN: [id number] Date of Birth: 27-Aug-1955   Medicare Important Message Given:  Yes     Orbie Pyo 05/06/2019, 3:45 PM

## 2019-05-07 ENCOUNTER — Inpatient Hospital Stay (HOSPITAL_COMMUNITY): Payer: Medicare HMO

## 2019-05-07 LAB — SURGICAL PATHOLOGY

## 2019-05-07 MED ORDER — LORAZEPAM 1 MG PO TABS
1.0000 mg | ORAL_TABLET | ORAL | Status: AC | PRN
  Administered 2019-05-07: 1 mg via ORAL
  Filled 2019-05-07: qty 1

## 2019-05-07 MED ORDER — THIAMINE HCL 100 MG/ML IJ SOLN
100.0000 mg | Freq: Every day | INTRAMUSCULAR | Status: DC
  Administered 2019-05-07: 100 mg via INTRAVENOUS
  Filled 2019-05-07: qty 2

## 2019-05-07 MED ORDER — ONDANSETRON HCL 4 MG/2ML IJ SOLN
4.0000 mg | Freq: Four times a day (QID) | INTRAMUSCULAR | Status: DC | PRN
  Administered 2019-05-07 – 2019-05-11 (×3): 4 mg via INTRAVENOUS
  Filled 2019-05-07 (×3): qty 2

## 2019-05-07 MED ORDER — ADULT MULTIVITAMIN W/MINERALS CH
1.0000 | ORAL_TABLET | Freq: Every day | ORAL | Status: DC
  Administered 2019-05-08 – 2019-05-15 (×4): 1 via ORAL
  Filled 2019-05-07 (×7): qty 1

## 2019-05-07 MED ORDER — VITAMIN B-1 100 MG PO TABS
100.0000 mg | ORAL_TABLET | Freq: Every day | ORAL | Status: DC
  Administered 2019-05-08 – 2019-05-15 (×8): 100 mg via ORAL
  Filled 2019-05-07 (×8): qty 1

## 2019-05-07 MED ORDER — METOCLOPRAMIDE HCL 5 MG/ML IJ SOLN
10.0000 mg | Freq: Three times a day (TID) | INTRAMUSCULAR | Status: DC | PRN
  Administered 2019-05-07 – 2019-05-08 (×3): 10 mg via INTRAVENOUS
  Filled 2019-05-07 (×3): qty 2

## 2019-05-07 MED ORDER — LORAZEPAM 2 MG/ML IJ SOLN: 1.0000 mg | INTRAMUSCULAR | Status: AC | PRN

## 2019-05-07 MED ORDER — FOLIC ACID 1 MG PO TABS
1.0000 mg | ORAL_TABLET | Freq: Every day | ORAL | Status: DC
  Administered 2019-05-07 – 2019-05-15 (×9): 1 mg via ORAL
  Filled 2019-05-07 (×9): qty 1

## 2019-05-07 NOTE — Progress Notes (Signed)
PROGRESS NOTE  Randy Beasley  DOB: 05/16/1956  PCP: Pearson Forster, MD PJK:[id number]  DOA: 05/03/2019  LOS: 4 days   Chief Complaint  Patient presents with  . Chest Pain   Brief narrative: Randy Beasley is a 63 y.o. male with PMH of HTN, CAD, MI s/p stent, CHF, ischemic cardiomyopathy EF 25 to 30%, hypothyroidism, seizures, hepatic steatosis. Patient presented to the ED on 05/03/2019 with complaint of abdominal pain radiating to the chest started after dinner, seated with nausea and bilious emesis.  In the ED, patient was afebrile and hemodynamically stable.  He had tenderness in the right upper quadrant. Work-up showed WBC 11.7, potassium 3.8, BUN/creatinine 19/1.91, AST/ALT/bilirubin elevated to 109/127/2.1  CT angio of the chest and abdomen showed: 1. Findings suspicious of acute cholecystitis which include abnormal gallbladder with stones/sludge, evidence of pericholecystic inflammation, and superimposed pneumobilia. 2. Possible hepatic Cirrhosis, but no stigmata of portal venous hypertension. Extensive calcified coronary artery atherosclerosis and/or stent.   Patient was admitted under hospitalist medicine service for acute cholecystitis.  IV Zosyn was started.  General surgery as well as cardiology consultation was obtained.  Subjective: Patient was seen and examined this morning.  Patient s/p.  He feels a lot of gas in his abdomen and is burping as well but unable to pass from below.  He gets hiccups at times which makes the pain worse.   On 4 L oxygen by nasal cannula.  Labs not available today.  I will order for tomorrow.  Assessment/Plan: Acute cholecystitis  -Presented with abdominal pain, nausea, vomiting.  Positive HIDA scan -11/19, patient underwent laparoscopic cholecystectomy and drain placement by Dr. Magnus Ivan. -Currently in postoperative ileus.  Bowel sounds present, patient feels gassy but unable to pass gas yet. -Continue IV Zosyn.  With return of  bowel function. -Encourage incentive spirometry, ambulation. 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 No evidence of portal hypertension at this time. -Likely related to hepatic steatosis as well as alcohol use. -Liver enzymes trending down -Patient will follow-up with GI as an outpatient.  Acute kidney injury on CKD stage 3a -Baseline creatinine 1.3, CKD stage 3a -Presented with creatinine elevated to 1.91, creatinine level gradually improving, 1.67 on last check on 11/20.  Only on gentle IV hydration because of systolic CHF.  History of systolic congestive heart failure -Echocardiogram 11/18 showed EF of 40 to 45% which is an improvement compared to previous. -Does not seem decompensated at this time.   -Home meds include Lasix, metoprolol, ramipril  Coronary artery disease/history of NSTEMI status post PCI: No chest pain. Cardiac enzymes negative. On aspirin 81 mg daily at home.  Hypothyroidism -continue Synthroid   Depression - continue BuSpar.    Mobility: Encourage ambulation DVT prophylaxis:  Subcu Lovenox Code Status:   Code Status: Full Code  Family Communication:  Not at bedside Expected Discharge:  Pending return of bowel function.  Anticipate 2-3 more days of hospital stay.  Consultants:  Cardiology, general surgery  Procedures:  None  Antimicrobials: Anti-infectives (From admission, onward)   Start     Dose/Rate Route Frequency Ordered Stop   05/04/19 1530  piperacillin-tazobactam (ZOSYN) IVPB 3.375 g     3.375 g 12.5 mL/hr over 240 Minutes Intravenous Every 8 hours 05/04/19 1523     05/04/19 0600  piperacillin-tazobactam (ZOSYN) IVPB 3.375 g  Status:  Discontinued     3.375 g 100 mL/hr over 30 Minutes Intravenous Every 8 hours 05/04/19 0228 05/04/19 1522   05/03/19 2200   piperacillin-tazobactam (ZOSYN) IVPB 3.375 g     3.375 g 100 mL/hr over 30 Minutes Intravenous  Once 05/03/19 2152 05/04/19 0011      Diet Order            Diet full liquid Room service appropriate? Yes; Fluid consistency: Thin  Diet effective now              Infusions:  . lactated ringers 10 mL/hr at 05/05/19 1754  . piperacillin-tazobactam (ZOSYN)  IV 3.375 g (05/06/19 2231)    Scheduled Meds: . aspirin  81 mg Oral Daily  . cholecalciferol  1,000 Units Oral Daily  . enoxaparin (LOVENOX) injection  40 mg Subcutaneous Q24H  . furosemide  20 mg Oral Daily  . levothyroxine  125 mcg Oral Q0600  . metoprolol tartrate  12.5 mg Oral BID  . pneumococcal 23 valent vaccine  0.5 mL Intramuscular Tomorrow-1000    PRN meds: HYDROmorphone (DILAUDID) injection, metoCLOPramide (REGLAN) injection, ondansetron (ZOFRAN) IV, oxyCODONE   Objective: Vitals:   05/07/19 0800 05/07/19 0842  BP:  121/76  Pulse: 92 96  Resp: 14 16  Temp:  98.9 F (37.2 C)  SpO2: 93% 91%    Intake/Output Summary (Last 24 hours) at 05/07/2019 0940 Last data filed at 05/07/2019 0529 Gross per 24 hour  Intake 631.81 ml  Output 150 ml  Net 481.81 ml   Filed Weights   05/04/19 1530 05/04/19 1548 05/05/19 0311  Weight: 109.6 kg 108.6 kg 109.4 kg   Weight change:  Body mass index is 33.64 kg/m.   Physical Exam:  General exam: In mild to moderate distress because of abdominal distention, nausea.   Skin: No rashes, lesions or ulcers. HEENT: Atraumatic, normocephalic, supple neck, no obvious bleeding Lungs: Clear to auscultation bilaterally CVS: Regular rate and rhythm, no murmur GI/Abd: Distended, tympanic, bowel sound present. JP drain on the right upper quadrant draining serosanguineous fluid.   CNS: Alert, awake, oriented x3 Psychiatry: frustrated with persistent nausea. Extremities: No pedal edema, no calf tenderness  Data Review: I have personally reviewed the laboratory data and studies  available.  Recent Labs  Lab 05/03/19 2040 05/03/19 2051 05/04/19 0012 05/04/19 0330 05/05/19 0325 05/06/19 0237  WBC 11.7*  --  11.1* 13.3* 19.3* 15.7*  HGB 16.8 17.0 16.4 16.1 15.3 14.6  HCT 47.1 50.0 48.3 48.7 45.2 41.8  MCV 99.2  --  103.2* 104.5* 103.0* 101.2*  PLT 209  --  163 171 141* 133*   Recent Labs  Lab 05/03/19 2040 05/03/19 2051 05/04/19 0012 05/04/19 0330 05/05/19 0325 05/06/19 0237  NA 137 139  --  138 133* 132*  K 3.8 3.9  --  4.2 4.3 4.5  CL 103 104  --  104 101 99  CO2 20*  --   --  17* 21* 20*  GLUCOSE 131* 125*  --  105* 104* 132*  BUN 19 20  --  20 24* 30*  CREATININE 1.91* 1.80* 1.66* 1.54* 1.75* 1.67*  CALCIUM 10.0  --   --  9.4 8.7* 8.7*  MG  --   --  2.3  --   --   --   PHOS  --   --  3.8  --   --   --     Terrilee Croak, MD  [facility] 05/07/2019

## 2019-05-07 NOTE — Progress Notes (Signed)
Progress Note: General Surgery Service   Chief Complaint/Subjective: Nauseated this morning, worsened pain, no BMs  Objective: Vital signs in last 24 hours: Temp:  [98.1 F (36.7 C)-98.9 F (37.2 C)] 98.9 F (37.2 C) (11/21 0842) Pulse Rate:  [73-96] 96 (11/21 0842) Resp:  [12-22] 16 (11/21 0842) BP: (119-134)/(76-90) 121/76 (11/21 0842) SpO2:  [90 %-93 %] 91 % (11/21 0842) Last BM Date: 05/03/19  Intake/Output from previous day: 11/20 0701 - 11/21 0700 In: 871.8 [P.O.:700; I.V.:20.7; IV Piggyback:151.1] Out: 150 [Drains:150] Intake/Output this shift: No intake/output data recorded.  Gen: NAd  Resp: nonlabored  Card: RRR  Abd: incisions c/d/i, drain with moderate serous output, periumbilical tenderness, moderate distension  Lab Results: CBC  Recent Labs    05/05/19 0325 05/06/19 0237  WBC 19.3* 15.7*  HGB 15.3 14.6  HCT 45.2 41.8  PLT 141* 133*   BMET Recent Labs    05/05/19 0325 05/06/19 0237  NA 133* 132*  K 4.3 4.5  CL 101 99  CO2 21* 20*  GLUCOSE 104* 132*  BUN 24* 30*  CREATININE 1.75* 1.67*  CALCIUM 8.7* 8.7*   PT/INR No results for input(s): LABPROT, INR in the last 72 hours. ABG No results for input(s): PHART, HCO3 in the last 72 hours.  Invalid input(s): PCO2, PO2  Anti-infectives: Anti-infectives (From admission, onward)   Start     Dose/Rate Route Frequency Ordered Stop   05/04/19 1530  piperacillin-tazobactam (ZOSYN) IVPB 3.375 g     3.375 g 12.5 mL/hr over 240 Minutes Intravenous Every 8 hours 05/04/19 1523     05/04/19 0600  piperacillin-tazobactam (ZOSYN) IVPB 3.375 g  Status:  Discontinued     3.375 g 100 mL/hr over 30 Minutes Intravenous Every 8 hours 05/04/19 0228 05/04/19 1522   05/03/19 2200  piperacillin-tazobactam (ZOSYN) IVPB 3.375 g     3.375 g 100 mL/hr over 30 Minutes Intravenous  Once 05/03/19 2152 05/04/19 0011      Medications: Scheduled Meds: . aspirin  81 mg Oral Daily  . cholecalciferol  1,000 Units Oral  Daily  . enoxaparin (LOVENOX) injection  40 mg Subcutaneous Q24H  . furosemide  20 mg Oral Daily  . levothyroxine  125 mcg Oral Q0600  . metoprolol tartrate  12.5 mg Oral BID  . pneumococcal 23 valent vaccine  0.5 mL Intramuscular Tomorrow-1000   Continuous Infusions: . lactated ringers 10 mL/hr at 05/05/19 1754  . piperacillin-tazobactam (ZOSYN)  IV 3.375 g (05/07/19 0953)   PRN Meds:.HYDROmorphone (DILAUDID) injection, metoCLOPramide (REGLAN) injection, ondansetron (ZOFRAN) IV, oxyCODONE  Assessment/Plan: s/p Procedure(s): LAPAROSCOPIC CHOLECYSTECTOMY 05/05/2019  Cholecystitis s/p lap chole 11/19, LFTs improving, drain with higher volume output concerning for ascites related to cirrhosis vs 3rd spacing postoperatively Nausea - concerning for ileus, recommended just water and ice chips and get XR  FEN- clears VTE- SCDs lovenox ID- Zosyn 11/17 >> Dispo- nausea and pain, not tolerating diet, XR today   LOS: 4 days   Mickeal Skinner, MD [facility]

## 2019-05-07 NOTE — Progress Notes (Addendum)
PHARMACY - PHYSICIAN COMMUNICATION CRITICAL VALUE ALERT - BLOOD CULTURE IDENTIFICATION (BCID)  Randy Beasley is an 63 y.o. male who presented to [facility] on 05/03/2019 with a chief complaint of abdominal pain and cholecystitis s/p cholecystectomy 11/19  Assessment:   Gram Stain from 1/2 blood cultures showing gram positive rods  Name of physician (or Provider) Contacted: X Blount  Current antibiotics: Zosyn  Changes to prescribed antibiotics recommended:   None at this time.  F/U cultures results  No results found for this or any previous visit.  Randy Beasley 05/07/2019  6:35 AM

## 2019-05-08 LAB — CBC WITH DIFFERENTIAL/PLATELET
Abs Immature Granulocytes: 0.12 10*3/uL — ABNORMAL HIGH (ref 0.00–0.07)
Basophils Absolute: 0 10*3/uL (ref 0.0–0.1)
Basophils Relative: 0 %
Eosinophils Absolute: 0 10*3/uL (ref 0.0–0.5)
Eosinophils Relative: 0 %
HCT: 46.7 % (ref 39.0–52.0)
Hemoglobin: 16.4 g/dL (ref 13.0–17.0)
Immature Granulocytes: 1 %
Lymphocytes Relative: 11 %
Lymphs Abs: 1.9 10*3/uL (ref 0.7–4.0)
MCH: 35 pg — ABNORMAL HIGH (ref 26.0–34.0)
MCHC: 35.1 g/dL (ref 30.0–36.0)
MCV: 99.8 fL (ref 80.0–100.0)
Monocytes Absolute: 1.3 10*3/uL — ABNORMAL HIGH (ref 0.1–1.0)
Monocytes Relative: 8 %
Neutro Abs: 13.6 10*3/uL — ABNORMAL HIGH (ref 1.7–7.7)
Neutrophils Relative %: 80 %
Platelets: 244 10*3/uL (ref 150–400)
RBC: 4.68 MIL/uL (ref 4.22–5.81)
RDW: 12.3 % (ref 11.5–15.5)
WBC: 17 10*3/uL — ABNORMAL HIGH (ref 4.0–10.5)
nRBC: 0 % (ref 0.0–0.2)

## 2019-05-08 LAB — BASIC METABOLIC PANEL
Anion gap: 13 (ref 5–15)
BUN: 46 mg/dL — ABNORMAL HIGH (ref 8–23)
CO2: 22 mmol/L (ref 22–32)
Calcium: 9.3 mg/dL (ref 8.9–10.3)
Chloride: 98 mmol/L (ref 98–111)
Creatinine, Ser: 1.86 mg/dL — ABNORMAL HIGH (ref 0.61–1.24)
GFR calc Af Amer: 43 mL/min — ABNORMAL LOW (ref >60)
GFR calc non Af Amer: 37 mL/min — ABNORMAL LOW (ref >60)
Glucose, Bld: 117 mg/dL — ABNORMAL HIGH (ref 70–99)
Potassium: 4.1 mmol/L (ref 3.5–5.1)
Sodium: 133 mmol/L — ABNORMAL LOW (ref 135–145)

## 2019-05-08 LAB — PHOSPHORUS: Phosphorus: 4.8 mg/dL — ABNORMAL HIGH (ref 2.5–4.6)

## 2019-05-08 LAB — MAGNESIUM: Magnesium: 2.7 mg/dL — ABNORMAL HIGH (ref 1.7–2.4)

## 2019-05-08 MED ORDER — METOCLOPRAMIDE HCL 5 MG/ML IJ SOLN
10.0000 mg | Freq: Four times a day (QID) | INTRAMUSCULAR | Status: DC
  Administered 2019-05-08 – 2019-05-15 (×27): 10 mg via INTRAVENOUS
  Filled 2019-05-08 (×29): qty 2

## 2019-05-08 NOTE — Progress Notes (Signed)
3 Days Post-Op   Subjective/Chief Complaint: Pt with some abd distention No bowel fxn   Objective: Vital signs in last 24 hours: Temp:  [98.3 F (36.8 C)-98.9 F (37.2 C)] 98.3 F (36.8 C) (11/22 0811) Pulse Rate:  [84-99] 86 (11/22 0811) Resp:  [11-20] 19 (11/22 0811) BP: (101-121)/(66-93) 120/93 (11/22 0811) SpO2:  [89 %-95 %] 95 % (11/22 0811) Last BM Date: 05/03/19  Intake/Output from previous day: 11/21 0701 - 11/22 0700 In: 290 [P.O.:240; IV Piggyback:50] Out: 810 [Drains:810] Intake/Output this shift: No intake/output data recorded.  Constitutional: No acute distress, conversant, appears states age. Eyes: Anicteric sclerae, moist conjunctiva, no lid lag Lungs: Clear to auscultation bilaterally, normal respiratory effort CV: regular rate and rhythm, no murmurs, no peripheral edema, pedal pulses 2+ GI: Soft, no masses or hepatosplenomegaly, non-tender to palpation, disteneded, JP serous Skin: No rashes, palpation reveals normal turgor Psychiatric: appropriate judgment and insight, oriented to person, place, and time   Lab Results:  Recent Labs    05/06/19 0237 05/08/19 0232  WBC 15.7* 17.0*  HGB 14.6 16.4  HCT 41.8 46.7  PLT 133* 244   BMET Recent Labs    05/06/19 0237 05/08/19 0232  NA 132* 133*  K 4.5 4.1  CL 99 98  CO2 20* 22  GLUCOSE 132* 117*  BUN 30* 46*  CREATININE 1.67* 1.86*  CALCIUM 8.7* 9.3   PT/INR No results for input(s): LABPROT, INR in the last 72 hours. ABG No results for input(s): PHART, HCO3 in the last 72 hours.  Invalid input(s): PCO2, PO2  Studies/Results: Dg Abd Portable 1v  Result Date: 05/07/2019 CLINICAL DATA:  Nausea EXAM: PORTABLE ABDOMEN - 1 VIEW COMPARISON:  CT abdomen pelvis dated 05/03/2019 FINDINGS: Multiple dilated gas-filled loops of small bowel are seen throughout the abdomen. Air-fluid levels and free intraperitoneal air cannot be excluded on the supine exam. Cholecystectomy clips overlie the gallbladder  fossa. IMPRESSION: Multiple dilated loops of bowel throughout the abdomen. Air-fluid levels cannot be evaluated on the supine exam and these findings may reflect ileus or small-bowel obstruction. Electronically Signed   By: Zerita Boers M.D.   On: 05/07/2019 11:43    Anti-infectives: Anti-infectives (From admission, onward)   Start     Dose/Rate Route Frequency Ordered Stop   05/04/19 1530  piperacillin-tazobactam (ZOSYN) IVPB 3.375 g     3.375 g 12.5 mL/hr over 240 Minutes Intravenous Every 8 hours 05/04/19 1523     05/04/19 0600  piperacillin-tazobactam (ZOSYN) IVPB 3.375 g  Status:  Discontinued     3.375 g 100 mL/hr over 30 Minutes Intravenous Every 8 hours 05/04/19 0228 05/04/19 1522   05/03/19 2200  piperacillin-tazobactam (ZOSYN) IVPB 3.375 g     3.375 g 100 mL/hr over 30 Minutes Intravenous  Once 05/03/19 2152 05/04/19 0011      Assessment/Plan: s/p Procedure(s): LAPAROSCOPIC CHOLECYSTECTOMY 05/05/2019  Cholecystitis s/p lap chole 11/19, LFTs improving, drain with higher volume output concerning for ascites related to cirrhosis vs 3rd spacing postoperatively Ileus developing - con't just water and ice chips, mobilize   FEN- con't sips and chips VTE- SCDs lovenox ID- Zosyn 11/17 >> Dispo- nausea and pain, not tolerating diet,   LOS: 5 days    Ralene Ok 05/08/2019

## 2019-05-08 NOTE — Progress Notes (Signed)
Patient ambulated 175 feet in hallway. Tolerated well.

## 2019-05-08 NOTE — Progress Notes (Signed)
PROGRESS NOTE  Randy Beasley  DOB: 12-02-1955  PCP: Ivan Anchors, MD YBO:[id number]  DOA: 05/03/2019  LOS: 5 days   Chief Complaint  Patient presents with  . Chest Pain   Brief narrative: Randy Beasley is a 63 y.o. male with PMH of HTN, CAD, MI s/p stent, CHF, ischemic cardiomyopathy EF 25 to 30%, hypothyroidism, seizures, hepatic steatosis. Patient presented to the ED on 05/03/2019 with complaint of abdominal pain radiating to the chest started after dinner, seated with nausea and bilious emesis.  In the ED, patient was afebrile and hemodynamically stable.  He had tenderness in the right upper quadrant. Work-up showed WBC 11.7, potassium 3.8, BUN/creatinine 19/1.91, AST/ALT/bilirubin elevated to 109/127/2.1  CT angio of the chest and abdomen showed: 1. Findings suspicious of acute cholecystitis which include abnormal gallbladder with stones/sludge, evidence of pericholecystic inflammation, and superimposed pneumobilia. 2. Possible hepatic Cirrhosis, but no stigmata of portal venous hypertension. Extensive calcified coronary artery atherosclerosis and/or stent.   Patient was admitted under hospitalist medicine service for acute cholecystitis.  IV Zosyn was started.  General surgery as well as cardiology consultation was obtained.  Subjective: Patient was seen and examined this afternoon.  States he is passing a lot of gas.  Abdomen remains distended.  Ambulated on the hallway this afternoon. On 4 L oxygen by nasal cannula.  Creatinine trending up, 1.86 today  Assessment/Plan: Acute cholecystitis  -Presented with abdominal pain, nausea, vomiting.  Positive HIDA scan -11/19, patient underwent laparoscopic cholecystectomy and drain placement by Dr. Ninfa Linden. -Currently in postoperative ileus.  Bowel sounds present, patient feels gassy but unable to pass gas yet. -Continue IV Zosyn.  Await further return of bowel function. -Encourage incentive spirometry, ambulation. 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 No evidence of portal hypertension at this time. -Likely related to hepatic steatosis as well as alcohol use. -Liver enzymes trending down -Patient will follow-up with GI as an outpatient.  Acute kidney injury on CKD stage 3a -Baseline creatinine 1.3, CKD stage 3a -Creatinine creatinine trending up, 1.86 today.  Increase fluid 200 mL/h.  Lasix remains on hold.  History of systolic congestive heart failure -Echocardiogram 11/18 showed EF of 40 to 45% which is an improvement compared to previous. -Does not seem decompensated at this time.   -Home meds include Lasix, metoprolol, ramipril  Coronary artery disease/history of NSTEMI status post PCI: No chest pain. Cardiac enzymes negative. On aspirin 81 mg daily at home.  Hypothyroidism -continue Synthroid   Depression - continue BuSpar.    Mobility: Encourage ambulation DVT prophylaxis:  Subcu Lovenox Code Status:   Code Status: Full Code  Family Communication:  Not at bedside Expected Discharge:  Pending return of bowel function.  Anticipate 2-3 more days of hospital stay.  Consultants:  Cardiology, general surgery  Procedures:  None  Antimicrobials: Anti-infectives (From admission, onward)   Start     Dose/Rate Route Frequency Ordered Stop   05/04/19 1530  piperacillin-tazobactam (ZOSYN) IVPB 3.375 g     3.375 g 12.5 mL/hr over 240 Minutes Intravenous Every 8 hours 05/04/19 1523     05/04/19 0600  piperacillin-tazobactam (ZOSYN) IVPB 3.375 g  Status:  Discontinued     3.375 g 100 mL/hr over 30 Minutes Intravenous Every 8 hours 05/04/19 0228 05/04/19 1522   05/03/19 2200  piperacillin-tazobactam (ZOSYN) IVPB 3.375 g  3.375 g 100 mL/hr over 30 Minutes Intravenous  Once 05/03/19 2152 05/04/19 0011      Diet Order             Diet full liquid Room service appropriate? Yes; Fluid consistency: Thin  Diet effective now              Infusions:  . lactated ringers 100 mL/hr at 05/08/19 0843  . piperacillin-tazobactam (ZOSYN)  IV 3.375 g (05/08/19 0826)    Scheduled Meds: . aspirin  81 mg Oral Daily  . cholecalciferol  1,000 Units Oral Daily  . enoxaparin (LOVENOX) injection  40 mg Subcutaneous Q24H  . folic acid  1 mg Oral Daily  . levothyroxine  125 mcg Oral Q0600  . metoCLOPramide (REGLAN) injection  10 mg Intravenous Q6H  . metoprolol tartrate  12.5 mg Oral BID  . multivitamin with minerals  1 tablet Oral Daily  . pneumococcal 23 valent vaccine  0.5 mL Intramuscular Tomorrow-1000  . thiamine  100 mg Oral Daily   Or  . thiamine  100 mg Intravenous Daily    PRN meds: HYDROmorphone (DILAUDID) injection, LORazepam **OR** LORazepam, ondansetron (ZOFRAN) IV, oxyCODONE   Objective: Vitals:   05/08/19 0811 05/08/19 0921  BP: (!) 120/93   Pulse: 86 92  Resp: 19 16  Temp: 98.3 F (36.8 C)   SpO2: 95% 92%    Intake/Output Summary (Last 24 hours) at 05/08/2019 1459 Last data filed at 05/08/2019 1226 Gross per 24 hour  Intake 957.86 ml  Output 480 ml  Net 477.86 ml   Filed Weights   05/04/19 1530 05/04/19 1548 05/05/19 0311  Weight: 109.6 kg 108.6 kg 109.4 kg   Weight change:  Body mass index is 33.64 kg/m.   Physical Exam:  General exam: In mild to moderate distress because of abdominal distention, nausea.   Skin: No rashes, lesions or ulcers. HEENT: Atraumatic, normocephalic, supple neck, no obvious bleeding Lungs: Diminished air entry in both bases. CVS: Regular rate and rhythm, no murmur GI/Abd: Distended, tympanic, bowel sound present. JP drain on the right upper quadrant draining serosanguineous fluid.   CNS: Alert, awake, oriented x3 Psychiatry: frustrated with persistent nausea. Extremities: No pedal edema, no calf tenderness  Data Review: I have personally reviewed  the laboratory data and studies available.  Recent Labs  Lab 05/04/19 0012 05/04/19 0330 05/05/19 0325 05/06/19 0237 05/08/19 0232  WBC 11.1* 13.3* 19.3* 15.7* 17.0*  NEUTROABS  --   --   --   --  13.6*  HGB 16.4 16.1 15.3 14.6 16.4  HCT 48.3 48.7 45.2 41.8 46.7  MCV 103.2* 104.5* 103.0* 101.2* 99.8  PLT 163 171 141* 133* 244   Recent Labs  Lab 05/03/19 2040 05/03/19 2051 05/04/19 0012 05/04/19 0330 05/05/19 0325 05/06/19 0237 05/08/19 0232  NA 137 139  --  138 133* 132* 133*  K 3.8 3.9  --  4.2 4.3 4.5 4.1  CL 103 104  --  104 101 99 98  CO2 20*  --   --  17* 21* 20* 22  GLUCOSE 131* 125*  --  105* 104* 132* 117*  BUN 19 20  --  20 24* 30* 46*  CREATININE 1.91* 1.80* 1.66* 1.54* 1.75* 1.67* 1.86*  CALCIUM 10.0  --   --  9.4 8.7* 8.7* 9.3  MG  --   --  2.3  --   --   --  2.7*  PHOS  --   --  3.8  --   --   --  4.8*    Lorin GlassBinaya Oaklynn Stierwalt, MD  [facility] 05/08/2019

## 2019-05-08 NOTE — Progress Notes (Signed)
Patient ambulated in hallway with stand by staff assistance. Observed patient did not have incentive spirometer at bedside. IS provided and patient achieved 2000 on first attempt. Educated on importance and use 10/hr. Verbalized understanding. Patient placed in chair and educated about walking in hallway this afternoon as well. Will continue to monitor.  Hiram Comber, RN 05/08/2019 10:29 AM

## 2019-05-09 LAB — CBC WITH DIFFERENTIAL/PLATELET
Abs Immature Granulocytes: 0.07 10*3/uL (ref 0.00–0.07)
Basophils Absolute: 0 10*3/uL (ref 0.0–0.1)
Basophils Relative: 0 %
Eosinophils Absolute: 0.1 10*3/uL (ref 0.0–0.5)
Eosinophils Relative: 1 %
HCT: 42.6 % (ref 39.0–52.0)
Hemoglobin: 15 g/dL (ref 13.0–17.0)
Immature Granulocytes: 1 %
Lymphocytes Relative: 19 %
Lymphs Abs: 2 10*3/uL (ref 0.7–4.0)
MCH: 35.4 pg — ABNORMAL HIGH (ref 26.0–34.0)
MCHC: 35.2 g/dL (ref 30.0–36.0)
MCV: 100.5 fL — ABNORMAL HIGH (ref 80.0–100.0)
Monocytes Absolute: 1 10*3/uL (ref 0.1–1.0)
Monocytes Relative: 9 %
Neutro Abs: 7.5 10*3/uL (ref 1.7–7.7)
Neutrophils Relative %: 70 %
Platelets: 179 10*3/uL (ref 150–400)
RBC: 4.24 MIL/uL (ref 4.22–5.81)
RDW: 12.3 % (ref 11.5–15.5)
WBC: 10.6 10*3/uL — ABNORMAL HIGH (ref 4.0–10.5)
nRBC: 0 % (ref 0.0–0.2)

## 2019-05-09 LAB — BASIC METABOLIC PANEL
Anion gap: 13 (ref 5–15)
BUN: 34 mg/dL — ABNORMAL HIGH (ref 8–23)
CO2: 23 mmol/L (ref 22–32)
Calcium: 8.7 mg/dL — ABNORMAL LOW (ref 8.9–10.3)
Chloride: 97 mmol/L — ABNORMAL LOW (ref 98–111)
Creatinine, Ser: 1.53 mg/dL — ABNORMAL HIGH (ref 0.61–1.24)
GFR calc Af Amer: 54 mL/min — ABNORMAL LOW (ref >60)
GFR calc non Af Amer: 47 mL/min — ABNORMAL LOW (ref >60)
Glucose, Bld: 93 mg/dL (ref 70–99)
Potassium: 3.7 mmol/L (ref 3.5–5.1)
Sodium: 133 mmol/L — ABNORMAL LOW (ref 135–145)

## 2019-05-09 LAB — CULTURE, BLOOD (ROUTINE X 2)
Culture: NO GROWTH
Special Requests: ADEQUATE

## 2019-05-09 MED ORDER — METHOCARBAMOL 500 MG PO TABS
500.0000 mg | ORAL_TABLET | Freq: Three times a day (TID) | ORAL | Status: DC
  Administered 2019-05-09 – 2019-05-15 (×19): 500 mg via ORAL
  Filled 2019-05-09 (×19): qty 1

## 2019-05-09 MED ORDER — GABAPENTIN 300 MG PO CAPS
300.0000 mg | ORAL_CAPSULE | Freq: Three times a day (TID) | ORAL | Status: DC
  Administered 2019-05-09 – 2019-05-15 (×19): 300 mg via ORAL
  Filled 2019-05-09 (×9): qty 1
  Filled 2019-05-09: qty 3
  Filled 2019-05-09 (×9): qty 1

## 2019-05-09 MED ORDER — DOCUSATE SODIUM 100 MG PO CAPS
100.0000 mg | ORAL_CAPSULE | Freq: Two times a day (BID) | ORAL | Status: DC
  Administered 2019-05-09 – 2019-05-10 (×4): 100 mg via ORAL
  Filled 2019-05-09 (×12): qty 1

## 2019-05-09 MED ORDER — PANTOPRAZOLE SODIUM 40 MG PO TBEC
40.0000 mg | DELAYED_RELEASE_TABLET | Freq: Every day | ORAL | Status: DC
  Administered 2019-05-09 – 2019-05-15 (×7): 40 mg via ORAL
  Filled 2019-05-09 (×7): qty 1

## 2019-05-09 MED ORDER — POLYETHYLENE GLYCOL 3350 17 G PO PACK
17.0000 g | PACK | Freq: Every day | ORAL | Status: DC
  Administered 2019-05-09: 17 g via ORAL
  Filled 2019-05-09 (×4): qty 1

## 2019-05-09 MED ORDER — BISACODYL 10 MG RE SUPP: 10.0000 mg | Freq: Every day | RECTAL | Status: DC | PRN

## 2019-05-09 MED ORDER — SODIUM CHLORIDE 0.9 % IV SOLN
3.0000 g | Freq: Four times a day (QID) | INTRAVENOUS | Status: DC
  Administered 2019-05-09 – 2019-05-10 (×4): 3 g via INTRAVENOUS
  Filled 2019-05-09: qty 3
  Filled 2019-05-09: qty 8
  Filled 2019-05-09: qty 3
  Filled 2019-05-09: qty 8
  Filled 2019-05-09: qty 3
  Filled 2019-05-09: qty 8

## 2019-05-09 MED ORDER — SODIUM CHLORIDE 0.9 % IV SOLN
12.5000 mg | Freq: Three times a day (TID) | INTRAVENOUS | Status: DC | PRN
  Filled 2019-05-09: qty 0.5

## 2019-05-09 NOTE — Progress Notes (Signed)
Ambulated with patient around unit approximately 200 feet. Patient tolerated well on RA. No c/o distress. spO2 maintaining at 93% on RA.

## 2019-05-09 NOTE — Progress Notes (Signed)
[facility] Progress Note  4 Days Post-Op  Subjective: CC: RUQ pain  Patient with some RUQ pain still, worse with coughing or hiccoughs. Denies nausea but reports some reflux. Passing flatus but no BM yet. Ambulated 4x yesterday. Got a nosebleed from oxygen tubing.   Objective: Vital signs in last 24 hours: Temp:  [97.8 F (36.6 C)-99.6 F (37.6 C)] 99.6 F (37.6 C) (11/23 0802) Pulse Rate:  [81-93] 90 (11/23 0826) Resp:  [11-21] 21 (11/23 0417) BP: (105-117)/(69-91) 111/75 (11/23 0826) SpO2:  [91 %-95 %] 94 % (11/23 0417) Last BM Date: 05/03/19  Intake/Output from previous day: 11/22 0701 - 11/23 0700 In: 1248.4 [P.O.:470; I.V.:628.3; IV Piggyback:150.1] Out: 190 [Drains:190] Intake/Output this shift: No intake/output data recorded.  PE: Gen:  Alert, NAD, pleasant Card:  Regular rate and rhythm Pulm:  Normal effort, clear to auscultation bilaterally Abd: Soft, mildly ttp in RUQ, moderately distended, +BS, incisions c/d/i, drain in RUQ with SS output Skin: warm and dry, no rashes  Psych: A&Ox3   Lab Results:  Recent Labs    05/08/19 0232 05/09/19 0534  WBC 17.0* 10.6*  HGB 16.4 15.0  HCT 46.7 42.6  PLT 244 179   BMET Recent Labs    05/08/19 0232 05/09/19 0534  NA 133* 133*  K 4.1 3.7  CL 98 97*  CO2 22 23  GLUCOSE 117* 93  BUN 46* 34*  CREATININE 1.86* 1.53*  CALCIUM 9.3 8.7*   PT/INR No results for input(s): LABPROT, INR in the last 72 hours. CMP     Component Value Date/Time   NA 133 (L) 05/09/2019 0534   NA 142 07/28/2017 0847   K 3.7 05/09/2019 0534   CL 97 (L) 05/09/2019 0534   CO2 23 05/09/2019 0534   GLUCOSE 93 05/09/2019 0534   BUN 34 (H) 05/09/2019 0534   BUN 17 07/28/2017 0847   CREATININE 1.53 (H) 05/09/2019 0534   CREATININE 1.25 12/24/2015 0827   CALCIUM 8.7 (L) 05/09/2019 0534   PROT 6.4 (L) 05/06/2019 0237   ALBUMIN 3.5 05/06/2019 0237   AST 43 (H) 05/06/2019 0237   ALT 61 (H) 05/06/2019 0237   ALKPHOS 76  05/06/2019 0237   BILITOT 2.3 (H) 05/06/2019 0237   GFRNONAA 47 (L) 05/09/2019 0534   GFRAA 54 (L) 05/09/2019 0534   Lipase     Component Value Date/Time   LIPASE 38 05/03/2019 2040       Studies/Results: Dg Abd Portable 1v  Result Date: 05/07/2019 CLINICAL DATA:  Nausea EXAM: PORTABLE ABDOMEN - 1 VIEW COMPARISON:  CT abdomen pelvis dated 05/03/2019 FINDINGS: Multiple dilated gas-filled loops of small bowel are seen throughout the abdomen. Air-fluid levels and free intraperitoneal air cannot be excluded on the supine exam. Cholecystectomy clips overlie the gallbladder fossa. IMPRESSION: Multiple dilated loops of bowel throughout the abdomen. Air-fluid levels cannot be evaluated on the supine exam and these findings may reflect ileus or small-bowel obstruction. Electronically Signed   By: Zerita Boers M.D.   On: 05/07/2019 11:43    Anti-infectives: Anti-infectives (From admission, onward)   Start     Dose/Rate Route Frequency Ordered Stop   05/04/19 1530  piperacillin-tazobactam (ZOSYN) IVPB 3.375 g     3.375 g 12.5 mL/hr over 240 Minutes Intravenous Every 8 hours 05/04/19 1523     05/04/19 0600  piperacillin-tazobactam (ZOSYN) IVPB 3.375 g  Status:  Discontinued     3.375 g 100 mL/hr over 30 Minutes Intravenous Every 8 hours 05/04/19 0228 05/04/19 1522  05/03/19 2200  piperacillin-tazobactam (ZOSYN) IVPB 3.375 g     3.375 g 100 mL/hr over 30 Minutes Intravenous  Once 05/03/19 2152 05/04/19 0011       Assessment/Plan Hx of STEMI s/p stent to LAD Hx of seizures - has been off antiepileptics for several years with no seizures OA Chronic systolic CHF Hypothyroidism Alcohol abuse with cirrhosis  Tobacco abuse - smokes 3-4 cigarettes daily  Acute cholecystitis S/P laparoscopic cholecystectomy and drain placement, Dr. Magnus Ivan, 11/19 - POD#4 - mild post-op ileus, passing flatus and denies nausea - ok to have FLD - needs to mobilize more - added bowel regimen - drain  with 190 cc out, SS - may need [place] to assess for ascites related to cirrhosis - scheduled robaxin and gabapentin to aid in pain control   FEN: FLD VTE: SCD's, lovenox ID: [email or web address] 11/17>> Foley: none Follow up: [facility]   LOS: 6 days    Wells Guiles , [facility] 05/09/2019, 8:32 AM Please see Amion for pager number during day hours 7:00am-4:30pm

## 2019-05-09 NOTE — Progress Notes (Signed)
PROGRESS NOTE  Randy Beasley  DOB: 02-17-1956  PCP: Pearson ForsterKelly, William S, MD ZOX:[id number]  DOA: 05/03/2019  LOS: 6 days   Chief Complaint  Patient presents with  . Chest Pain   Brief narrative: Randy Beasley is a 63 y.o. male with PMH of HTN, CAD, MI s/p stent, CHF, ischemic cardiomyopathy EF 25 to 30%, hypothyroidism, seizures, hepatic steatosis. Patient presented to the ED on 05/03/2019 with complaint of abdominal pain radiating to the chest started after dinner, seated with nausea and bilious emesis.  In the ED, patient was afebrile and hemodynamically stable.  He had tenderness in the right upper quadrant. Work-up showed WBC 11.7, potassium 3.8, BUN/creatinine 19/1.91, AST/ALT/bilirubin elevated to 109/127/2.1  CT angio of the chest and abdomen showed: 1. Findings suspicious of acute cholecystitis which include abnormal gallbladder with stones/sludge, evidence of pericholecystic inflammation, and superimposed pneumobilia. 2. Possible hepatic Cirrhosis, but no stigmata of portal venous hypertension. Extensive calcified coronary artery atherosclerosis and/or stent.   Patient was admitted under hospitalist medicine service for acute cholecystitis.  IV Zosyn was started.  General surgery as well as cardiology consultation was obtained.  Subjective: Patient was seen and examined this afternoon.  Not on oxygen supplementation today.  Abdomen remains distended.  Passing gas.  No bowel movement yet.  On clear liquid diet. Creatinine improving.  Pain controlled on pain medicines.  Assessment/Plan: Acute cholecystitis  -Presented with abdominal pain, nausea, vomiting.  Positive HIDA scan -11/19, patient underwent laparoscopic cholecystectomy and drain placement by Dr. Magnus IvanBlackman. -Currently in postoperative ileus.  Bowel sounds present.  Passing some gas.  No bowel movement yet. -Switch antibiotic to IV Unasyn today.   -Await further return of bowel function. -Encourage incentive  spirometry, ambulation. 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 No evidence of portal hypertension at this time. -Likely related to hepatic steatosis as well as alcohol use. -Liver enzymes trending down -Patient will follow-up with GI as an outpatient.  Acute kidney injury on CKD stage 3a -Baseline creatinine 1.3, CKD stage 3a -Creatinine creatinine trending up, to peak at 1.86 on 11/22.  Lasix on hold.  History of systolic congestive heart failure -Echocardiogram 11/18 showed EF of 40 to 45% which is an improvement compared to previous. -Does not seem decompensated at this time.   -Home meds include Lasix, metoprolol, ramipril  Coronary artery disease/history of NSTEMI status post PCI: No chest pain. Cardiac enzymes negative. On aspirin 81 mg daily at home.  Hypothyroidism -continue Synthroid   Depression - continue BuSpar.    Mobility: Encourage ambulation DVT prophylaxis:  Subcu Lovenox Code Status:   Code Status: Full Code  Family Communication:  Not at bedside Expected Discharge:  Pending return of bowel function.  Anticipate 2-3 more days of hospital stay.  Consultants:  Cardiology, general surgery  Procedures:  None  Antimicrobials: Anti-infectives (From admission, onward)   Start     Dose/Rate Route Frequency Ordered Stop   05/09/19 1600  Ampicillin-Sulbactam (UNASYN) 3 g in sodium chloride 0.9 % 100 mL IVPB     3 g 200 mL/hr over 30 Minutes Intravenous Every 6 hours 05/09/19 1237     05/04/19 1530  piperacillin-tazobactam (ZOSYN) IVPB 3.375 g  Status:  Discontinued     3.375 g 12.5 mL/hr over 240 Minutes Intravenous Every 8 hours 05/04/19 1523 05/09/19 1237   05/04/19  0600  piperacillin-tazobactam (ZOSYN) IVPB 3.375 g  Status:  Discontinued     3.375 g 100 mL/hr over 30 Minutes Intravenous  Every 8 hours 05/04/19 0228 05/04/19 1522   05/03/19 2200  piperacillin-tazobactam (ZOSYN) IVPB 3.375 g     3.375 g 100 mL/hr over 30 Minutes Intravenous  Once 05/03/19 2152 05/04/19 0011      Diet Order            Diet full liquid Room service appropriate? Yes; Fluid consistency: Thin  Diet effective now              Infusions:  . ampicillin-sulbactam (UNASYN) IV    . chlorproMAZINE (THORAZINE) IV    . lactated ringers 100 mL/hr at 05/09/19 0121    Scheduled Meds: . aspirin  81 mg Oral Daily  . cholecalciferol  1,000 Units Oral Daily  . docusate sodium  100 mg Oral BID  . enoxaparin (LOVENOX) injection  40 mg Subcutaneous Q24H  . folic acid  1 mg Oral Daily  . gabapentin  300 mg Oral TID  . levothyroxine  125 mcg Oral Q0600  . methocarbamol  500 mg Oral TID  . metoCLOPramide (REGLAN) injection  10 mg Intravenous Q6H  . metoprolol tartrate  12.5 mg Oral BID  . multivitamin with minerals  1 tablet Oral Daily  . pantoprazole  40 mg Oral Daily  . polyethylene glycol  17 g Oral Daily  . thiamine  100 mg Oral Daily   Or  . thiamine  100 mg Intravenous Daily    PRN meds: bisacodyl, chlorproMAZINE (THORAZINE) IV, HYDROmorphone (DILAUDID) injection, LORazepam **OR** LORazepam, ondansetron (ZOFRAN) IV, oxyCODONE   Objective: Vitals:   05/09/19 0826 05/09/19 1211  BP: 111/75   Pulse: 90   Resp:    Temp:  98.9 F (37.2 C)  SpO2:      Intake/Output Summary (Last 24 hours) at 05/09/2019 1508 Last data filed at 05/09/2019 1300 Gross per 24 hour  Intake 240 ml  Output 75 ml  Net 165 ml   Filed Weights   05/04/19 1530 05/04/19 1548 05/05/19 0311  Weight: 109.6 kg 108.6 kg 109.4 kg   Weight change:  Body mass index is 33.64 kg/m.   Physical Exam:  General exam: Not in physical distress today.  Pain controlled with pain medicines. Skin: No rashes, lesions or ulcers. HEENT: Atraumatic, normocephalic, supple neck, no obvious bleeding Lungs: Diminished air entry  in both bases. CVS: Regular rate and rhythm, no murmur GI/Abd: Distended, tympanic, bowel sound present. JP drain on the right upper quadrant draining serosanguineous fluid.  Less output than yesterday. CNS: Alert, awake, oriented x3 Psychiatry: frustrated with persistent nausea. Extremities: No pedal edema, no calf tenderness  Data Review: I have personally reviewed the laboratory data and studies available.  Recent Labs  Lab 05/04/19 0330 05/05/19 0325 05/06/19 0237 05/08/19 0232 05/09/19 0534  WBC 13.3* 19.3* 15.7* 17.0* 10.6*  NEUTROABS  --   --   --  13.6* 7.5  HGB 16.1 15.3 14.6 16.4 15.0  HCT 48.7 45.2 41.8 46.7 42.6  MCV 104.5* 103.0* 101.2* 99.8 100.5*  PLT 171 141* 133* 244 179   Recent Labs  Lab 05/04/19 0012 05/04/19 0330 05/05/19 0325 05/06/19 0237 05/08/19 0232 05/09/19 0534  NA  --  138 133* 132* 133* 133*  K  --  4.2 4.3 4.5 4.1 3.7  CL  --  104 101 99 98 97*  CO2  --  17* 21* 20* 22 23  GLUCOSE  --  105* 104* 132* 117* 93  BUN  --  20 24* 30* 46* 34*  CREATININE 1.66* 1.54* 1.75* 1.67* 1.86* 1.53*  CALCIUM  --  9.4 8.7* 8.7* 9.3 8.7*  MG 2.3  --   --   --  2.7*  --   PHOS 3.8  --   --   --  4.8*  --     Lorin Glass, MD  [facility] 05/09/2019

## 2019-05-10 LAB — COMPREHENSIVE METABOLIC PANEL
ALT: 120 U/L — ABNORMAL HIGH (ref 0–44)
AST: 90 U/L — ABNORMAL HIGH (ref 15–41)
Albumin: 2.5 g/dL — ABNORMAL LOW (ref 3.5–5.0)
Alkaline Phosphatase: 67 U/L (ref 38–126)
Anion gap: 10 (ref 5–15)
BUN: 23 mg/dL (ref 8–23)
CO2: 25 mmol/L (ref 22–32)
Calcium: 8.4 mg/dL — ABNORMAL LOW (ref 8.9–10.3)
Chloride: 99 mmol/L (ref 98–111)
Creatinine, Ser: 1.24 mg/dL (ref 0.61–1.24)
GFR calc Af Amer: >60 mL/min (ref >60)
GFR calc non Af Amer: >60 mL/min (ref >60)
Glucose, Bld: 82 mg/dL (ref 70–99)
Potassium: 3.4 mmol/L — ABNORMAL LOW (ref 3.5–5.1)
Sodium: 134 mmol/L — ABNORMAL LOW (ref 135–145)
Total Bilirubin: 1.8 mg/dL — ABNORMAL HIGH (ref 0.3–1.2)
Total Protein: 5.2 g/dL — ABNORMAL LOW (ref 6.5–8.1)

## 2019-05-10 LAB — CBC WITH DIFFERENTIAL/PLATELET
Abs Immature Granulocytes: 0.05 10*3/uL (ref 0.00–0.07)
Basophils Absolute: 0 10*3/uL (ref 0.0–0.1)
Basophils Relative: 0 %
Eosinophils Absolute: 0.2 10*3/uL (ref 0.0–0.5)
Eosinophils Relative: 3 %
HCT: 39 % (ref 39.0–52.0)
Hemoglobin: 13.7 g/dL (ref 13.0–17.0)
Immature Granulocytes: 1 %
Lymphocytes Relative: 24 %
Lymphs Abs: 2 10*3/uL (ref 0.7–4.0)
MCH: 35.2 pg — ABNORMAL HIGH (ref 26.0–34.0)
MCHC: 35.1 g/dL (ref 30.0–36.0)
MCV: 100.3 fL — ABNORMAL HIGH (ref 80.0–100.0)
Monocytes Absolute: 0.8 10*3/uL (ref 0.1–1.0)
Monocytes Relative: 10 %
Neutro Abs: 5.2 10*3/uL (ref 1.7–7.7)
Neutrophils Relative %: 62 %
Platelets: 153 10*3/uL (ref 150–400)
RBC: 3.89 MIL/uL — ABNORMAL LOW (ref 4.22–5.81)
RDW: 12.3 % (ref 11.5–15.5)
WBC: 8.3 10*3/uL (ref 4.0–10.5)
nRBC: 0 % (ref 0.0–0.2)

## 2019-05-10 MED ORDER — POTASSIUM CHLORIDE CRYS ER 20 MEQ PO TBCR
40.0000 meq | EXTENDED_RELEASE_TABLET | Freq: Two times a day (BID) | ORAL | Status: AC
  Administered 2019-05-10 (×2): 40 meq via ORAL
  Filled 2019-05-10 (×2): qty 2

## 2019-05-10 NOTE — Progress Notes (Signed)
Pt ambulated around unit estimated 300 feet. Patient tolerated well with spO2 maintained at 92-93% RA. Pt placed in chair.

## 2019-05-10 NOTE — Progress Notes (Signed)
PROGRESS NOTE  Randy Beasley  DOB: 01-17-1956  PCP: Pearson Forster, MD AST:[id number]  DOA: 05/03/2019  LOS: 7 days   Chief Complaint  Patient presents with  . Chest Pain   Brief narrative: Randy Beasley is a 63 y.o. male with PMH of HTN, CAD, MI s/p stent, CHF, ischemic cardiomyopathy EF 25 to 30%, hypothyroidism, seizures, hepatic steatosis. Patient presented to the ED on 05/03/2019 with complaint of abdominal pain radiating to the chest started after dinner, seated with nausea and bilious emesis.  In the ED, patient was afebrile and hemodynamically stable.  He had tenderness in the right upper quadrant. Work-up showed WBC 11.7, potassium 3.8, BUN/creatinine 19/1.91, AST/ALT/bilirubin elevated to 109/127/2.1  CT angio of the chest and abdomen showed: 1. Findings suspicious of acute cholecystitis which include abnormal gallbladder with stones/sludge, evidence of pericholecystic inflammation, and superimposed pneumobilia. 2. Possible hepatic Cirrhosis, but no stigmata of portal venous hypertension. Extensive calcified coronary artery atherosclerosis and/or stent.   Patient was admitted under hospitalist medicine service for acute cholecystitis.  IV Zosyn was started.  General surgery as well as cardiology consultation was obtained.  Subjective: Patient was seen and examined this afternoon.  Had a BM this morning.  Feels better than before.  Tolerating full liquid diet.  Ambulating in the hallway.  Assessment/Plan: Acute cholecystitis  -Presented with abdominal pain, nausea, vomiting.  Positive HIDA scan -11/19, patient underwent laparoscopic cholecystectomy and drain placement by Dr. Magnus Ivan. -Currently in postoperative ileus.  Seems to be slowly resolving.   -Currently on IV Unasyn today.   -Await further return of bowel function. -Encourage incentive spirometry, ambulation. 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 No evidence of portal hypertension at this time. -Likely related to hepatic steatosis as well as alcohol use. -Liver enzymes trending down.  Noted slightly up today.  Follow-up CMP tomorrow. -Patient will follow-up with GI as an outpatient.  Acute kidney injury on CKD stage 3a -Baseline creatinine 1.3, CKD stage 3a -Creatinine peaked at 1.86 on 11/22.  Improved to normal now.  Lasix on hold.  History of systolic congestive heart failure -Echocardiogram 11/18 showed EF of 40 to 45% which is an improvement compared to previous. -Does not seem decompensated at this time.   -Home meds include Lasix, metoprolol, ramipril.  Continue metoprolol.  Lasix and ramipril on hold.  Coronary artery disease/history of NSTEMI status post PCI. -No chest pain. -Cardiac enzymes negative. -On aspirin 81 mg daily at home.  Hypothyroidism -continue Synthroid   Depression - continue BuSpar.    Mobility: Encourage ambulation DVT prophylaxis:  Subcu Lovenox Code Status:   Code Status: Full Code  Family Communication:  Not at bedside Expected Discharge:  Resolving postop 5 days.  Consultants:  Cardiology, general surgery  Procedures:  None  Antimicrobials: Anti-infectives (From admission, onward)   Start     Dose/Rate Route Frequency Ordered Stop   05/09/19 1600  Ampicillin-Sulbactam (UNASYN) 3 g in sodium chloride 0.9 % 100 mL IVPB  Status:  Discontinued     3 g 200 mL/hr over 30 Minutes Intravenous Every 6 hours 05/09/19 1237 05/10/19 1142   05/04/19 1530  piperacillin-tazobactam (ZOSYN) IVPB 3.375 g  Status:  Discontinued     3.375 g 12.5 mL/hr over 240 Minutes Intravenous Every 8 hours 05/04/19 1523 05/09/19 1237   05/04/19 0600  piperacillin-tazobactam (ZOSYN) IVPB  3.375 g  Status:  Discontinued     3.375 g 100 mL/hr over 30 Minutes Intravenous Every 8 hours 05/04/19 0228 05/04/19  1522   05/03/19 2200  piperacillin-tazobactam (ZOSYN) IVPB 3.375 g     3.375 g 100 mL/hr over 30 Minutes Intravenous  Once 05/03/19 2152 05/04/19 0011      Diet Order            DIET SOFT Room service appropriate? Yes; Fluid consistency: Thin  Diet effective now              Infusions:  . chlorproMAZINE (THORAZINE) IV    . lactated ringers 100 mL/hr at 05/10/19 0914    Scheduled Meds: . aspirin  81 mg Oral Daily  . cholecalciferol  1,000 Units Oral Daily  . docusate sodium  100 mg Oral BID  . enoxaparin (LOVENOX) injection  40 mg Subcutaneous Q24H  . folic acid  1 mg Oral Daily  . gabapentin  300 mg Oral TID  . levothyroxine  125 mcg Oral Q0600  . methocarbamol  500 mg Oral TID  . metoCLOPramide (REGLAN) injection  10 mg Intravenous Q6H  . metoprolol tartrate  12.5 mg Oral BID  . multivitamin with minerals  1 tablet Oral Daily  . pantoprazole  40 mg Oral Daily  . polyethylene glycol  17 g Oral Daily  . potassium chloride  40 mEq Oral BID  . thiamine  100 mg Oral Daily   Or  . thiamine  100 mg Intravenous Daily    PRN meds: bisacodyl, chlorproMAZINE (THORAZINE) IV, HYDROmorphone (DILAUDID) injection, ondansetron (ZOFRAN) IV, oxyCODONE   Objective: Vitals:   05/10/19 0856 05/10/19 1145  BP: 127/85 117/81  Pulse: 73 76  Resp: 15 18  Temp: 98.2 F (36.8 C) 98.2 F (36.8 C)  SpO2: 94% 94%    Intake/Output Summary (Last 24 hours) at 05/10/2019 1520 Last data filed at 05/10/2019 1218 Gross per 24 hour  Intake 2364.05 ml  Output 50 ml  Net 2314.05 ml   Filed Weights   05/04/19 1530 05/04/19 1548 05/05/19 0311  Weight: 109.6 kg 108.6 kg 109.4 kg   Weight change:  Body mass index is 33.64 kg/m.   Physical Exam:  General exam: Not in physical distress today.  Pain controlled with meds Skin: No rashes, lesions or ulcers. HEENT: Atraumatic, normocephalic, supple neck, no obvious bleeding Lungs: Clear to auscultation bilaterally CVS: Regular rate and  rhythm, no murmur GI/Abd: Soft, less tender, bowel sound present CNS: Alert, awake, oriented x3 Psychiatry: Mood appropriate Extremities: No pedal edema, no calf tenderness  Data Review: I have personally reviewed the laboratory data and studies available.  Recent Labs  Lab 05/05/19 0325 05/06/19 0237 05/08/19 0232 05/09/19 0534 05/10/19 0324  WBC 19.3* 15.7* 17.0* 10.6* 8.3  NEUTROABS  --   --  13.6* 7.5 5.2  HGB 15.3 14.6 16.4 15.0 13.7  HCT 45.2 41.8 46.7 42.6 39.0  MCV 103.0* 101.2* 99.8 100.5* 100.3*  PLT 141* 133* 244 179 153   Recent Labs  Lab 05/04/19 0012  05/05/19 0325 05/06/19 0237 05/08/19 0232 05/09/19 0534 05/10/19 0324  NA  --    < > 133* 132* 133* 133* 134*  K  --    < > 4.3 4.5 4.1 3.7 3.4*  CL  --    < > 101 99 98 97* 99  CO2  --    < > 21* 20* 22 23 25   GLUCOSE  --    < >  104* 132* 117* 93 82  BUN  --    < > 24* 30* 46* 34* 23  CREATININE 1.66*   < > 1.75* 1.67* 1.86* 1.53* 1.24  CALCIUM  --    < > 8.7* 8.7* 9.3 8.7* 8.4*  MG 2.3  --   --   --  2.7*  --   --   PHOS 3.8  --   --   --  4.8*  --   --    < > = values in this interval not displayed.    Lorin GlassBinaya Dyson Sevey, MD  [facility] 05/10/2019

## 2019-05-10 NOTE — Progress Notes (Signed)
Pt ambulated around unit approximately 275 feet. Tolerated well with spO2 at 93-94%. Pt placed back in bed.

## 2019-05-10 NOTE — Progress Notes (Addendum)
[facility] Progress Note  5 Days Post-Op  Subjective: CC-  "I took a huge dump this morning."  States that he does still feel bloated but has had no n/v. Hiccups resolved. Tolerating full liquids. About to get OOB and walk in the halls. Has not taken any narcotic pain medication since yesterday afternoon.  Objective: Vital signs in last 24 hours: Temp:  [98.2 F (36.8 C)-99.6 F (37.6 C)] 98.2 F (36.8 C) (11/24 0856) Pulse Rate:  [64-74] 73 (11/24 0856) Resp:  [15-18] 15 (11/24 0856) BP: (107-127)/(75-85) 127/85 (11/24 0856) SpO2:  [89 %-94 %] 94 % (11/24 0856) Last BM Date: 05/03/19  Intake/Output from previous day: 11/23 0701 - 11/24 0700 In: 2064.1 [P.O.:360; I.V.:1604.1; IV Piggyback:100] Out: 25 [Drains:25] Intake/Output this shift: Total I/O In: -  Out: 25 [Drains:25]  PE: Gen:  Alert, NAD, pleasant Card:  RRRR Pulm:  rate and effort normal Abd: Soft, moderately distended, mild RUQ TTP, some normal some tinkling BS, incisions c/d/i, drain in RUQ with trace SS output Skin: warm and dry  Lab Results:  Recent Labs    05/09/19 0534 05/10/19 0324  WBC 10.6* 8.3  HGB 15.0 13.7  HCT 42.6 39.0  PLT 179 153   BMET Recent Labs    05/09/19 0534 05/10/19 0324  NA 133* 134*  K 3.7 3.4*  CL 97* 99  CO2 23 25  GLUCOSE 93 82  BUN 34* 23  CREATININE 1.53* 1.24  CALCIUM 8.7* 8.4*   PT/INR No results for input(s): LABPROT, INR in the last 72 hours. CMP     Component Value Date/Time   NA 134 (L) 05/10/2019 0324   NA 142 07/28/2017 0847   K 3.4 (L) 05/10/2019 0324   CL 99 05/10/2019 0324   CO2 25 05/10/2019 0324   GLUCOSE 82 05/10/2019 0324   BUN 23 05/10/2019 0324   BUN 17 07/28/2017 0847   CREATININE 1.24 05/10/2019 0324   CREATININE 1.25 12/24/2015 0827   CALCIUM 8.4 (L) 05/10/2019 0324   PROT 5.2 (L) 05/10/2019 0324   ALBUMIN 2.5 (L) 05/10/2019 0324   AST 90 (H) 05/10/2019 0324   ALT 120 (H) 05/10/2019 0324   ALKPHOS 67 05/10/2019  0324   BILITOT 1.8 (H) 05/10/2019 0324   GFRNONAA >60 05/10/2019 0324   GFRAA >60 05/10/2019 0324   Lipase     Component Value Date/Time   LIPASE 38 05/03/2019 2040       Studies/Results: No results found.  Anti-infectives: Anti-infectives (From admission, onward)   Start     Dose/Rate Route Frequency Ordered Stop   05/09/19 1600  Ampicillin-Sulbactam (UNASYN) 3 g in sodium chloride 0.9 % 100 mL IVPB     3 g 200 mL/hr over 30 Minutes Intravenous Every 6 hours 05/09/19 1237     05/04/19 1530  piperacillin-tazobactam (ZOSYN) IVPB 3.375 g  Status:  Discontinued     3.375 g 12.5 mL/hr over 240 Minutes Intravenous Every 8 hours 05/04/19 1523 05/09/19 1237   05/04/19 0600  piperacillin-tazobactam (ZOSYN) IVPB 3.375 g  Status:  Discontinued     3.375 g 100 mL/hr over 30 Minutes Intravenous Every 8 hours 05/04/19 0228 05/04/19 1522   05/03/19 2200  piperacillin-tazobactam (ZOSYN) IVPB 3.375 g     3.375 g 100 mL/hr over 30 Minutes Intravenous  Once 05/03/19 2152 05/04/19 0011       Assessment/Plan Hx of STEMI s/p stent to LAD Hx of seizures - has been off antiepileptics for several years with  no seizures OA Chronic systolic CHF Hypothyroidism Alcohol abuse with cirrhosis  Tobacco abuse - smokes 3-4 cigarettes daily  Acute cholecystitis S/P laparoscopic cholecystectomy and drain placement, Dr. Ninfa Linden, 11/19 - POD#5 - post-op ileus seems to be slowly resolving - advance to soft diet, continue bowel regimen, replace K - mobilize - monitor drain output, remains SS and output decreasing in volume  FEN: soft diet VTE: SCD's, lovenox RC:VELFY 11/17>>11/23, unasyn 11/23>>11/24 Foley:none Follow up:[facility]   LOS: 7 days    Wellington Hampshire, [facility] 05/10/2019, 10:30 AM Please see Amion for pager number during day hours 7:00am-4:30pm

## 2019-05-11 ENCOUNTER — Inpatient Hospital Stay (HOSPITAL_COMMUNITY): Payer: Medicare HMO

## 2019-05-11 LAB — COMPREHENSIVE METABOLIC PANEL
ALT: 159 U/L — ABNORMAL HIGH (ref 0–44)
AST: 116 U/L — ABNORMAL HIGH (ref 15–41)
Albumin: 2.5 g/dL — ABNORMAL LOW (ref 3.5–5.0)
Alkaline Phosphatase: 80 U/L (ref 38–126)
Anion gap: 9 (ref 5–15)
BUN: 14 mg/dL (ref 8–23)
CO2: 23 mmol/L (ref 22–32)
Calcium: 8 mg/dL — ABNORMAL LOW (ref 8.9–10.3)
Chloride: 101 mmol/L (ref 98–111)
Creatinine, Ser: 1.15 mg/dL (ref 0.61–1.24)
GFR calc Af Amer: >60 mL/min (ref >60)
GFR calc non Af Amer: >60 mL/min (ref >60)
Glucose, Bld: 82 mg/dL (ref 70–99)
Potassium: 4.3 mmol/L (ref 3.5–5.1)
Sodium: 133 mmol/L — ABNORMAL LOW (ref 135–145)
Total Bilirubin: 1.9 mg/dL — ABNORMAL HIGH (ref 0.3–1.2)
Total Protein: 5.1 g/dL — ABNORMAL LOW (ref 6.5–8.1)

## 2019-05-11 LAB — CBC WITH DIFFERENTIAL/PLATELET
Abs Immature Granulocytes: 0.06 10*3/uL (ref 0.00–0.07)
Basophils Absolute: 0 10*3/uL (ref 0.0–0.1)
Basophils Relative: 0 %
Eosinophils Absolute: 0.3 10*3/uL (ref 0.0–0.5)
Eosinophils Relative: 3 %
HCT: 37.8 % — ABNORMAL LOW (ref 39.0–52.0)
Hemoglobin: 13.3 g/dL (ref 13.0–17.0)
Immature Granulocytes: 1 %
Lymphocytes Relative: 22 %
Lymphs Abs: 1.6 10*3/uL (ref 0.7–4.0)
MCH: 35.5 pg — ABNORMAL HIGH (ref 26.0–34.0)
MCHC: 35.2 g/dL (ref 30.0–36.0)
MCV: 100.8 fL — ABNORMAL HIGH (ref 80.0–100.0)
Monocytes Absolute: 0.6 10*3/uL (ref 0.1–1.0)
Monocytes Relative: 8 %
Neutro Abs: 4.9 10*3/uL (ref 1.7–7.7)
Neutrophils Relative %: 66 %
Platelets: 167 10*3/uL (ref 150–400)
RBC: 3.75 MIL/uL — ABNORMAL LOW (ref 4.22–5.81)
RDW: 12.3 % (ref 11.5–15.5)
WBC: 7.4 10*3/uL (ref 4.0–10.5)
nRBC: 0 % (ref 0.0–0.2)

## 2019-05-11 NOTE — Discharge Instructions (Signed)
[facility]: POST OP INSTRUCTIONS Always review your discharge instruction sheet given to you by the facility where your surgery was performed. IF YOU HAVE DISABILITY OR FAMILY LEAVE FORMS, YOU MUST BRING THEM TO THE OFFICE FOR PROCESSING.   DO NOT GIVE THEM TO YOUR DOCTOR.  PAIN CONTROL  1. First take acetaminophen (Tylenol) AND/or ibuprofen (Advil) to control your pain after surgery.  Follow directions on package.  Taking acetaminophen (Tylenol) and/or ibuprofen (Advil) regularly after surgery will help to control your pain and lower the amount of prescription pain medication you may need.  You should not take more than 3,000 mg (3 grams) of acetaminophen (Tylenol) in 24 hours.  You should not take ibuprofen (Advil), aleve, motrin, naprosyn or other NSAIDS if you have a history of stomach ulcers or chronic kidney disease.  2. A prescription for pain medication may be given to you upon discharge.  Take your pain medication as prescribed, if you still have uncontrolled pain after taking acetaminophen (Tylenol) or ibuprofen (Advil). 3. Use ice packs to help control pain. 4. If you need a refill on your pain medication, please contact your pharmacy.  They will contact our office to request authorization. Prescriptions will not be filled after 5pm or on week-ends.  HOME MEDICATIONS 5. Take your usually prescribed medications unless otherwise directed.  DIET 6. You should follow a light diet the first few days after arrival home.  Be sure to include lots of fluids daily. Avoid fatty, fried foods.   CONSTIPATION 7. It is common to experience some constipation after surgery and if you are taking pain medication.  Increasing fluid intake and taking a stool softener (such as Colace) will usually help or prevent this problem from occurring.  A mild laxative (Milk of Magnesia or Miralax) should be taken according to package instructions if there are no bowel  movements after 48 hours.  WOUND/INCISION CARE 8. Most patients will experience some swelling and bruising in the area of the incisions.  Ice packs will help.  Swelling and bruising can take several days to resolve.  9. Unless discharge instructions indicate otherwise, follow guidelines below  a. STERI-STRIPS - you may remove your outer bandages 48 hours after surgery, and you may shower at that time.  You have steri-strips (small skin tapes) in place directly over the incision.  These strips should be left on the skin for 7-10 days.   b. DERMABOND/SKIN GLUE - you may shower in 24 hours.  The glue will flake off over the next 2-3 weeks. 10. Any sutures or staples will be removed at the office during your follow-up visit.  ACTIVITIES 11. You may resume regular (light) daily activities beginning the next day--such as daily self-care, walking, climbing stairs--gradually increasing activities as tolerated.  You may have sexual intercourse when it is comfortable.  Refrain from any heavy lifting or straining until approved by your doctor. a. You may drive when you are no longer taking prescription pain medication, you can comfortably wear a seatbelt, and you can safely maneuver your car and apply brakes.  FOLLOW-UP 12. You should see your doctor in the office for a follow-up appointment approximately 2-3 weeks after your surgery.  You should have been given your post-op/follow-up appointment when your surgery was scheduled.  If you did not receive a post-op/follow-up appointment, make sure that you call for this appointment within a day or two after you arrive home to insure a convenient appointment time.  WHEN TO CALL YOUR DOCTOR: 1. Fever over 101.0 2. Inability to urinate 3. Continued bleeding from incision. 4. Increased pain, redness, or drainage from the incision. 5. Increasing abdominal pain  The clinic staff is available to answer your questions during regular business hours.  Please dont  hesitate to call and ask to speak to one of the nurses for clinical concerns.  If you have a medical emergency, go to the nearest emergency room or call 911.  A surgeon from  [facility] is always on call at the hospital. [address], [address], [place], [place]  [address] ? [address] [address], [place], [place]   [address] [phone] ? [phone] ? FAX [phone] Web site: [email or web address]    Managing Your Pain After Surgery Without Opioids    Thank you for participating in our program to help patients manage their pain after surgery without opioids. This is part of our effort to provide you with the best care possible, without exposing you or your family to the risk that opioids pose.  What pain can I expect after surgery? You can expect to have some pain after surgery. This is normal. The pain is typically worse the day after surgery, and quickly begins to get better. Many studies have found that many patients are able to manage their pain after surgery with Over-the-Counter (OTC) medications such as Tylenol and Motrin. If you have a condition that does not allow you to take Tylenol or Motrin, notify your surgical team.  How will I manage my pain? The best strategy for controlling your pain after surgery is around the clock pain control with Tylenol (acetaminophen) and Motrin (ibuprofen or Advil). Alternating these medications with each other allows you to maximize your pain control. In addition to Tylenol and Motrin, you can use heating pads or ice packs on your incisions to help reduce your pain.  How will I alternate your regular strength over-the-counter pain medication? You will take a dose of pain medication every three hours. ; Start by taking 650 mg of Tylenol (2 pills of 325 mg) ; 3 hours later take 600 mg of Motrin (3 pills of 200 mg) ; 3 hours after taking the Motrin take 650 mg of Tylenol ; 3 hours after that take 600 mg of  Motrin.   - 1 -  See example - if your first dose of Tylenol is at 12:00 PM   12:00 PM Tylenol 650 mg (2 pills of 325 mg)  3:00 PM Motrin 600 mg (3 pills of 200 mg)  6:00 PM Tylenol 650 mg (2 pills of 325 mg)  9:00 PM Motrin 600 mg (3 pills of 200 mg)  Continue alternating every 3 hours   We recommend that you follow this schedule around-the-clock for at least 3 days after surgery, or until you feel that it is no longer needed. Use the table on the last page of this handout to keep track of the medications you are taking. Important: Do not take more than  of Tylenol or  of Motrin in a 24-hour period. Do not take ibuprofen/Motrin if you have a history of bleeding stomach ulcers, severe kidney disease, &/or actively taking a blood thinner  What if I still have pain? If you have pain that is not controlled with the over-the-counter pain medications (Tylenol and Motrin or Advil) you might have what we call breakthrough pain. You will receive a prescription for a small amount of an opioid pain medication such as Oxycodone,  Tramadol, or Tylenol with Codeine. Use these opioid pills in the first 24 hours after surgery if you have breakthrough pain. Do not take more than 1 pill every 4-6 hours.  If you still have uncontrolled pain after using all opioid pills, don't hesitate to call our staff using the number provided. We will help make sure you are managing your pain in the best way possible, and if necessary, we can provide a prescription for additional pain medication.   Day 1    Time  Name of Medication Number of pills taken  Amount of Acetaminophen  Pain Level   Comments  AM PM       AM PM       AM PM       AM PM       AM PM       AM PM       AM PM       AM PM       Total Daily amount of Acetaminophen Do not take more than  3,000 mg per day      Day 2    Time  Name of Medication Number of pills taken  Amount of Acetaminophen  Pain Level   Comments  AM  PM       AM PM       AM PM       AM PM       AM PM       AM PM       AM PM       AM PM       Total Daily amount of Acetaminophen Do not take more than  3,000 mg per day      Day 3    Time  Name of Medication Number of pills taken  Amount of Acetaminophen  Pain Level   Comments  AM PM       AM PM       AM PM       AM PM          AM PM       AM PM       AM PM       AM PM       Total Daily amount of Acetaminophen Do not take more than  3,000 mg per day      Day 4    Time  Name of Medication Number of pills taken  Amount of Acetaminophen  Pain Level   Comments  AM PM       AM PM       AM PM       AM PM       AM PM       AM PM       AM PM       AM PM       Total Daily amount of Acetaminophen Do not take more than  3,000 mg per day      Day 5    Time  Name of Medication Number of pills taken  Amount of Acetaminophen  Pain Level   Comments  AM PM       AM PM       AM PM       AM PM       AM PM       AM PM       AM PM  AM PM       Total Daily amount of Acetaminophen Do not take more than  3,000 mg per day       Day 6    Time  Name of Medication Number of pills taken  Amount of Acetaminophen  Pain Level  Comments  AM PM       AM PM       AM PM       AM PM       AM PM       AM PM       AM PM       AM PM       Total Daily amount of Acetaminophen Do not take more than  3,000 mg per day      Day 7    Time  Name of Medication Number of pills taken  Amount of Acetaminophen  Pain Level   Comments  AM PM       AM PM       AM PM       AM PM       AM PM       AM PM       AM PM       AM PM       Total Daily amount of Acetaminophen Do not take more than  3,000 mg per day        For additional information about how and where to safely dispose of unused opioid medications - [employer]https://www.morepowerfulnc.org  Disclaimer: This document contains information and/or instructional materials adapted from [facility]  for the typical patient with your condition. It does not replace medical advice from your health care provider because your experience may differ from that of the typical patient. Talk to your health care provider if you have any questions about this document, your condition or your treatment plan. Adapted from [facility]    [facility] Surgical drains are used to remove extra fluid that normally builds up in a surgical wound after surgery. A surgical drain helps to heal a surgical wound. Different kinds of surgical drains include:  Active drains. These drains use suction to pull drainage away from the surgical wound. Drainage flows through a tube to a container outside of the body. With these drains, you need to keep the bulb or the drainage container flat (compressed) at all times, except while you empty it. Flattening the bulb or container creates suction.  Passive drains. These drains allow fluid to drain naturally, by gravity. Drainage flows through a tube to a bandage (dressing) or a container outside of the body. Passive drains do not need to be emptied. A drain is placed during surgery. Right after surgery, drainage is usually bright red and a little thicker than water. The drainage may gradually turn yellow or pink and become thinner. It is likely that your health care provider will remove the drain when the drainage stops or when the amount decreases to 1-2 Tbsp (15-30 mL) during a 24-hour period. Supplies needed:  Tape.  Germ-free cleaning solution (sterile saline).  Cotton swabs.  Split gauze drain sponge: 4 x 4 inches (10 x 10 cm).  Gauze square: 4 x 4 inches (10 x 10 cm). How to care for your surgical drain Care for your drain as told by your health care provider. This is important to help prevent infection. If your drain is placed at your back, or any other hard-to-reach area,  ask another person to assist you in performing the following tasks: General  care  Keep the skin around the drain dry and covered with a dressing at all times.  Check your drain area every day for signs of infection. Check for: ? Redness, swelling, or pain. ? Pus or a bad smell. ? Cloudy drainage. ? Tenderness or pressure at the drain exit site. Changing the dressing Follow instructions from your health care provider about how to change your dressing. Change your dressing at least once a day. Change it more often if needed to keep the dressing dry. Make sure you: 1. Gather your supplies. 2. Wash your hands with soap and water before you change your dressing. If soap and water are not available, use hand sanitizer. 3. Remove the old dressing. Avoid using scissors to do that. 4. Wash your hands with soap and water again after removing the old dressing. 5. Use sterile saline to clean your skin around the drain. You may need to use a cotton swab to clean the skin. 6. Place the tube through the slit in a drain sponge. Place the drain sponge so that it covers your wound. 7. Place the gauze square or another drain sponge on top of the drain sponge that is on the wound. Make sure the tube is between those layers. 8. Tape the dressing to your skin. 9. Tape the drainage tube to your skin 1-2 inches (2.5-5 cm) below the place where the tube enters your body. Taping keeps the tube from pulling on any stitches (sutures) that you have. 10. Wash your hands with soap and water. 11. Write down the color of your drainage and how often you change your dressing. How to empty your active drain  1. Make sure that you have a measuring cup that you can empty your drainage into. 2. Wash your hands with soap and water. If soap and water are not available, use hand sanitizer. 3. Loosen any pins or clips that hold the tube in place. 4. If your health care provider tells you to strip the tube to prevent clots and tube blockages: ? Hold the tube at the skin with one hand. Use your other hand  to pinch the tubing with your thumb and first finger. ? Gently move your fingers down the tube while squeezing very lightly. This clears any drainage, clots, or tissue from the tube. ? You may need to do this several times each day to keep the tube clear. Do not pull on the tube. 5. Open the bulb cap or the drain plug. Do not touch the inside of the cap or the bottom of the plug. 6. Turn the device upside down and gently squeeze. 7. Empty all of the drainage into the measuring cup. 8. Compress the bulb or the container and replace the cap or the plug. To compress the bulb or the container, squeeze it firmly in the middle while you close the cap or plug the container. 9. Write down the amount of drainage that you have in each 24-hour period. If you have less than 2 Tbsp (30 mL) of drainage during 24 hours, contact your health care provider. 10. Flush the drainage down the toilet. 11. Wash your hands with soap and water. Contact a health care provider if:  You have redness, swelling, or pain around your drain area.  You have pus or a bad smell coming from your drain area.  You have a fever or chills.  The skin around your  drain is warm to the touch.  The amount of drainage that you have is increasing instead of decreasing.  You have drainage that is cloudy.  There is a sudden stop or a sudden decrease in the amount of drainage that you have.  Your drain tube falls out.  Your active drain does not stay compressed after you empty it. Summary  Surgical drains are used to remove extra fluid that normally builds up in a surgical wound after surgery.  Different kinds of surgical drains include active drains and passive drains. Active drains use suction to pull drainage away from the surgical wound, and passive drains allow fluid to drain naturally.  It is important to care for your drain to prevent infection. If your drain is placed at your back, or any other hard-to-reach area, ask  another person to assist you.  Contact your health care provider if you have redness, swelling, or pain around your drain area. This information is not intended to replace advice given to you by your health care provider. Make sure you discuss any questions you have with your health care provider. Document Released: 05/30/2000 Document Revised: 07/07/2018 Document Reviewed: 07/07/2018 Elsevier Patient Education  2020 [employer].

## 2019-05-11 NOTE — Progress Notes (Signed)
PROGRESS NOTE  Randy Beasley  DOB: 1956-02-17  PCP: Ivan Anchors, MD ERX:[id number]  DOA: 05/03/2019  LOS: 8 days   Chief Complaint  Patient presents with  . Chest Pain   Brief narrative: Randy Beasley is a 63 y.o. male with PMH of HTN, CAD, MI s/p stent, CHF, ischemic cardiomyopathy EF 25 to 30%, hypothyroidism, seizures, hepatic steatosis. Patient presented to the ED on 05/03/2019 with complaint of abdominal pain radiating to the chest started after dinner, seated with nausea and bilious emesis.  In the ED, patient was afebrile and hemodynamically stable.  He had tenderness in the right upper quadrant. Work-up showed WBC 11.7, potassium 3.8, BUN/creatinine 19/1.91, AST/ALT/bilirubin elevated to 109/127/2.1  CT angio of the chest and abdomen showed: 1. Findings suspicious of acute cholecystitis which include abnormal gallbladder with stones/sludge, evidence of pericholecystic inflammation, and superimposed pneumobilia. 2. Possible hepatic Cirrhosis, but no stigmata of portal venous hypertension. Extensive calcified coronary artery atherosclerosis and/or stent.   Patient was admitted under hospitalist medicine service for acute cholecystitis.  IV Zosyn was started.  General surgery as well as cardiology consultation was obtained.  Subjective: Patient was seen and examined this morning.  Feels better.  Feels ready to go home.  However, later in the morning, patient vomited a large amount of liquid.   An abdominal x-ray was obtained which showed progressive gaseous distention of small bowel loops, suspicious for obstruction.   Discharge held for today.  Assessment/Plan: Acute cholecystitis  -Presented with abdominal pain, nausea, vomiting.  Positive HIDA scan -11/19, patient underwent laparoscopic cholecystectomy and drain placement by Dr. Ninfa Linden. -Currently in postoperative ileus.  Seems to be slowly resolving.   -Currently on IV Unasyn today.   -Bowel function  returned back to patient had an episode of vomiting today.   Small bowel obstruction -Vomited a large amount of liquid today. An abdominal x-ray was obtained which showed progressive gaseous distention of small bowel loops, suspicious for obstruction.   -Diet scale back to full liquid. -General surgery following.  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 No evidence of portal hypertension at this time. -Likely related to hepatic steatosis as well as alcohol use. -Liver enzymes fluctuating.  Chronically somewhat elevated.  Follow-up CMP tomorrow. -Patient will follow-up with GI as an outpatient.  Acute kidney injury on CKD stage 3a -Baseline creatinine 1.3, CKD stage 3a -Creatinine peaked at 1.86 on 11/22.  Improved to normal now.  Lasix on hold.  History of systolic congestive heart failure -Echocardiogram 11/18 showed EF of 40 to 45% which is an improvement compared to previous. -Does not seem decompensated at this time.   -Home meds include Lasix, metoprolol, ramipril.  Continue metoprolol.  Lasix and ramipril on hold. -I would not resume Lasix yet because he may continue to drop and is at risk of dehydration.  Coronary artery disease/history of NSTEMI status post PCI. -No chest pain. -Cardiac enzymes negative. -On aspirin 81 mg daily at home.  Hypothyroidism -continue Synthroid   Depression - continue BuSpar.    Mobility: Encourage ambulation DVT prophylaxis:  Subcu Lovenox Code Status:   Code Status: Full Code  Family Communication:  Not at bedside Expected Discharge:  Hold discharge today because of SBO.  Consultants:  Cardiology, general surgery  Procedures:  None  Antimicrobials: Anti-infectives (From admission, onward)   Start     Dose/Rate Route Frequency Ordered Stop  05/09/19 1600  Ampicillin-Sulbactam (UNASYN) 3 g in sodium chloride 0.9 %  100 mL IVPB  Status:  Discontinued     3 g 200 mL/hr over 30 Minutes Intravenous Every 6 hours 05/09/19 1237 05/10/19 1142   05/04/19 1530  piperacillin-tazobactam (ZOSYN) IVPB 3.375 g  Status:  Discontinued     3.375 g 12.5 mL/hr over 240 Minutes Intravenous Every 8 hours 05/04/19 1523 05/09/19 1237   05/04/19 0600  piperacillin-tazobactam (ZOSYN) IVPB 3.375 g  Status:  Discontinued     3.375 g 100 mL/hr over 30 Minutes Intravenous Every 8 hours 05/04/19 0228 05/04/19 1522   05/03/19 2200  piperacillin-tazobactam (ZOSYN) IVPB 3.375 g     3.375 g 100 mL/hr over 30 Minutes Intravenous  Once 05/03/19 2152 05/04/19 0011      Diet Order            Diet full liquid Room service appropriate? Yes; Fluid consistency: Thin  Diet effective now              Infusions:  . chlorproMAZINE (THORAZINE) IV    . lactated ringers Stopped (05/11/19 0827)    Scheduled Meds: . aspirin  81 mg Oral Daily  . cholecalciferol  1,000 Units Oral Daily  . docusate sodium  100 mg Oral BID  . enoxaparin (LOVENOX) injection  40 mg Subcutaneous Q24H  . folic acid  1 mg Oral Daily  . gabapentin  300 mg Oral TID  . levothyroxine  125 mcg Oral Q0600  . methocarbamol  500 mg Oral TID  . metoCLOPramide (REGLAN) injection  10 mg Intravenous Q6H  . metoprolol tartrate  12.5 mg Oral BID  . multivitamin with minerals  1 tablet Oral Daily  . pantoprazole  40 mg Oral Daily  . polyethylene glycol  17 g Oral Daily  . thiamine  100 mg Oral Daily   Or  . thiamine  100 mg Intravenous Daily    PRN meds: bisacodyl, chlorproMAZINE (THORAZINE) IV, HYDROmorphone (DILAUDID) injection, ondansetron (ZOFRAN) IV, oxyCODONE   Objective: Vitals:   05/11/19 0800 05/11/19 1200  BP: 123/78 124/82  Pulse:    Resp:  18  Temp:  99.4 F (37.4 C)  SpO2:      Intake/Output Summary (Last 24 hours) at 05/11/2019 1522 Last data filed at 05/11/2019 1100 Gross per 24 hour  Intake 505 ml  Output -  Net 505 ml   Filed  Weights   05/04/19 1530 05/04/19 1548 05/05/19 0311  Weight: 109.6 kg 108.6 kg 109.4 kg   Weight change:  Body mass index is 33.64 kg/m.   Physical Exam at my evaluation in the morning:  General exam: Not in physical distress today.  Pain controlled with meds Skin: No rashes, lesions or ulcers. HEENT: Atraumatic, normocephalic, supple neck, no obvious bleeding Lungs: Clear to auscultation bilaterally CVS: Regular rate and rhythm, no murmur GI/Abd: Soft, less tender, bowel sound present CNS: Alert, awake, oriented x3 Psychiatry: Mood appropriate Extremities: Upper extremity seems to be swelling up, no calf tenderness  Data Review: I have personally reviewed the laboratory data and studies available.  Recent Labs  Lab 05/06/19 0237 05/08/19 0232 05/09/19 0534 05/10/19 0324 05/11/19 0323  WBC 15.7* 17.0* 10.6* 8.3 7.4  NEUTROABS  --  13.6* 7.5 5.2 4.9  HGB 14.6 16.4 15.0 13.7 13.3  HCT 41.8 46.7 42.6 39.0 37.8*  MCV 101.2* 99.8 100.5* 100.3* 100.8*  PLT 133* 244 179 153 167   Recent Labs  Lab 05/06/19 0237 05/08/19 0232  05/09/19 0534 05/10/19 0324 05/11/19 0323  NA 132* 133* 133* 134* 133*  K 4.5 4.1 3.7 3.4* 4.3  CL 99 98 97* 99 101  CO2 20* 22 23 25 23   GLUCOSE 132* 117* 93 82 82  BUN 30* 46* 34* 23 14  CREATININE 1.67* 1.86* 1.53* 1.24 1.15  CALCIUM 8.7* 9.3 8.7* 8.4* 8.0*  MG  --  2.7*  --   --   --   PHOS  --  4.8*  --   --   --     , MD  [facility] 05/11/2019

## 2019-05-11 NOTE — Progress Notes (Addendum)
[facility] Progress Note  6 Days Post-Op  Subjective: CC-  Up walking around room. Feeling better each day. Tolerating soft diet. Denies n/v. Passing flatus and had another BM this AM. Still feels intermittently bloated. LFTs slightly up today  Objective: Vital signs in last 24 hours: Temp:  [98.2 F (36.8 C)-98.4 F (36.9 C)] 98.4 F (36.9 C) (11/25 0414) Pulse Rate:  [65-77] 68 (11/25 0005) Resp:  [18-21] 21 (11/25 0005) BP: (116-125)/(78-84) 123/78 (11/25 0800) SpO2:  [93 %-94 %] 93 % (11/25 0005) Last BM Date: 05/10/19  Intake/Output from previous day: 11/24 0701 - 11/25 0700 In: 540 [P.O.:540] Out: 25 [Drains:25] Intake/Output this shift: Total I/O In: 500 [P.O.:480; Other:20] Out: -   PE: Gen: Alert, NAD, pleasant Card: RRR Pulm: CTAB, rate and effort normal Abd: Soft,moderatelydistended,mild RUQ TTP, +BS, incisions c/d/i, drain in RUQ with small amount serous/slightly golden fluid in bulb Skin: warm and dry  Lab Results:  Recent Labs    05/10/19 0324 05/11/19 0323  WBC 8.3 7.4  HGB 13.7 13.3  HCT 39.0 37.8*  PLT 153 167   BMET Recent Labs    05/10/19 0324 05/11/19 0323  NA 134* 133*  K 3.4* 4.3  CL 99 101  CO2 25 23  GLUCOSE 82 82  BUN 23 14  CREATININE 1.24 1.15  CALCIUM 8.4* 8.0*   PT/INR No results for input(s): LABPROT, INR in the last 72 hours. CMP     Component Value Date/Time   NA 133 (L) 05/11/2019 0323   NA 142 07/28/2017 0847   K 4.3 05/11/2019 0323   CL 101 05/11/2019 0323   CO2 23 05/11/2019 0323   GLUCOSE 82 05/11/2019 0323   BUN 14 05/11/2019 0323   BUN 17 07/28/2017 0847   CREATININE 1.15 05/11/2019 0323   CREATININE 1.25 12/24/2015 0827   CALCIUM 8.0 (L) 05/11/2019 0323   PROT 5.1 (L) 05/11/2019 0323   ALBUMIN 2.5 (L) 05/11/2019 0323   AST 116 (H) 05/11/2019 0323   ALT 159 (H) 05/11/2019 0323   ALKPHOS 80 05/11/2019 0323   BILITOT 1.9 (H) 05/11/2019 0323   GFRNONAA >60 05/11/2019 0323   GFRAA >60 05/11/2019 0323   Lipase     Component Value Date/Time   LIPASE 38 05/03/2019 2040       Studies/Results: No results found.  Anti-infectives: Anti-infectives (From admission, onward)   Start     Dose/Rate Route Frequency Ordered Stop   05/09/19 1600  Ampicillin-Sulbactam (UNASYN) 3 g in sodium chloride 0.9 % 100 mL IVPB  Status:  Discontinued     3 g 200 mL/hr over 30 Minutes Intravenous Every 6 hours 05/09/19 1237 05/10/19 1142   05/04/19 1530  piperacillin-tazobactam (ZOSYN) IVPB 3.375 g  Status:  Discontinued     3.375 g 12.5 mL/hr over 240 Minutes Intravenous Every 8 hours 05/04/19 1523 05/09/19 1237   05/04/19 0600  piperacillin-tazobactam (ZOSYN) IVPB 3.375 g  Status:  Discontinued     3.375 g 100 mL/hr over 30 Minutes Intravenous Every 8 hours 05/04/19 0228 05/04/19 1522   05/03/19 2200  piperacillin-tazobactam (ZOSYN) IVPB 3.375 g     3.375 g 100 mL/hr over 30 Minutes Intravenous  Once 05/03/19 2152 05/04/19 0011       Assessment/Plan Hx of STEMI s/p stent to LAD Hx of seizures - has been off antiepileptics for several years with no seizures OA Chronic systolic CHF Hypothyroidism Alcohol abuse with cirrhosis  Tobacco abuse - smokes 3-4 cigarettes daily  Acute cholecystitis S/P laparoscopic cholecystectomy and drain placement, Dr. Magnus Ivan, 11/19 -POD#6 - post-op ileus improving, tolerating soft diet and having bowel function - continue mobilizing - Drain output low volume but looks a little more golden than serous today, no frank bile. LFTs slightly up compared to yesterday (AST 116, ALT 159, Tbili 1.9 from 1.8), although he has had chronically elevated LFTs due to liver disease. Will keep drain in to monitor for leak - Ok for discharge home today from surgical standpoint. Will arrange follow up in our office for next week to check the drain. Please teach patient to empty bulb and record 24hr output.  FEN: soft diet, bowel regimen VTE: SCD's,  lovenox PJ:KDTOI 11/17>>11/23, unasyn 11/23>>11/24 Foley:none Follow up:[facility]   LOS: 8 days    Franne Forts, [facility] 05/11/2019, 9:19 AM Please see Amion for pager number during day hours 7:00am-4:30pm

## 2019-05-11 NOTE — Progress Notes (Signed)
Pt is having watery diarrhea, brown/green every 2 hours today.

## 2019-05-11 NOTE — Progress Notes (Signed)
As I was getting pt ready for discharge, he vomited a large amount of liquid.  I notified surgery, Brooke Meuth PA-C and Dr. Pietro Cassis, portable stat abd xr ordered.

## 2019-05-12 ENCOUNTER — Inpatient Hospital Stay (HOSPITAL_COMMUNITY): Payer: Medicare HMO

## 2019-05-12 LAB — CBC WITH DIFFERENTIAL/PLATELET
Abs Immature Granulocytes: 0.05 10*3/uL (ref 0.00–0.07)
Basophils Absolute: 0.1 10*3/uL (ref 0.0–0.1)
Basophils Relative: 1 %
Eosinophils Absolute: 0.1 10*3/uL (ref 0.0–0.5)
Eosinophils Relative: 1 %
HCT: 44.4 % (ref 39.0–52.0)
Hemoglobin: 15.4 g/dL (ref 13.0–17.0)
Immature Granulocytes: 1 %
Lymphocytes Relative: 10 %
Lymphs Abs: 1 10*3/uL (ref 0.7–4.0)
MCH: 34.8 pg — ABNORMAL HIGH (ref 26.0–34.0)
MCHC: 34.7 g/dL (ref 30.0–36.0)
MCV: 100.5 fL — ABNORMAL HIGH (ref 80.0–100.0)
Monocytes Absolute: 0.5 10*3/uL (ref 0.1–1.0)
Monocytes Relative: 5 %
Neutro Abs: 9 10*3/uL — ABNORMAL HIGH (ref 1.7–7.7)
Neutrophils Relative %: 82 %
Platelets: 209 10*3/uL (ref 150–400)
RBC: 4.42 MIL/uL (ref 4.22–5.81)
RDW: 12.4 % (ref 11.5–15.5)
WBC: 10.7 10*3/uL — ABNORMAL HIGH (ref 4.0–10.5)
nRBC: 0 % (ref 0.0–0.2)

## 2019-05-12 LAB — COMPREHENSIVE METABOLIC PANEL
ALT: 185 U/L — ABNORMAL HIGH (ref 0–44)
AST: 106 U/L — ABNORMAL HIGH (ref 15–41)
Albumin: 3 g/dL — ABNORMAL LOW (ref 3.5–5.0)
Alkaline Phosphatase: 132 U/L — ABNORMAL HIGH (ref 38–126)
Anion gap: 11 (ref 5–15)
BUN: 16 mg/dL (ref 8–23)
CO2: 17 mmol/L — ABNORMAL LOW (ref 22–32)
Calcium: 8.5 mg/dL — ABNORMAL LOW (ref 8.9–10.3)
Chloride: 105 mmol/L (ref 98–111)
Creatinine, Ser: 1.29 mg/dL — ABNORMAL HIGH (ref 0.61–1.24)
GFR calc Af Amer: >60 mL/min (ref >60)
GFR calc non Af Amer: 58 mL/min — ABNORMAL LOW (ref >60)
Glucose, Bld: 112 mg/dL — ABNORMAL HIGH (ref 70–99)
Potassium: 4.7 mmol/L (ref 3.5–5.1)
Sodium: 133 mmol/L — ABNORMAL LOW (ref 135–145)
Total Bilirubin: 2.4 mg/dL — ABNORMAL HIGH (ref 0.3–1.2)
Total Protein: 6 g/dL — ABNORMAL LOW (ref 6.5–8.1)

## 2019-05-12 LAB — CULTURE, BLOOD (ROUTINE X 2)
Culture: NO GROWTH
Culture: NO GROWTH
Special Requests: ADEQUATE

## 2019-05-12 LAB — LIPASE, BLOOD: Lipase: 68 U/L — ABNORMAL HIGH (ref 11–51)

## 2019-05-12 MED ORDER — IOHEXOL 300 MG/ML  SOLN
100.0000 mL | Freq: Once | INTRAMUSCULAR | Status: AC | PRN
  Administered 2019-05-12: 100 mL via INTRAVENOUS

## 2019-05-12 MED ORDER — SODIUM CHLORIDE 0.9 % IV SOLN
INTRAVENOUS | Status: DC
  Administered 2019-05-12 – 2019-05-14 (×3): via INTRAVENOUS

## 2019-05-12 NOTE — Progress Notes (Signed)
Paged by bedside RN regarding pts increased abdominal pain and distention. Pain had been worsening since pt finished his dinner tray and had an Ensure. Upon entering the room pt is alert and oriented sitting on the side of the bed. Pt states that his abdominal pain was a 10/10 and worse than ever but he had large amount emesis before my arrival and is now feeling better. Abdomen soft, distended but not taught, drain in place and incision is clean, dry, and intact. VSS. Pt denies any further nausea. Ordered an ABD X-Ray which showed "Persistent small bowel dilatation is seen relatively stable from the prior exam. No free air is noted. No acute bony abnormality is noted. No soft tissue mass is seen." Will make the pt NPO and continue to monitor.   Arby Barrette AGPCNP-BC, AGNP-C [facility] Pager [phone]

## 2019-05-12 NOTE — Progress Notes (Signed)
PROGRESS NOTE  Randy Beasley  DOB: October 11, 1955  PCP: Pearson Forster, MD EGB:[id number]  DOA: 05/03/2019  LOS: 9 days   Chief Complaint  Patient presents with  . Chest Pain   Brief narrative: Randy Beasley is a 63 y.o. male with PMH of HTN, CAD, MI s/p stent, CHF, ischemic cardiomyopathy EF 25 to 30%, hypothyroidism, seizures, hepatic steatosis. Patient presented to the ED on 05/03/2019 with complaint of abdominal pain radiating to the chest started after dinner, seated with nausea and bilious emesis.  In the ED, patient was afebrile and hemodynamically stable.  He had tenderness in the right upper quadrant. Work-up showed WBC 11.7, potassium 3.8, BUN/creatinine 19/1.91, AST/ALT/bilirubin elevated to 109/127/2.1  CT angio of the chest and abdomen showed: 1. Findings suspicious of acute cholecystitis which include abnormal gallbladder with stones/sludge, evidence of pericholecystic inflammation, and superimposed pneumobilia. 2. Possible hepatic Cirrhosis, but no stigmata of portal venous hypertension. Extensive calcified coronary artery atherosclerosis and/or stent.   Patient was admitted under hospitalist medicine service for acute cholecystitis.  IV Zosyn was started.  General surgery as well as cardiology consultation was obtained.  Subjective: Patient was seen and examined this afternoon. He was ready for discharge yesterday but ended up having vomiting.  X-ray showed bowel obstruction.  Continues to have abdominal pain and vomiting today.   Plan for CT scan of abdomen this afternoon..  Assessment/Plan: Acute cholecystitis  -Presented with abdominal pain, nausea, vomiting.  Positive HIDA scan -11/19, patient underwent laparoscopic cholecystectomy and drain placement by Dr. Magnus Ivan. -Postoperative ileus slowly improved but he had a setback yesterday with new finding of bowel obstruction.  Small bowel obstruction -Vomited a large amount of liquid yesterday. An abdominal  x-ray was obtained which showed progressive gaseous distention of small bowel loops, suspicious for obstruction.   -Diet scale back to full liquid.  Continues to have nausea and vomiting.  CT of the abdomen planned for this afternoon. -General surgery following.  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 No evidence of portal hypertension at this time. -Likely related to hepatic steatosis as well as alcohol use. -Liver enzymes fluctuating.  Chronically somewhat elevated.  Follow-up CMP tomorrow. -Patient will follow-up with GI as an outpatient.  Acute kidney injury on CKD stage 3a -Baseline creatinine 1.3, CKD stage 3a -Creatinine peaked at 1.86 on 11/22. Improved to normal.  Lasix remains on hold. -I will start him on gentle hydration with normal saline at 75 ml/hr.  History of systolic congestive heart failure -Echocardiogram 11/18 showed EF of 40 to 45% which is an improvement compared to previous. -Does not seem decompensated at this time.   -Home meds include Lasix, metoprolol, ramipril.  Continue metoprolol.  Lasix and ramipril on hold. -I would not resume Lasix yet because he may continue to drop and is at risk of dehydration.  Coronary artery disease/history of NSTEMI status post PCI. -No chest pain. -Cardiac enzymes negative. -On aspirin 81 mg daily at home.  Hypothyroidism -continue Synthroid   Depression - continue BuSpar.    Mobility: Encourage ambulation DVT prophylaxis:  Subcu Lovenox Code Status:   Code Status: Full Code  Family Communication:  Not at bedside Expected Discharge:  Discharge held because of new finding of SBO.  Hopefully home in next 1 to 2 days.  Consultants:  Cardiology, general surgery  Procedures:  None  Antimicrobials: Anti-infectives (From admission, onward)   Start  Dose/Rate Route Frequency Ordered Stop   05/09/19  1600  Ampicillin-Sulbactam (UNASYN) 3 g in sodium chloride 0.9 % 100 mL IVPB  Status:  Discontinued     3 g 200 mL/hr over 30 Minutes Intravenous Every 6 hours 05/09/19 1237 05/10/19 1142   05/04/19 1530  piperacillin-tazobactam (ZOSYN) IVPB 3.375 g  Status:  Discontinued     3.375 g 12.5 mL/hr over 240 Minutes Intravenous Every 8 hours 05/04/19 1523 05/09/19 1237   05/04/19 0600  piperacillin-tazobactam (ZOSYN) IVPB 3.375 g  Status:  Discontinued     3.375 g 100 mL/hr over 30 Minutes Intravenous Every 8 hours 05/04/19 0228 05/04/19 1522   05/03/19 2200  piperacillin-tazobactam (ZOSYN) IVPB 3.375 g     3.375 g 100 mL/hr over 30 Minutes Intravenous  Once 05/03/19 2152 05/04/19 0011      Diet Order            Diet NPO time specified Except for: Ice Chips, Sips with Meds, Other (See Comments)  Diet effective now              Infusions:  . sodium chloride    . chlorproMAZINE (THORAZINE) IV    . lactated ringers Stopped (05/11/19 0827)    Scheduled Meds: . aspirin  81 mg Oral Daily  . cholecalciferol  1,000 Units Oral Daily  . docusate sodium  100 mg Oral BID  . enoxaparin (LOVENOX) injection  40 mg Subcutaneous Q24H  . folic acid  1 mg Oral Daily  . gabapentin  300 mg Oral TID  . levothyroxine  125 mcg Oral Q0600  . methocarbamol  500 mg Oral TID  . metoCLOPramide (REGLAN) injection  10 mg Intravenous Q6H  . metoprolol tartrate  12.5 mg Oral BID  . multivitamin with minerals  1 tablet Oral Daily  . pantoprazole  40 mg Oral Daily  . polyethylene glycol  17 g Oral Daily  . thiamine  100 mg Oral Daily   Or  . thiamine  100 mg Intravenous Daily    PRN meds: bisacodyl, chlorproMAZINE (THORAZINE) IV, HYDROmorphone (DILAUDID) injection, ondansetron (ZOFRAN) IV, oxyCODONE   Objective: Vitals:   05/12/19 0812 05/12/19 1225  BP: 111/89 109/75  Pulse: 82 70  Resp: 20   Temp: 98.1 F (36.7 C) 98.5 F (36.9 C)  SpO2: 97% 98%    Intake/Output Summary (Last 24 hours) at  05/12/2019 1434 Last data filed at 05/12/2019 0000 Gross per 24 hour  Intake 210 ml  Output 700 ml  Net -490 ml   Filed Weights   05/04/19 1530 05/04/19 1548 05/05/19 0311  Weight: 109.6 kg 108.6 kg 109.4 kg   Weight change:  Body mass index is 33.64 kg/m.   Physical Exam General exam: Continues to have abdominal pain  skin: No rashes, lesions or ulcers. HEENT: Atraumatic, normocephalic, supple neck, no obvious bleeding Lungs: Clear to auscultation bilaterally CVS: Regular rate and rhythm, no murmur GI/Abd: Soft, mild diffuse tenderness present, bowel sound present, JP drain with serosanguineous fluid. CNS: Alert, awake, oriented x3 Psychiatry: Frustrated mood Extremities: Upper extremity seems to be swelling up, no calf tenderness  Data Review: I have personally reviewed the laboratory data and studies available.  Recent Labs  Lab 05/08/19 0232 05/09/19 0534 05/10/19 0324 05/11/19 0323 05/12/19 0300  WBC 17.0* 10.6* 8.3 7.4 10.7*  NEUTROABS 13.6* 7.5 5.2 4.9 9.0*  HGB 16.4 15.0 13.7 13.3 15.4  HCT 46.7 42.6 39.0 37.8* 44.4  MCV 99.8 100.5* 100.3* 100.8* 100.5*  PLT  244 179 153 167 209   Recent Labs  Lab 05/08/19 0232 05/09/19 0534 05/10/19 0324 05/11/19 0323 05/12/19 0300  NA 133* 133* 134* 133* 133*  K 4.1 3.7 3.4* 4.3 4.7  CL 98 97* 99 101 105  CO2 22 23 25 23  17*  GLUCOSE 117* 93 82 82 112*  BUN 46* 34* 23 14 16   CREATININE 1.86* 1.53* 1.24 1.15 1.29*  CALCIUM 9.3 8.7* 8.4* 8.0* 8.5*  MG 2.7*  --   --   --   --   PHOS 4.8*  --   --   --   --     Lorin GlassBinaya Naythen Heikkila, MD  [facility] 05/12/2019

## 2019-05-12 NOTE — Progress Notes (Signed)
[facility] Progress Note  7 Days Post-Op  Subjective: CC-  Had significant episode of emesis last night.  Felt "like I was going to meet my maker."    Objective: Vital signs in last 24 hours: Temp:  [98.1 F (36.7 C)-99.4 F (37.4 C)] 98.1 F (36.7 C) (11/26 0812) Pulse Rate:  [82-99] 82 (11/26 0812) Resp:  [18-20] 20 (11/26 0812) BP: (109-124)/(76-89) 111/89 (11/26 0812) SpO2:  [94 %-97 %] 97 % (11/26 0812) Last BM Date: 05/11/19  Intake/Output from previous day: 11/25 0701 - 11/26 0700 In: 715 [P.O.:690] Out: 700 [Urine:700] Intake/Output this shift: No intake/output data recorded.  PE: Gen: Alert, NAD, pleasant.  Up and OOB. Pulm: breathing comfortably. Abd: Soft,moderatelydistended,drain serous.  Incisions c/d/i.  Minimally tender.   Skin: warm and dry  Lab Results:  Recent Labs    05/11/19 0323 05/12/19 0300  WBC 7.4 10.7*  HGB 13.3 15.4  HCT 37.8* 44.4  PLT 167 209   BMET Recent Labs    05/11/19 0323 05/12/19 0300  NA 133* 133*  K 4.3 4.7  CL 101 105  CO2 23 17*  GLUCOSE 82 112*  BUN 14 16  CREATININE 1.15 1.29*  CALCIUM 8.0* 8.5*   PT/INR No results for input(s): LABPROT, INR in the last 72 hours. CMP     Component Value Date/Time   NA 133 (L) 05/12/2019 0300   NA 142 07/28/2017 0847   K 4.7 05/12/2019 0300   CL 105 05/12/2019 0300   CO2 17 (L) 05/12/2019 0300   GLUCOSE 112 (H) 05/12/2019 0300   BUN 16 05/12/2019 0300   BUN 17 07/28/2017 0847   CREATININE 1.29 (H) 05/12/2019 0300   CREATININE 1.25 12/24/2015 0827   CALCIUM 8.5 (L) 05/12/2019 0300   PROT 6.0 (L) 05/12/2019 0300   ALBUMIN 3.0 (L) 05/12/2019 0300   AST 106 (H) 05/12/2019 0300   ALT 185 (H) 05/12/2019 0300   ALKPHOS 132 (H) 05/12/2019 0300   BILITOT 2.4 (H) 05/12/2019 0300   GFRNONAA 58 (L) 05/12/2019 0300   GFRAA >60 05/12/2019 0300   Lipase     Component Value Date/Time   LIPASE 38 05/03/2019 2040       Studies/Results: Dg Abd Portable  1v  Result Date: 05/11/2019 CLINICAL DATA:  Abdominal distension EXAM: PORTABLE ABDOMEN - 1 VIEW COMPARISON:  Film from earlier in the same day. FINDINGS: Persistent small bowel dilatation is seen relatively stable from the prior exam. No free air is noted. No acute bony abnormality is noted. No soft tissue mass is seen. IMPRESSION: Persistent increased small bowel dilatation consistent with obstruction. Electronically Signed   By: Inez Catalina M.D.   On: 05/11/2019 23:44   Dg Abd Portable 1v  Result Date: 05/11/2019 CLINICAL DATA:  Nausea and vomiting. EXAM: PORTABLE ABDOMEN - 1 VIEW COMPARISON:  05/07/2019 FINDINGS: Single supine view of the abdomen and pelvis. Progressive gaseous distension of small bowel loops, including at 5.0 cm in the left side of the abdomen. Minimal rectosigmoid gas. No pneumatosis or gross free intraperitoneal air. The upper abdomen is partially excluded. IMPRESSION: Progressive gaseous distension of small bowel loops, suspicious for obstruction. Adynamic ileus felt less likely. Electronically Signed   By: Abigail Miyamoto M.D.   On: 05/11/2019 13:14    Anti-infectives: Anti-infectives (From admission, onward)   Start     Dose/Rate Route Frequency Ordered Stop   05/09/19 1600  Ampicillin-Sulbactam (UNASYN) 3 g in sodium chloride 0.9 % 100 mL IVPB  Status:  Discontinued     3 g 200 mL/hr over 30 Minutes Intravenous Every 6 hours 05/09/19 1237 05/10/19 1142   05/04/19 1530  piperacillin-tazobactam (ZOSYN) IVPB 3.375 g  Status:  Discontinued     3.375 g 12.5 mL/hr over 240 Minutes Intravenous Every 8 hours 05/04/19 1523 05/09/19 1237   05/04/19 0600  piperacillin-tazobactam (ZOSYN) IVPB 3.375 g  Status:  Discontinued     3.375 g 100 mL/hr over 30 Minutes Intravenous Every 8 hours 05/04/19 0228 05/04/19 1522   05/03/19 2200  piperacillin-tazobactam (ZOSYN) IVPB 3.375 g     3.375 g 100 mL/hr over 30 Minutes Intravenous  Once 05/03/19 2152 05/04/19 0011        Assessment/Plan Hx of STEMI s/p stent to LAD Hx of seizures - has been off antiepileptics for several years with no seizures OA Chronic systolic CHF Hypothyroidism Alcohol abuse with cirrhosis  Tobacco abuse - smokes 3-4 cigarettes daily  Acute cholecystitis S/P laparoscopic cholecystectomy and drain placement, Dr. Magnus Ivan, 11/19 -POD#7 - post-op ileus seemed to be improving, but had a step back last night.   - continue mobilizing - drain looks consistent with serous fluid.  Does not appear bilious.   - Will get CT today given pain, nausea.    FEN: npo x sips/chips.   bowel regimen VTE: SCD's, lovenox OX:BDZHG 11/17>>11/23, unasyn 11/23>>11/24 Foley:none Follow up:[facility]   LOS: 9 days   Maudry Diego, MD FACS Surgical Oncology, General Surgery, Trauma and Critical Surgical [facility], [place] [phone] [email or web address] for coverage night/weekend

## 2019-05-13 DIAGNOSIS — R1011 Right upper quadrant pain: Secondary | ICD-10-CM

## 2019-05-13 LAB — CBC WITH DIFFERENTIAL/PLATELET
Abs Immature Granulocytes: 0.03 10*3/uL (ref 0.00–0.07)
Basophils Absolute: 0 10*3/uL (ref 0.0–0.1)
Basophils Relative: 0 %
Eosinophils Absolute: 0.2 10*3/uL (ref 0.0–0.5)
Eosinophils Relative: 3 %
HCT: 37.3 % — ABNORMAL LOW (ref 39.0–52.0)
Hemoglobin: 13 g/dL (ref 13.0–17.0)
Immature Granulocytes: 1 %
Lymphocytes Relative: 15 %
Lymphs Abs: 0.9 10*3/uL (ref 0.7–4.0)
MCH: 35.2 pg — ABNORMAL HIGH (ref 26.0–34.0)
MCHC: 34.9 g/dL (ref 30.0–36.0)
MCV: 101.1 fL — ABNORMAL HIGH (ref 80.0–100.0)
Monocytes Absolute: 0.4 10*3/uL (ref 0.1–1.0)
Monocytes Relative: 6 %
Neutro Abs: 4.9 10*3/uL (ref 1.7–7.7)
Neutrophils Relative %: 75 %
Platelets: 156 10*3/uL (ref 150–400)
RBC: 3.69 MIL/uL — ABNORMAL LOW (ref 4.22–5.81)
RDW: 12.2 % (ref 11.5–15.5)
WBC: 6.4 10*3/uL (ref 4.0–10.5)
nRBC: 0 % (ref 0.0–0.2)

## 2019-05-13 LAB — COMPREHENSIVE METABOLIC PANEL
ALT: 131 U/L — ABNORMAL HIGH (ref 0–44)
AST: 69 U/L — ABNORMAL HIGH (ref 15–41)
Albumin: 2.6 g/dL — ABNORMAL LOW (ref 3.5–5.0)
Alkaline Phosphatase: 109 U/L (ref 38–126)
Anion gap: 10 (ref 5–15)
BUN: 13 mg/dL (ref 8–23)
CO2: 19 mmol/L — ABNORMAL LOW (ref 22–32)
Calcium: 7.8 mg/dL — ABNORMAL LOW (ref 8.9–10.3)
Chloride: 105 mmol/L (ref 98–111)
Creatinine, Ser: 1.2 mg/dL (ref 0.61–1.24)
GFR calc Af Amer: >60 mL/min (ref >60)
GFR calc non Af Amer: >60 mL/min (ref >60)
Glucose, Bld: 75 mg/dL (ref 70–99)
Potassium: 4.4 mmol/L (ref 3.5–5.1)
Sodium: 134 mmol/L — ABNORMAL LOW (ref 135–145)
Total Bilirubin: 1.7 mg/dL — ABNORMAL HIGH (ref 0.3–1.2)
Total Protein: 5.2 g/dL — ABNORMAL LOW (ref 6.5–8.1)

## 2019-05-13 LAB — MAGNESIUM: Magnesium: 2.2 mg/dL (ref 1.7–2.4)

## 2019-05-13 LAB — PHOSPHORUS: Phosphorus: 1.8 mg/dL — ABNORMAL LOW (ref 2.5–4.6)

## 2019-05-13 MED ORDER — SODIUM PHOSPHATES 45 MMOLE/15ML IV SOLN
30.0000 mmol | Freq: Once | INTRAVENOUS | Status: AC
  Administered 2019-05-13: 30 mmol via INTRAVENOUS
  Filled 2019-05-13: qty 10

## 2019-05-13 NOTE — Progress Notes (Signed)
PROGRESS NOTE  Randy Beasley  DOB: Nov 17, 1955  PCP: Pearson Forster, MD GYJ:[id number]  DOA: 05/03/2019  LOS: 10 days   Chief Complaint  Patient presents with   Chest Pain   Brief narrative: Randy Beasley is a 63 y.o. male with PMH of HTN, CAD, MI s/p stent, CHF, ischemic cardiomyopathy EF 25 to 30%, hypothyroidism, seizures, hepatic steatosis. Patient presented to the ED on 05/03/2019 with complaint of abdominal pain radiating to the chest started after dinner, seated with nausea and bilious emesis.  In the ED, patient was afebrile and hemodynamically stable.  He had tenderness in the right upper quadrant. Work-up showed WBC 11.7, potassium 3.8, BUN/creatinine 19/1.91, AST/ALT/bilirubin elevated to 109/127/2.1  CT angio of the chest and abdomen showed: 1. Findings suspicious of acute cholecystitis which include abnormal gallbladder with stones/sludge, evidence of pericholecystic inflammation, and superimposed pneumobilia. 2. Possible hepatic Cirrhosis, but no stigmata of portal venous hypertension. Extensive calcified coronary artery atherosclerosis and/or stent.   Patient was admitted under hospitalist medicine service for acute cholecystitis.  IV Zosyn was started.  General surgery as well as cardiology consultation was obtained.  Subjective: Patient was seen and examined this morning.  Patient was walking inside the room.  Not in distress. He is understandably upset about the setback on his discharge plan because of bowel obstruction. Hemodynamically stable. Labs from this morning noted. Creatinine improving, phosphorus low at 1.8, liver enzymes improving, WBC count normal. He was ready for discharge yesterday but ended up having vomiting.  X-ray showed bowel obstruction. Continues to have abdominal pain and vomiting today.   Plan for CT scan of abdomen this afternoon.  Assessment/Plan: Acute cholecystitis  -Presented with abdominal pain, nausea, vomiting.  Positive  HIDA scan -11/19, patient underwent laparoscopic cholecystectomy and drain placement by Dr. Magnus Ivan.  Small bowel obstruction -11/25, vomited a large amount of liquid. An abdominal x-ray was obtained which showed progressive gaseous distention of small bowel loops, suspicious for obstruction.  -11/26, CT scan of the abdomen continue to show postoperative ileus -General surgery following.  Noted plan to start on clear liquid diet today.  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 No evidence of portal hypertension at this time. -Likely related to hepatic steatosis as well as alcohol use. -Liver enzymes fluctuating.  Chronically somewhat elevated.  Follow-up CMP tomorrow. -Patient will follow-up with GI as an outpatient.  Acute kidney injury on CKD stage 3a -Baseline creatinine 1.3, CKD stage 3a -Creatinine peaked at 1.86 on 11/22. Improved to normal.  Lasix remains on hold. -Remains on gentle hydration with normal saline at 75 ml/hr.  History of systolic congestive heart failure -Echocardiogram 11/18 showed EF of 40 to 45% which is an improvement compared to previous. -Does not seem decompensated at this time.   -Home meds include Lasix, metoprolol, ramipril.  Continue metoprolol.  Lasix and ramipril on hold. -I would not resume Lasix yet because he may continue to drop and is at risk of dehydration.  Coronary artery disease/history of NSTEMI status post PCI. -No chest pain. -Cardiac enzymes negative. -On aspirin 81 mg daily at home.  Hypothyroidism -continue Synthroid   Depression - continue BuSpar.    Mobility: Encourage ambulation DVT prophylaxis:  Subcu Lovenox Code Status:   Code Status: Full Code  Family Communication:  Not at bedside Expected Discharge:  Discharge held because of new finding of SBO.  Hopefully home in next  1 to 2  days.  Consultants:  Cardiology, general surgery  Procedures:  None  Antimicrobials: Anti-infectives (From admission, onward)   Start     Dose/Rate Route Frequency Ordered Stop   05/09/19 1600  Ampicillin-Sulbactam (UNASYN) 3 g in sodium chloride 0.9 % 100 mL IVPB  Status:  Discontinued     3 g 200 mL/hr over 30 Minutes Intravenous Every 6 hours 05/09/19 1237 05/10/19 1142   05/04/19 1530  piperacillin-tazobactam (ZOSYN) IVPB 3.375 g  Status:  Discontinued     3.375 g 12.5 mL/hr over 240 Minutes Intravenous Every 8 hours 05/04/19 1523 05/09/19 1237   05/04/19 0600  piperacillin-tazobactam (ZOSYN) IVPB 3.375 g  Status:  Discontinued     3.375 g 100 mL/hr over 30 Minutes Intravenous Every 8 hours 05/04/19 0228 05/04/19 1522   05/03/19 2200  piperacillin-tazobactam (ZOSYN) IVPB 3.375 g     3.375 g 100 mL/hr over 30 Minutes Intravenous  Once 05/03/19 2152 05/04/19 0011      Diet Order            Diet NPO time specified Except for: Ice Chips, Sips with Meds, Other (See Comments)  Diet effective now              Infusions:   sodium chloride 75 mL/hr at 05/13/19 0342   chlorproMAZINE (THORAZINE) IV     lactated ringers Stopped (05/11/19 0827)   sodium phosphate  Dextrose 5% IVPB 30 mmol (05/13/19 1040)    Scheduled Meds:  aspirin  81 mg Oral Daily   cholecalciferol  1,000 Units Oral Daily   docusate sodium  100 mg Oral BID   enoxaparin (LOVENOX) injection  40 mg Subcutaneous Q24H   folic acid  1 mg Oral Daily   gabapentin  300 mg Oral TID   levothyroxine  125 mcg Oral Q0600   methocarbamol  500 mg Oral TID   metoCLOPramide (REGLAN) injection  10 mg Intravenous Q6H   metoprolol tartrate  12.5 mg Oral BID   multivitamin with minerals  1 tablet Oral Daily   pantoprazole  40 mg Oral Daily   polyethylene glycol  17 g Oral Daily   thiamine  100 mg Oral Daily   Or   thiamine  100 mg Intravenous Daily    PRN meds: bisacodyl, chlorproMAZINE  (THORAZINE) IV, HYDROmorphone (DILAUDID) injection, ondansetron (ZOFRAN) IV, oxyCODONE   Objective: Vitals:   05/13/19 0800 05/13/19 1221  BP: 132/81 103/65  Pulse:  68  Resp: 20 20  Temp: 98.8 F (37.1 C) 98.1 F (36.7 C)  SpO2: 99% 98%    Intake/Output Summary (Last 24 hours) at 05/13/2019 1235 Last data filed at 05/13/2019 0400 Gross per 24 hour  Intake 775 ml  Output --  Net 775 ml   Filed Weights   05/04/19 1530 05/04/19 1548 05/05/19 0311  Weight: 109.6 kg 108.6 kg 109.4 kg   Weight change:  Body mass index is 33.64 kg/m.   Physical Exam General exam: Abdominal pain resolving. skin: No rashes, lesions or ulcers. HEENT: Atraumatic, normocephalic, supple neck, no obvious bleeding Lungs: Clear to auscultation bilaterally CVS: Regular rate and rhythm, no murmur GI/Abd: Soft, mild diffuse tenderness present, bowel sound present, JP drain with serosanguineous fluid. CNS: Alert, awake, oriented x3 Psychiatry: Frustrated mood Extremities: Upper extremity seems to be swelling up, no calf tenderness  Data Review: I have personally reviewed the laboratory data and studies available.  Recent Labs  Lab 05/09/19 0534 05/10/19 0324 05/11/19 0323 05/12/19 0300 05/13/19  0802  WBC 10.6* 8.3 7.4 10.7* 6.4  NEUTROABS 7.5 5.2 4.9 9.0* 4.9  HGB 15.0 13.7 13.3 15.4 13.0  HCT 42.6 39.0 37.8* 44.4 37.3*  MCV 100.5* 100.3* 100.8* 100.5* 101.1*  PLT 179 153 167 209 156   Recent Labs  Lab 05/08/19 0232 05/09/19 0534 05/10/19 0324 05/11/19 0323 05/12/19 0300 05/13/19 0802  NA 133* 133* 134* 133* 133* 134*  K 4.1 3.7 3.4* 4.3 4.7 4.4  CL 98 97* 99 101 105 105  CO2 22 23 25 23  17* 19*  GLUCOSE 117* 93 82 82 112* 75  BUN 46* 34* 23 14 16 13   CREATININE 1.86* 1.53* 1.24 1.15 1.29* 1.20  CALCIUM 9.3 8.7* 8.4* 8.0* 8.5* 7.8*  MG 2.7*  --   --   --   --  2.2  PHOS 4.8*  --   --   --   --  1.8*    Terrilee Croak, MD  [facility] 05/13/2019

## 2019-05-13 NOTE — Progress Notes (Signed)
Patient ID: Randy Beasley, male   DOB: July 29, 1955, 63 y.o.   MRN: [id number]  8 Days Post-Op  Subjective: Complains of no intake for 2 days. Having diarrhea. No vomiting  Objective: Vital signs in last 24 hours: Temp:  [97.5 F (36.4 C)-98.9 F (37.2 C)] 98.8 F (37.1 C) (11/27 0800) Pulse Rate:  [60-97] 63 (11/27 0401) Resp:  [18-20] 20 (11/27 0800) BP: (100-132)/(60-84) 132/81 (11/27 0800) SpO2:  [94 %-99 %] 99 % (11/27 0800) Last BM Date: 05/12/19  Intake/Output from previous day: 11/26 0701 - 11/27 0700 In: 1275 [P.O.:500; I.V.:675; IV Piggyback:100] Out: -  Intake/Output this shift: No intake/output data recorded.  General appearance: alert and cooperative Resp: clear to auscultation bilaterally Cardio: regular rate and rhythm GI: soft, mild tenderness. lots of bruising along lower abd  Lab Results:  Recent Labs    05/12/19 0300 05/13/19 0802  WBC 10.7* 6.4  HGB 15.4 13.0  HCT 44.4 37.3*  PLT 209 156   BMET Recent Labs    05/12/19 0300 05/13/19 0802  NA 133* 134*  K 4.7 4.4  CL 105 105  CO2 17* 19*  GLUCOSE 112* 75  BUN 16 13  CREATININE 1.29* 1.20  CALCIUM 8.5* 7.8*   PT/INR No results for input(s): LABPROT, INR in the last 72 hours. ABG No results for input(s): PHART, HCO3 in the last 72 hours.  Invalid input(s): PCO2, PO2  Studies/Results: Ct Abdomen Pelvis W Contrast  Result Date: 05/12/2019 CLINICAL DATA:  Nausea and vomiting. The patient has a history of laparoscopic cholecystectomy on 05/05/2019. EXAM: CT ABDOMEN AND PELVIS WITH CONTRAST TECHNIQUE: Multidetector CT imaging of the abdomen and pelvis was performed using the standard protocol following bolus administration of intravenous contrast. CONTRAST:  185mL OMNIPAQUE IOHEXOL 300 MG/ML  SOLN COMPARISON:  CT abdomen pelvis dated 04/02/2019 FINDINGS: Lower chest: There is mild right lower lobe atelectasis and minimal left lower lobe atelectasis Hepatobiliary: The liver has a nodular  surface contour, consistent with cirrhosis. No focal liver abnormality is seen. Status post cholecystectomy. There is a gas and fluid collection measuring 6.0 x 3.9 x 5.0 cm surrounding the tip of a surgical drain. An additional gas and fluid collection is seen posterior and medial to this area posterior to the duodenum measuring 3.0 x 1.5 x 2.1 cm. This is not definitely in communication with the collection surrounding the surgical drain. No biliary dilatation or pneumobilia. Pancreas: Unremarkable. No pancreatic ductal dilatation or surrounding inflammatory changes. Spleen: Normal in size without focal abnormality. Adrenals/Urinary Tract: Adrenal glands are unremarkable. Kidneys are normal, without renal calculi, focal lesion, or hydronephrosis. Bladder is unremarkable. Stomach/Bowel: Stomach is within normal limits. No pericecal inflammatory changes to suggest acute appendicitis. There is colonic diverticulosis without evidence of diverticulitis. Multiple loops of small bowel are distended and mildly dilated without a discrete transition point to suggest a high-grade small bowel obstruction. Enteric contrast reaches the midportion of the small bowel we are becomes more dilute. No evidence of bowel wall thickening or inflammatory changes. Vascular/Lymphatic: Aortic atherosclerosis. An enlarged periportal lymph node is likely sequela of the patient's hepatic cirrhosis. No enlarged pelvic lymph nodes. Reproductive: Prostate is unremarkable. Other: There is a small amount of ascites. Fat stranding is seen in the paraumbilical subcutaneous fat which may be postoperative. No abdominal wall hernia is identified. Musculoskeletal: No acute or significant osseous findings. IMPRESSION: 1. Postoperative appearance of cholecystectomy with a gas and fluid collection measuring 6.0 x 3.9 x 5.0 cm surrounding the tip of  a surgical drain in the gallbladder fossa. An additional gas and fluid collection is seen posterior and medial  to this area measuring 3.0 x 1.5 x 2.1 cm. This is not definitely in communication with the collection surrounding the surgical drain. 2. Mildly dilated loops of small bowel without a discrete transition point to suggest high-grade bowel obstruction. This is favored to reflect postoperative ileus. Hepatic cirrhosis. 3. Small volume ascites. Aortic Atherosclerosis (ICD10-I70.0). Electronically Signed   By: Romona Curls M.D.   On: 05/12/2019 14:50   Dg Abd Portable 1v  Result Date: 05/11/2019 CLINICAL DATA:  Abdominal distension EXAM: PORTABLE ABDOMEN - 1 VIEW COMPARISON:  Film from earlier in the same day. FINDINGS: Persistent small bowel dilatation is seen relatively stable from the prior exam. No free air is noted. No acute bony abnormality is noted. No soft tissue mass is seen. IMPRESSION: Persistent increased small bowel dilatation consistent with obstruction. Electronically Signed   By: Alcide Clever M.D.   On: 05/11/2019 23:44   Dg Abd Portable 1v  Result Date: 05/11/2019 CLINICAL DATA:  Nausea and vomiting. EXAM: PORTABLE ABDOMEN - 1 VIEW COMPARISON:  05/07/2019 FINDINGS: Single supine view of the abdomen and pelvis. Progressive gaseous distension of small bowel loops, including at 5.0 cm in the left side of the abdomen. Minimal rectosigmoid gas. No pneumatosis or gross free intraperitoneal air. The upper abdomen is partially excluded. IMPRESSION: Progressive gaseous distension of small bowel loops, suspicious for obstruction. Adynamic ileus felt less likely. Electronically Signed   By: Jeronimo Greaves M.D.   On: 05/11/2019 13:14    Anti-infectives: Anti-infectives (From admission, onward)   Start     Dose/Rate Route Frequency Ordered Stop   05/09/19 1600  Ampicillin-Sulbactam (UNASYN) 3 g in sodium chloride 0.9 % 100 mL IVPB  Status:  Discontinued     3 g 200 mL/hr over 30 Minutes Intravenous Every 6 hours 05/09/19 1237 05/10/19 1142   05/04/19 1530  piperacillin-tazobactam (ZOSYN) IVPB 3.375  g  Status:  Discontinued     3.375 g 12.5 mL/hr over 240 Minutes Intravenous Every 8 hours 05/04/19 1523 05/09/19 1237   05/04/19 0600  piperacillin-tazobactam (ZOSYN) IVPB 3.375 g  Status:  Discontinued     3.375 g 100 mL/hr over 30 Minutes Intravenous Every 8 hours 05/04/19 0228 05/04/19 1522   05/03/19 2200  piperacillin-tazobactam (ZOSYN) IVPB 3.375 g     3.375 g 100 mL/hr over 30 Minutes Intravenous  Once 05/03/19 2152 05/04/19 0011      Assessment/Plan: s/p Procedure(s): LAPAROSCOPIC CHOLECYSTECTOMY Advance diet. Start with clears and go slow Ambulate Hx of STEMI s/p stent to LAD Hx of seizures - has been off antiepileptics for several years with no seizures OA Chronic systolic CHF Hypothyroidism Alcohol abuse with cirrhosis  Tobacco abuse - smokes 3-4 cigarettes daily  Acute cholecystitis S/P laparoscopic cholecystectomy and drain placement, Dr. Magnus Ivan, 11/19 -POD#7 - post-op ileusseemed to be improving, but had a step back last night.   - continue mobilizing - drain looks consistent with serous fluid.  Does not appear bilious.   - Will get CT today given pain, nausea.    FEN:npo x sips/chips.   bowel regimen VTE: SCD's, lovenox DV:VOHYW 11/17>>11/23, unasyn 11/23>>11/24 Foley:none Follow up:[facility]  LOS: 10 days    Chevis Pretty III 05/13/2019

## 2019-05-14 NOTE — Progress Notes (Signed)
Patient ID: Randy Beasley, male   DOB: 1955/10/25, 63 y.o.   MRN: [id number]    9 Days Post-Op  Subjective: Patient passing diarrhea and tolerating clear liquids minimal abdominal pain.  Bloating is much less.  ROS: See above, otherwise other systems negative  Objective: Vital signs in last 24 hours: Temp:  [98.1 F (36.7 C)-99.5 F (37.5 C)] 98.8 F (37.1 C) (11/28 0435) Pulse Rate:  [57-73] 63 (11/28 0435) Resp:  [20] 20 (11/27 1620) BP: (103-145)/(65-91) 113/76 (11/28 0435) SpO2:  [96 %-100 %] 97 % (11/28 0435) Last BM Date: 05/12/19  Intake/Output from previous day: 11/27 0701 - 11/28 0700 In: 1333.7 [P.O.:590; I.V.:543.7; IV Piggyback:200] Out: -  Intake/Output this shift: No intake/output data recorded.  PE: Abd: soft, less distended, minimally tender as expected postoperatively, +BS, incisions c/d/i. JP drain with serous, golden/brown colored output.    Lab Results:  Recent Labs    05/12/19 0300 05/13/19 0802  WBC 10.7* 6.4  HGB 15.4 13.0  HCT 44.4 37.3*  PLT 209 156   BMET Recent Labs    05/12/19 0300 05/13/19 0802  NA 133* 134*  K 4.7 4.4  CL 105 105  CO2 17* 19*  GLUCOSE 112* 75  BUN 16 13  CREATININE 1.29* 1.20  CALCIUM 8.5* 7.8*   PT/INR No results for input(s): LABPROT, INR in the last 72 hours. CMP     Component Value Date/Time   NA 134 (L) 05/13/2019 0802   NA 142 07/28/2017 0847   K 4.4 05/13/2019 0802   CL 105 05/13/2019 0802   CO2 19 (L) 05/13/2019 0802   GLUCOSE 75 05/13/2019 0802   BUN 13 05/13/2019 0802   BUN 17 07/28/2017 0847   CREATININE 1.20 05/13/2019 0802   CREATININE 1.25 12/24/2015 0827   CALCIUM 7.8 (L) 05/13/2019 0802   PROT 5.2 (L) 05/13/2019 0802   ALBUMIN 2.6 (L) 05/13/2019 0802   AST 69 (H) 05/13/2019 0802   ALT 131 (H) 05/13/2019 0802   ALKPHOS 109 05/13/2019 0802   BILITOT 1.7 (H) 05/13/2019 0802   GFRNONAA >60 05/13/2019 0802   GFRAA >60 05/13/2019 0802   Lipase     Component Value Date/Time   LIPASE 68 (H) 05/12/2019 0300       Studies/Results: Ct Abdomen Pelvis W Contrast  Result Date: 05/12/2019 CLINICAL DATA:  Nausea and vomiting. The patient has a history of laparoscopic cholecystectomy on 05/05/2019. EXAM: CT ABDOMEN AND PELVIS WITH CONTRAST TECHNIQUE: Multidetector CT imaging of the abdomen and pelvis was performed using the standard protocol following bolus administration of intravenous contrast. CONTRAST:  114mL OMNIPAQUE IOHEXOL 300 MG/ML  SOLN COMPARISON:  CT abdomen pelvis dated 04/02/2019 FINDINGS: Lower chest: There is mild right lower lobe atelectasis and minimal left lower lobe atelectasis Hepatobiliary: The liver has a nodular surface contour, consistent with cirrhosis. No focal liver abnormality is seen. Status post cholecystectomy. There is a gas and fluid collection measuring 6.0 x 3.9 x 5.0 cm surrounding the tip of a surgical drain. An additional gas and fluid collection is seen posterior and medial to this area posterior to the duodenum measuring 3.0 x 1.5 x 2.1 cm. This is not definitely in communication with the collection surrounding the surgical drain. No biliary dilatation or pneumobilia. Pancreas: Unremarkable. No pancreatic ductal dilatation or surrounding inflammatory changes. Spleen: Normal in size without focal abnormality. Adrenals/Urinary Tract: Adrenal glands are unremarkable. Kidneys are normal, without renal calculi, focal lesion, or hydronephrosis. Bladder is unremarkable. Stomach/Bowel: Stomach is within  normal limits. No pericecal inflammatory changes to suggest acute appendicitis. There is colonic diverticulosis without evidence of diverticulitis. Multiple loops of small bowel are distended and mildly dilated without a discrete transition point to suggest a high-grade small bowel obstruction. Enteric contrast reaches the midportion of the small bowel we are becomes more dilute. No evidence of bowel wall thickening or inflammatory changes.  Vascular/Lymphatic: Aortic atherosclerosis. An enlarged periportal lymph node is likely sequela of the patient's hepatic cirrhosis. No enlarged pelvic lymph nodes. Reproductive: Prostate is unremarkable. Other: There is a small amount of ascites. Fat stranding is seen in the paraumbilical subcutaneous fat which may be postoperative. No abdominal wall hernia is identified. Musculoskeletal: No acute or significant osseous findings. IMPRESSION: 1. Postoperative appearance of cholecystectomy with a gas and fluid collection measuring 6.0 x 3.9 x 5.0 cm surrounding the tip of a surgical drain in the gallbladder fossa. An additional gas and fluid collection is seen posterior and medial to this area measuring 3.0 x 1.5 x 2.1 cm. This is not definitely in communication with the collection surrounding the surgical drain. 2. Mildly dilated loops of small bowel without a discrete transition point to suggest high-grade bowel obstruction. This is favored to reflect postoperative ileus. Hepatic cirrhosis. 3. Small volume ascites. Aortic Atherosclerosis (ICD10-I70.0). Electronically Signed   By: Romona Curls M.D.   On: 05/12/2019 14:50    Anti-infectives: Anti-infectives (From admission, onward)   Start     Dose/Rate Route Frequency Ordered Stop   05/09/19 1600  Ampicillin-Sulbactam (UNASYN) 3 g in sodium chloride 0.9 % 100 mL IVPB  Status:  Discontinued     3 g 200 mL/hr over 30 Minutes Intravenous Every 6 hours 05/09/19 1237 05/10/19 1142   05/04/19 1530  piperacillin-tazobactam (ZOSYN) IVPB 3.375 g  Status:  Discontinued     3.375 g 12.5 mL/hr over 240 Minutes Intravenous Every 8 hours 05/04/19 1523 05/09/19 1237   05/04/19 0600  piperacillin-tazobactam (ZOSYN) IVPB 3.375 g  Status:  Discontinued     3.375 g 100 mL/hr over 30 Minutes Intravenous Every 8 hours 05/04/19 0228 05/04/19 1522   05/03/19 2200  piperacillin-tazobactam (ZOSYN) IVPB 3.375 g     3.375 g 100 mL/hr over 30 Minutes Intravenous  Once 05/03/19  2152 05/04/19 0011       Assessment/Plan Ambulate Hx of STEMI s/p stent to LAD Hx of seizures - has been off antiepileptics for several years with no seizures OA Chronic systolic CHF Hypothyroidism Alcohol abuse with cirrhosis  Tobacco abuse - smokes 3-4 cigarettes daily  POD 9, S/P laparoscopic cholecystectomy and drain placement, Dr. Magnus Ivan, 11/19 for acute cholecystitis - post-op ileusseemed to be improving -tolerating CLD -adv to full liquids today. -CT reviewed.  Some fluid in abdomen, but also has ascites.  Given resolving ileus, normal WBC, and AF, would note recommend any further abx therapy.  FEN:FLD VTE: SCD's, lovenox GD:JMEQA 11/17>>11/23, unasyn 11/23>>11/24 Foley:none Follow up:[facility]   LOS: 11 days    Letha Cape , [facility] 05/14/2019, 12:11 PM Please see Amion for pager number during day hours 7:00am-4:30pm

## 2019-05-14 NOTE — Progress Notes (Signed)
PROGRESS NOTE  EVANDER MACARAEG  DOB: 11/17/1955  PCP: Ivan Anchors, MD YSA:[id number]  DOA: 05/03/2019  LOS: 11 days   Chief Complaint  Patient presents with  . Chest Pain   Brief narrative: Randy Beasley is a 63 y.o. male with PMH of HTN, CAD, MI s/p stent, CHF, ischemic cardiomyopathy EF 25 to 30%, hypothyroidism, seizures, hepatic steatosis. Patient presented to the ED on 05/03/2019 with complaint of abdominal pain radiating to the chest started after dinner, seated with nausea and bilious emesis.  In the ED, patient was afebrile and hemodynamically stable.  He had tenderness in the right upper quadrant. Work-up showed WBC 11.7, potassium 3.8, BUN/creatinine 19/1.91, AST/ALT/bilirubin elevated to 109/127/2.1  CT angio of the chest and abdomen showed: 1. Findings suspicious of acute cholecystitis which include abnormal gallbladder with stones/sludge, evidence of pericholecystic inflammation, and superimposed pneumobilia. 2. Possible hepatic Cirrhosis, but no stigmata of portal venous hypertension. Extensive calcified coronary artery atherosclerosis and/or stent.   Patient was admitted under hospitalist medicine service for acute cholecystitis.  IV Zosyn was started.  -11/19, patient underwent laparoscopic cholecystectomy and drain placement by Dr. Ninfa Linden. His hospital course has been complicated by prolonged postoperative ileus.  Subjective: Patient was seen and examined this morning. Sitting up in chair.  Not in distress.  No nausea, vomiting, abdominal pain.  Had bowel movement this morning again.   Patient is on clear liquid diet.  Noted a plan from general surgery to advance to full liquid diet.  Assessment/Plan: Acute cholecystitis  -Presented with abdominal pain, nausea, vomiting.  Positive HIDA scan -11/19, patient underwent laparoscopic cholecystectomy and drain placement by Dr. Ninfa Linden. -Per surgery, patient will be discharged with JP drain.  Postoperative  ileus -Patient's hospital course was complicated by prolonged postoperative ileus.   -11/25, patient was made ready for discharge but he vomited a large amount of liquid.  Subsequent abdominal x-ray and CT scan showed postoperative ileus.  His diet was scaled back.  Slowly resumed again.  Currently on clear liquid diet.  Advance to full liquid today by general surgery.  If able to tolerate, patient would like to get discharged tomorrow.    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 No evidence of portal hypertension at this time. -Likely related to hepatic steatosis as well as alcohol use. -Liver enzymes fluctuating.  Chronically somewhat elevated.   -Patient will follow-up with GI as an outpatient.  Acute kidney injury on CKD stage 3a -Baseline creatinine 1.3, CKD stage 3a -Creatinine peaked at 1.86 on 11/22. Improved to normal.  Lasix remains on hold. -Remains on gentle hydration.  Since he is back on oral hydration now, I will scale back normal saline to 25 mL/h only.  History of systolic congestive heart failure -Echocardiogram 11/18 showed EF of 40 to 45% which is an improvement compared to previous. -Does not seem decompensated at this time.   -Home meds include Lasix, metoprolol, ramipril.  Continue metoprolol.  Lasix and ramipril on hold.  Probably can be resumed at discharge.  Coronary artery disease/history of NSTEMI status post PCI. -No chest pain. -Cardiac enzymes negative. -On aspirin 81 mg daily at home.  Hypothyroidism -continue Synthroid   Depression - continue BuSpar.    Mobility: Encourage ambulation DVT prophylaxis:  Subcu Lovenox Code Status:   Code Status: Full Code  Family Communication:  Not at bedside Expected Discharge:  Hopefully discharge to home  tomorrow Consultants:  Cardiology, general surgery  Procedures:  None  Antimicrobials:  Anti-infectives (From admission, onward)   Start     Dose/Rate Route Frequency Ordered Stop   05/09/19 1600  Ampicillin-Sulbactam (UNASYN) 3 g in sodium chloride 0.9 % 100 mL IVPB  Status:  Discontinued     3 g 200 mL/hr over 30 Minutes Intravenous Every 6 hours 05/09/19 1237 05/10/19 1142   05/04/19 1530  piperacillin-tazobactam (ZOSYN) IVPB 3.375 g  Status:  Discontinued     3.375 g 12.5 mL/hr over 240 Minutes Intravenous Every 8 hours 05/04/19 1523 05/09/19 1237   05/04/19 0600  piperacillin-tazobactam (ZOSYN) IVPB 3.375 g  Status:  Discontinued     3.375 g 100 mL/hr over 30 Minutes Intravenous Every 8 hours 05/04/19 0228 05/04/19 1522   05/03/19 2200  piperacillin-tazobactam (ZOSYN) IVPB 3.375 g     3.375 g 100 mL/hr over 30 Minutes Intravenous  Once 05/03/19 2152 05/04/19 0011      Diet Order            Diet full liquid Room service appropriate? Yes; Fluid consistency: Thin  Diet effective now              Infusions:  . sodium chloride 75 mL/hr at 05/14/19 1307  . chlorproMAZINE (THORAZINE) IV    . lactated ringers Stopped (05/11/19 0827)    Scheduled Meds: . aspirin  81 mg Oral Daily  . cholecalciferol  1,000 Units Oral Daily  . docusate sodium  100 mg Oral BID  . enoxaparin (LOVENOX) injection  40 mg Subcutaneous Q24H  . folic acid  1 mg Oral Daily  . gabapentin  300 mg Oral TID  . levothyroxine  125 mcg Oral Q0600  . methocarbamol  500 mg Oral TID  . metoCLOPramide (REGLAN) injection  10 mg Intravenous Q6H  . metoprolol tartrate  12.5 mg Oral BID  . multivitamin with minerals  1 tablet Oral Daily  . pantoprazole  40 mg Oral Daily  . polyethylene glycol  17 g Oral Daily  . thiamine  100 mg Oral Daily   Or  . thiamine  100 mg Intravenous Daily    PRN meds: bisacodyl, chlorproMAZINE (THORAZINE) IV, HYDROmorphone (DILAUDID) injection, ondansetron (ZOFRAN) IV, oxyCODONE   Objective: Vitals:   05/14/19 0028 05/14/19 0435  BP: 106/68 113/76  Pulse: (!) 57  63  Resp:    Temp: 98.5 F (36.9 C) 98.8 F (37.1 C)  SpO2: 96% 97%    Intake/Output Summary (Last 24 hours) at 05/14/2019 1323 Last data filed at 05/13/2019 1824 Gross per 24 hour  Intake 1153.71 ml  Output -  Net 1153.71 ml   Filed Weights   05/04/19 1530 05/04/19 1548 05/05/19 0311  Weight: 109.6 kg 108.6 kg 109.4 kg   Weight change:  Body mass index is 33.64 kg/m.   Physical Exam General exam: Sitting up in chair.  Not in distress skin: No rashes, lesions or ulcers. HEENT: Atraumatic, normocephalic, supple neck, no obvious bleeding Lungs: Clear to auscultation bilaterally CVS: Regular rate and rhythm, no murmur GI/Abd: Soft, improved tenderness, bowel sound present, JP drain with serosanguineous fluid. CNS: Alert, awake, oriented x3 Psychiatry: Normal mood Extremities: No evidence of edema or calf tenderness.  Data Review: I have personally reviewed the laboratory data and studies available.  Recent Labs  Lab 05/09/19 0534 05/10/19 0324 05/11/19 0323 05/12/19 0300 05/13/19 0802  WBC 10.6* 8.3 7.4 10.7* 6.4  NEUTROABS 7.5 5.2 4.9 9.0* 4.9  HGB 15.0 13.7  13.3 15.4 13.0  HCT 42.6 39.0 37.8* 44.4 37.3*  MCV 100.5* 100.3* 100.8* 100.5* 101.1*  PLT 179 153 167 209 156   Recent Labs  Lab 05/08/19 0232 05/09/19 0534 05/10/19 0324 05/11/19 0323 05/12/19 0300 05/13/19 0802  NA 133* 133* 134* 133* 133* 134*  K 4.1 3.7 3.4* 4.3 4.7 4.4  CL 98 97* 99 101 105 105  CO2 22 23 25 23  17* 19*  GLUCOSE 117* 93 82 82 112* 75  BUN 46* 34* 23 14 16 13   CREATININE 1.86* 1.53* 1.24 1.15 1.29* 1.20  CALCIUM 9.3 8.7* 8.4* 8.0* 8.5* 7.8*  MG 2.7*  --   --   --   --  2.2  PHOS 4.8*  --   --   --   --  1.8*    , MD  [facility] 05/14/2019

## 2019-05-15 LAB — COMPREHENSIVE METABOLIC PANEL
ALT: 96 U/L — ABNORMAL HIGH (ref 0–44)
AST: 53 U/L — ABNORMAL HIGH (ref 15–41)
Albumin: 2.6 g/dL — ABNORMAL LOW (ref 3.5–5.0)
Alkaline Phosphatase: 132 U/L — ABNORMAL HIGH (ref 38–126)
Anion gap: 9 (ref 5–15)
BUN: 6 mg/dL — ABNORMAL LOW (ref 8–23)
CO2: 20 mmol/L — ABNORMAL LOW (ref 22–32)
Calcium: 8.1 mg/dL — ABNORMAL LOW (ref 8.9–10.3)
Chloride: 107 mmol/L (ref 98–111)
Creatinine, Ser: 1.03 mg/dL (ref 0.61–1.24)
GFR calc Af Amer: >60 mL/min (ref >60)
GFR calc non Af Amer: >60 mL/min (ref >60)
Glucose, Bld: 106 mg/dL — ABNORMAL HIGH (ref 70–99)
Potassium: 4 mmol/L (ref 3.5–5.1)
Sodium: 136 mmol/L (ref 135–145)
Total Bilirubin: 1.3 mg/dL — ABNORMAL HIGH (ref 0.3–1.2)
Total Protein: 5.5 g/dL — ABNORMAL LOW (ref 6.5–8.1)

## 2019-05-15 LAB — CBC WITH DIFFERENTIAL/PLATELET
Abs Immature Granulocytes: 0.05 10*3/uL (ref 0.00–0.07)
Basophils Absolute: 0 10*3/uL (ref 0.0–0.1)
Basophils Relative: 0 %
Eosinophils Absolute: 0.1 10*3/uL (ref 0.0–0.5)
Eosinophils Relative: 2 %
HCT: 40.9 % (ref 39.0–52.0)
Hemoglobin: 14.1 g/dL (ref 13.0–17.0)
Immature Granulocytes: 1 %
Lymphocytes Relative: 15 %
Lymphs Abs: 1.2 10*3/uL (ref 0.7–4.0)
MCH: 35.1 pg — ABNORMAL HIGH (ref 26.0–34.0)
MCHC: 34.5 g/dL (ref 30.0–36.0)
MCV: 101.7 fL — ABNORMAL HIGH (ref 80.0–100.0)
Monocytes Absolute: 0.4 10*3/uL (ref 0.1–1.0)
Monocytes Relative: 5 %
Neutro Abs: 6.2 10*3/uL (ref 1.7–7.7)
Neutrophils Relative %: 77 %
Platelets: 195 10*3/uL (ref 150–400)
RBC: 4.02 MIL/uL — ABNORMAL LOW (ref 4.22–5.81)
RDW: 12.3 % (ref 11.5–15.5)
WBC: 7.9 10*3/uL (ref 4.0–10.5)
nRBC: 0 % (ref 0.0–0.2)

## 2019-05-15 LAB — PHOSPHORUS: Phosphorus: 2.8 mg/dL (ref 2.5–4.6)

## 2019-05-15 LAB — MAGNESIUM: Magnesium: 2.3 mg/dL (ref 1.7–2.4)

## 2019-05-15 MED ORDER — METHOCARBAMOL 500 MG PO TABS: 500.0000 mg | ORAL_TABLET | Freq: Three times a day (TID) | ORAL | 0 refills | Status: DC

## 2019-05-15 MED ORDER — GABAPENTIN 300 MG PO CAPS: 300.0000 mg | ORAL_CAPSULE | Freq: Three times a day (TID) | ORAL | 0 refills | Status: DC

## 2019-05-15 MED ORDER — THIAMINE HCL 100 MG PO TABS: 100.0000 mg | ORAL_TABLET | Freq: Every day | ORAL | 0 refills | Status: DC

## 2019-05-15 MED ORDER — PANTOPRAZOLE SODIUM 40 MG PO TBEC: 40.0000 mg | DELAYED_RELEASE_TABLET | Freq: Every day | ORAL | 0 refills | Status: DC

## 2019-05-15 MED ORDER — OXYCODONE HCL 5 MG PO TABS: 5.0000 mg | ORAL_TABLET | ORAL | 0 refills | Status: DC | PRN

## 2019-05-15 MED ORDER — FOLIC ACID 1 MG PO TABS: 1.0000 mg | ORAL_TABLET | Freq: Every day | ORAL | 0 refills | Status: DC

## 2019-05-15 NOTE — Progress Notes (Signed)
Rojelio Brenner to be D/C'd home per MD order. Discussed with the patient and all questions fully answered. VVS, Skin clean and dry without evidence of skin break down, no evidence of skin tears noted.  IV catheter discontinued intact. Site without signs and symptoms of complications. Dressing and pressure applied. JP drain removed by surgery. An After Visit Summary was printed and given to the patient.  Patient escorted via [place], and D/C home via private auto.  Melonie Florida  05/15/2019 2:53 PM

## 2019-05-15 NOTE — Discharge Summary (Signed)
Physician Discharge Summary  Randy Beasley ZOX:[id number] DOB: [birth year] DOA: 05/03/2019  PCP: Pearson Forster, MD  Admit date: 05/03/2019 Discharge date: 05/15/2019  Admitted From: Home Disposition: Home  Recommendations for Outpatient Follow-up:  1. Follow up with PCP in 1-2 weeks 2. Please obtain BMP/CBC in one week 3. Follow up General surgery as scheduled. 4. Advance diet as tolerated, patient wanted to go home. 5. JP darin has to be removed before patient can be discharged. 6. Please follow up on the following pending results:  Home Health: No Equipment/Devices: No  Discharge Condition: Stable CODE STATUS:Full code Diet recommendation: Heart Healthy / Carb Modified / Regular / Dysphagia   Brief/Interim Summary: Randy Beasley is a 63 y.o. male with PMH of HTN, CAD, MI s/p stent, CHF, ischemic cardiomyopathy EF 25 to 30%, hypothyroidism, seizures, hepatic steatosis. presented to the ED on 05/03/2019 with complaint of abdominal pain radiating to the chest started after dinner, associated with nausea and bilious emesis. In the ED, patient was afebrile and hemodynamically stable.  He had tenderness in the right upper quadrant. Work-up showed WBC 11.7, potassium 3.8, BUN/creatinine 19/1.91, AST/ALT/bilirubin elevated to 109/127/2.1 CT angio of the chest and abdomen showed: 1. Findings suspicious of acute cholecystitis which include abnormal gallbladder with stones/sludge, evidence of pericholecystic inflammation, and superimposed pneumobilia. 2. Possible hepatic Cirrhosis, but no stigmata of portal venous hypertension. Extensive calcified coronary artery atherosclerosis and/or stent.   Hospital course. Patient was admitted under hospitalist service for acute cholecystitis.  IV Zosyn was started.  -11/19, patient underwent laparoscopic cholecystectomy and drain placement by Dr. Magnus Ivan. His hospital course has been complicated by prolonged postoperative ileus.  He was  managed for below problems.  Acute cholecystitis  -Presented with abdominal pain, nausea, vomiting.  Positive HIDA scan -11/19, patient underwent laparoscopic cholecystectomy and drain placement by Dr. Magnus Ivan. -His hospital course has been complicated by prolonged postoperative ileus.  Postoperative ileus -11/25, patient was made ready for discharge but he vomited a large amount of liquid.   Subsequent abdominal x-ray and CT scan showed postoperative ileus.  His diet was scaled back.   Diet resumed slowly. Patient tolerated full liquid and soft diet.  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 No evidence of portal hypertension at this time. -Likely related to hepatic steatosis as well as alcohol use. -Liver enzymes fluctuating.  Chronically somewhat elevated.   -Patient will follow-up with GI as an outpatient.  Acute kidney injury on CKD stage 3a -Baseline creatinine 1.3, CKD stage 3a -Creatinine peaked at 1.86 on 11/22. Improved to normal.  Lasix remains on hold. -Remained on gentle hydration.  Resume lasix after discharge.  History of systolic congestive heart failure -Echocardiogram 11/18 showed EF of 40 to 45% which is an improvement compared to previous. -Does not seem decompensated at this time.   -Home meds include Lasix, metoprolol, ramipril.  Continue metoprolol.  Lasix and ramipril resumed at discharge.  Coronary artery disease/history of NSTEMI status post PCI. -No chest pain. -Cardiac enzymes negative. -On aspirin 81 mg daily at home.  Hypothyroidism -continue Synthroid   Depression - continue BuSpar.    Consultants:  Cardiology, general surgery  Discharge Diagnoses:  Active Problems:   Acute emphysematous cholecystitis    Discharge Instructions  Discharge Instructions    Call MD for:  extreme fatigue   Complete by: As directed     Call MD for:  persistant  nausea and vomiting   Complete by: As directed    Call MD for:  redness, tenderness, or signs of infection (pain, swelling, redness, odor or green/yellow discharge around incision site)   Complete by: As directed    Call MD for:  severe uncontrolled pain   Complete by: As directed    Call MD for:  temperature >100.4   Complete by: As directed    Diet - low sodium heart healthy   Complete by: As directed    Advance as tolerated.   Discharge instructions   Complete by: As directed    Increase activity slowly   Complete by: As directed      Allergies as of 05/15/2019   No Known Allergies     Medication List    STOP taking these medications   TYLENOL PO     TAKE these medications   aspirin 81 MG tablet Take 81 mg by mouth daily.   busPIRone 10 MG tablet Commonly known as: BUSPAR Take 10 mg by mouth 3 (three) times daily as needed. Anxiety   folic acid 1 MG tablet Commonly known as: FOLVITE Take 1 tablet (1 mg total) by mouth daily. Start taking on: May 16, 2019   furosemide 20 MG tablet Commonly known as: LASIX TAKE 1 TABLET BY MOUTH EVERY DAY   gabapentin 300 MG capsule Commonly known as: NEURONTIN Take 1 capsule (300 mg total) by mouth 3 (three) times daily.   levothyroxine 125 MCG tablet Commonly known as: SYNTHROID Take 125 mcg by mouth daily.   methocarbamol 500 MG tablet Commonly known as: ROBAXIN Take 1 tablet (500 mg total) by mouth 3 (three) times daily.   metoprolol tartrate 25 MG tablet Commonly known as: LOPRESSOR TAKE ONE-HALF TABLET BY MOUTH TWICE DAILY What changed:   how much to take  how to take this  when to take this  additional instructions   nitroGLYCERIN 0.4 MG SL tablet Commonly known as: NITROSTAT Place 1 tablet (0.4 mg total) under the tongue every 5 (five) minutes x 3 doses as needed for chest pain.   oxyCODONE 5 MG immediate release tablet Commonly known as: Oxy IR/ROXICODONE Take 1-2  tablets (5-10 mg total) by mouth every 4 (four) hours as needed for moderate pain or severe pain.   pantoprazole 40 MG tablet Commonly known as: PROTONIX Take 1 tablet (40 mg total) by mouth daily. Start taking on: May 16, 2019   ramipril 5 MG capsule Commonly known as: ALTACE TAKE 1 CAPSULE BY MOUTH EVERY DAY What changed: how much to take   thiamine 100 MG tablet Take 1 tablet (100 mg total) by mouth daily. Start taking on: May 16, 2019   Vitamin D (Cholecalciferol) 25 MCG (1000 UT) Tabs Take 1 tablet by mouth daily.      Follow-up Information    Sur[facility], [place]. Go on 05/24/2019.   Specialty: General Surgery Why: Follow up appointment scheduled for 10:00 AM. Arrive 15 minutes early to check in Contact information: [address] [place] [address] [phone]        [facility], [place]. Go on 05/17/2019.   Specialty: General Surgery Why: Your appointment is 12/1 at 1:30pm Please arrive 30 min prior to appointment time. Bring photo ID and insurance information.  Contact information: [address] [place] [address] [phone]       Pearson Forster, MD Follow up in 1 week(s).   Specialty: Family Medicine Contact  information: [address] [place] [address] [phone]        [place], Peter M, MD .   Specialty: Cardiology Contact information: [address] [place] [address] [phone]          No Known Allergies  Consultations:  General Surgery.   Procedures/Studies: Nm Hepatobiliary Liver Func  Result Date: 05/04/2019 CLINICAL DATA:  Cholelithiasis. EXAM: NUCLEAR MEDICINE HEPATOBILIARY IMAGING TECHNIQUE: Sequential images of the abdomen were obtained out to 60 minutes following intravenous administration of radiopharmaceutical. Patient was additionally administered 3 mg morphine and subsequent images were obtained after 30 minutes.  RADIOPHARMACEUTICALS:  5.1 mCi Tc-73m  Choletec IV COMPARISON:  Same-day ultrasound and CT angiography of the chest, abdomen pelvis. FINDINGS: Prompt uptake and biliary excretion of activity by the liver is seen. Biliary activity passes into small bowel, consistent with patent common bile duct. There is nonvisualization of activity within the gallbladder both in the initial 60 minutes of imaging and 30 minute imaging following morphine administration. IMPRESSION: Absent visualization of the gallbladder is compatible with acute cholecystitis and/or cystic duct obstruction given the comparison imaging findings. Electronically Signed   By: Kreg Shropshire M.D.   On: 05/04/2019 14:30   Ct Abdomen Pelvis W Contrast  Result Date: 05/12/2019 CLINICAL DATA:  Nausea and vomiting. The patient has a history of laparoscopic cholecystectomy on 05/05/2019. EXAM: CT ABDOMEN AND PELVIS WITH CONTRAST TECHNIQUE: Multidetector CT imaging of the abdomen and pelvis was performed using the standard protocol following bolus administration of intravenous contrast. CONTRAST:  OMNIPAQUE IOHEXOL 300 MG/ML  SOLN COMPARISON:  CT abdomen pelvis dated 04/02/2019 FINDINGS: Lower chest: There is mild right lower lobe atelectasis and minimal left lower lobe atelectasis Hepatobiliary: The liver has a nodular surface contour, consistent with cirrhosis. No focal liver abnormality is seen. Status post cholecystectomy. There is a gas and fluid collection measuring 6.0 x 3.9 x 5.0 cm surrounding the tip of a surgical drain. An additional gas and fluid collection is seen posterior and medial to this area posterior to the duodenum measuring 3.0 x 1.5 x 2.1 cm. This is not definitely in communication with the collection surrounding the surgical drain. No biliary dilatation or pneumobilia. Pancreas: Unremarkable. No pancreatic ductal dilatation or surrounding inflammatory changes. Spleen: Normal in size without focal abnormality. Adrenals/Urinary  Tract: Adrenal glands are unremarkable. Kidneys are normal, without renal calculi, focal lesion, or hydronephrosis. Bladder is unremarkable. Stomach/Bowel: Stomach is within normal limits. No pericecal inflammatory changes to suggest acute appendicitis. There is colonic diverticulosis without evidence of diverticulitis. Multiple loops of small bowel are distended and mildly dilated without a discrete transition point to suggest a high-grade small bowel obstruction. Enteric contrast reaches the midportion of the small bowel we are becomes more dilute. No evidence of bowel wall thickening or inflammatory changes. Vascular/Lymphatic: Aortic atherosclerosis. An enlarged periportal lymph node is likely sequela of the patient's hepatic cirrhosis. No enlarged pelvic lymph nodes. Reproductive: Prostate is unremarkable. Other: There is a small amount of ascites. Fat stranding is seen in the paraumbilical subcutaneous fat which may be postoperative. No abdominal wall hernia is identified. Musculoskeletal: No acute or significant osseous findings. IMPRESSION: 1. Postoperative appearance of cholecystectomy with a gas and fluid collection measuring 6.0 x 3.9 x 5.0 cm surrounding the tip of a surgical drain in the gallbladder fossa. An additional gas and fluid collection is seen posterior and medial to this area measuring 3.0 x 1.5 x 2.1 cm. This is not definitely in communication with  the collection surrounding the surgical drain. 2. Mildly dilated loops of small bowel without a discrete transition point to suggest high-grade bowel obstruction. This is favored to reflect postoperative ileus. Hepatic cirrhosis. 3. Small volume ascites. Aortic Atherosclerosis (ICD10-I70.0). Electronically Signed   By: Zerita Boers M.D.   On: 05/12/2019 14:50   Dg Chest Portable 1 View  Result Date: 05/03/2019 CLINICAL DATA:  Chest pain EXAM: PORTABLE CHEST 1 VIEW COMPARISON:  05/01/2014 FINDINGS: No focal consolidation or effusion.  Borderline cardiomegaly. No pneumothorax. IMPRESSION: No active disease.  Borderline cardiomegaly Electronically Signed   By: Donavan Foil M.D.   On: 05/03/2019 20:42   Dg Abd Portable 1v  Result Date: 05/11/2019 CLINICAL DATA:  Abdominal distension EXAM: PORTABLE ABDOMEN - 1 VIEW COMPARISON:  Film from earlier in the same day. FINDINGS: Persistent small bowel dilatation is seen relatively stable from the prior exam. No free air is noted. No acute bony abnormality is noted. No soft tissue mass is seen. IMPRESSION: Persistent increased small bowel dilatation consistent with obstruction. Electronically Signed   By: Inez Catalina M.D.   On: 05/11/2019 23:44   Dg Abd Portable 1v  Result Date: 05/11/2019 CLINICAL DATA:  Nausea and vomiting. EXAM: PORTABLE ABDOMEN - 1 VIEW COMPARISON:  05/07/2019 FINDINGS: Single supine view of the abdomen and pelvis. Progressive gaseous distension of small bowel loops, including at 5.0 cm in the left side of the abdomen. Minimal rectosigmoid gas. No pneumatosis or gross free intraperitoneal air. The upper abdomen is partially excluded. IMPRESSION: Progressive gaseous distension of small bowel loops, suspicious for obstruction. Adynamic ileus felt less likely. Electronically Signed   By: Abigail Miyamoto M.D.   On: 05/11/2019 13:14   Dg Abd Portable 1v  Result Date: 05/07/2019 CLINICAL DATA:  Nausea EXAM: PORTABLE ABDOMEN - 1 VIEW COMPARISON:  CT abdomen pelvis dated 05/03/2019 FINDINGS: Multiple dilated gas-filled loops of small bowel are seen throughout the abdomen. Air-fluid levels and free intraperitoneal air cannot be excluded on the supine exam. Cholecystectomy clips overlie the gallbladder fossa. IMPRESSION: Multiple dilated loops of bowel throughout the abdomen. Air-fluid levels cannot be evaluated on the supine exam and these findings may reflect ileus or small-bowel obstruction. Electronically Signed   By: Zerita Boers M.D.   On: 05/07/2019 11:43   Ct Angio  Chest/abd/pel For Dissection W And/or Wo Contrast  Addendum Date: 05/03/2019   ADDENDUM REPORT: 05/03/2019 21:57 ADDENDUM: Study discussed by telephone with Dr. Darl Householder in the ED on 05/03/2019 at 2151 hours. Electronically Signed   By: Genevie Ann M.D.   On: 05/03/2019 21:57   Result Date: 05/03/2019 CLINICAL DATA:  63 year old male with chest pain radiating to the back and abdomen since 1800 hours. EXAM: CT ANGIOGRAPHY CHEST, ABDOMEN AND PELVIS TECHNIQUE: Multidetector CT imaging through the chest, abdomen and pelvis was performed using the standard protocol during bolus administration of intravenous contrast. Multiplanar reconstructed images and MIPs were obtained and reviewed to evaluate the vascular anatomy. CONTRAST:  72mL OMNIPAQUE IOHEXOL 350 MG/ML SOLN COMPARISON:  Portable chest earlier today. FINDINGS: CTA CHEST FINDINGS Cardiovascular: Extensive calcified coronary artery atherosclerosis and/or stent on series 5, image 32. But there is minimal calcified plaque of the thoracic aorta. Negative for thoracic aortic aneurysm or dissection. Cardiac size at the upper limits of normal. No pericardial effusion. Mediastinum/Nodes: Negative. Lungs/Pleura: Major airways are patent. Trace apical scarring and paraseptal emphysema. There are 2 small subpleural and possibly sub solid right lung nodules on series 7, images 56 and 61, 3-4 millimeters.  Otherwise the lungs are clear. No pleural effusion. Musculoskeletal: No acute osseous abnormality identified. Review of the MIP images confirms the above findings. CTA ABDOMEN AND PELVIS FINDINGS VASCULAR Aortoiliac calcified atherosclerosis. Ectatic infrarenal abdominal aorta up to 26 millimeters diameter, but no aortic aneurysm or dissection. Mild ectasia also of the bilateral iliac arteries which are patent. Visible proximal femoral arteries remain patent. The major branches of the abdominal aorta are patent, with only mild atherosclerosis Review of the MIP images confirms  the above findings. NON-VASCULAR Hepatobiliary: There is pneumobilia in the liver and in the CBD. And there is also a moderate volume of gas within the lumen of the gallbladder, non dependent on series 6, image 158. No gas identified within the wall of the gallbladder. Irregular stones, sludge, and debris also within the gallbladder lumen. And on coronal image 55 and series 6, image 160 there is evidence of some pericholecystic inflammation. The wall at the fundus appears normal where it is distended by gas. The liver contour appears nodular. No discrete liver lesion. Questionable hyperenhancement along the gallbladder fossa. Pancreas: Negative. Spleen: Diminutive, negative. Adrenals/Urinary Tract: Normal adrenal glands. Bilateral renal enhancement is symmetric and within normal limits. No hydronephrosis. Decompressed ureters. Unremarkable urinary bladder. No nephrolithiasis identified. Stomach/Bowel: Decompressed and negative rectum. Redundant sigmoid colon with extensive diverticulosis in the proximal segment but no active inflammation identified. Lesser diverticulosis throughout the decompressed descending colon with no active inflammation. Occasional diverticula also in the transverse colon. The right colon is on a lax mesentery. No large bowel inflammation identified. The cecum is in the right upper quadrant. The appendix is diminutive or absent. Negative terminal ileum. No dilated small bowel. Negative stomach. Small duodenal diverticulum on coronal image 83. No inflammation. No pneumoperitoneum. No free fluid. Lymphatic: No lymphadenopathy. Reproductive: Negative. Other: No pelvic free fluid. Musculoskeletal: No acute osseous abnormality identified. Review of the MIP images confirms the above findings. IMPRESSION: 1. Negative for aortic dissection or aneurysm, but positive for ectatic abdominal aorta at risk for aneurysm development. Recommend followup by Ultrasound in 5 years. This recommendation follows ACR  consensus guidelines: White Paper of the ACR Incidental Findings Committee II on Vascular Findings. J Am Coll Radiol 2013; 10:789-794. Aortic aneurysm NOS (ICD10-I71.9). 2. Abnormal gallbladder with stones/sludge, evidence of pericholecystic inflammation, and superimposed pneumobilia. Gas within the lumen of the gallbladder, but not identified within the gallbladder wall. The appearance is suspicious for Acute Cholecystitis, and if the patient is toxic then consider an atypical appearance of Emphysematous Cholecystitis. Recommend Surgery consultation. 3. No other acute or inflammatory process identified in the chest, abdomen, or pelvis. 4. Possible hepatic Cirrhosis, but no stigmata of portal venous hypertension. Extensive calcified coronary artery atherosclerosis and/or stent. Diverticulosis of the large bowel. 5. Two small 3-4 mm subpleural right lung nodules are likely postinflammatory. No follow-up needed if patient is low-risk (and has no known or suspected primary neoplasm). Non-contrast chest CT can be considered in 12 months if patient is high-risk. This recommendation follows the consensus statement: Guidelines for Management of Incidental Pulmonary Nodules Detected on CT Images: From the Fleischner Society 2017; Radiology 2017; 284:228-243. Electronically Signed: By: Odessa Fleming M.D. On: 05/03/2019 21:48   US Abdomen Limited Ruq  Result Date: 05/03/2019 CLINICAL DATA:  63 year old male with abnormal gallbladder on CTA Chest, Abdomen, and Pelvis today. EXAM: ULTRASOUND ABDOMEN LIMITED RIGHT UPPER QUADRANT COMPARISON:  CTA chest, Abdomen, and Pelvis today are reported separately. FINDINGS: Gallbladder: 21 millimeter shadowing echogenic gallstone redemonstrated. Additional shadowing likely due to  the gallbladder air seen by CT earlier today. Gallbladder wall thickness 6 millimeters. No pericholecystic fluid is evident. No sonographic Eulah Pont sign was elicited. Common bile duct: Diameter: 3 millimeters,  normal. Liver: Nodular liver contour (image 18). Coarse hepatic echotexture. No discrete liver lesion. Background echogenicity within normal limits (image 26). Portal vein is patent on color Doppler imaging with normal direction of blood flow towards the liver. Other: Negative visible right kidney. IMPRESSION: 1. Abnormal gallbladder with stones, wall thickening, and evidence of the gas seen by CT. But no sonographic Murphy sign was elicited. 2. No bile duct dilatation. 3. Nodular liver contour again suggestive of Cirrhosis. Electronically Signed   By: Odessa Fleming M.D.   On: 05/03/2019 23:12    (Echo,  Laproscopic cholecystectomy.)   Subjective: Patient was seen and examined at bedside,  he denies any abdominal pain,  he tolerated full liquid diet,  wants to go home.   Discharge Exam: Vitals:   05/15/19 1230 05/15/19 1231  BP: 108/76 108/78  Pulse: 84 84  Resp:  (!) 21  Temp: 97.6 F (36.4 C) 97.6 F (36.4 C)  SpO2: 95% 95%   Vitals:   05/15/19 0045 05/15/19 0412 05/15/19 1230 05/15/19 1231  BP: 102/71 110/81 108/76 108/78  Pulse: 60 72 84 84  Resp:    (!) 21  Temp: 98.3 F (36.8 C) 98.1 F (36.7 C) 97.6 F (36.4 C) 97.6 F (36.4 C)  TempSrc: Oral Oral Oral Oral  SpO2: 95% 96% 95% 95%  Weight:      Height:        General: Pt is alert, awake, not in acute distress Cardiovascular: RRR, S1/S2 +, no rubs, no gallops Respiratory: CTA bilaterally, no wheezing, no rhonchi Abdominal: Soft, NT, ND, bowel sounds + Extremities: no edema, no cyanosis    The results of significant diagnostics from this hospitalization (including imaging, microbiology, ancillary and laboratory) are listed below for reference.     Microbiology: Recent Results (from the past 240 hour(s))  Culture, blood (routine x 2)     Status: None   Collection Time: 05/07/19  8:46 AM   Specimen: BLOOD LEFT HAND  Result Value Ref Range Status   Specimen Description BLOOD LEFT HAND  Final   Special Requests AEROBIC  BOTTLE ONLY Blood Culture adequate volume  Final   Culture   Final    NO GROWTH 5 DAYS Performed at [facility], [address] [place], [place] [address]    Report Status 05/12/2019 FINAL  Final  Culture, blood (routine x 2)     Status: None   Collection Time: 05/07/19  8:50 AM   Specimen: BLOOD RIGHT HAND  Result Value Ref Range Status   Specimen Description BLOOD RIGHT HAND  Final   Special Requests   Final    AEROBIC BOTTLE ONLY Blood Culture results may not be optimal due to an inadequate volume of blood received in culture bottles   Culture   Final    NO GROWTH 5 DAYS Performed at [facility], [address], [place], [place] [address]    Report Status 05/12/2019 FINAL  Final     Labs: BNP (last 3 results) Recent Labs    05/04/19 0330  BNP 29.6   Basic Metabolic Panel: Recent Labs  Lab 05/10/19 0324 05/11/19 0323 05/12/19 0300 05/13/19 0802 05/15/19 0357  NA 134* 133* 133* 134* 136  K 3.4* 4.3 4.7 4.4 4.0  CL 99 101 105 105 107  CO2 25 23 17* 19* 20*  GLUCOSE 82 82 112* 75 106*  BUN 6*  CREATININE 1.24 1.15 1.29* 1.20 1.03  CALCIUM 8.4* 8.0* 8.5* 7.8* 8.1*  MG  --   --   --  2.2 2.3  PHOS  --   --   --  1.8* 2.8   Liver Function Tests: Recent Labs  Lab 05/10/19 0324 05/11/19 0323 05/12/19 0300 05/13/19 0802 05/15/19 0357  AST 90* 116* 106* 69* 53*  ALT 120* 159* 185* 131* 96*  ALKPHOS 67 80 132* 109 132*  BILITOT 1.8* 1.9* 2.4* 1.7* 1.3*  PROT 5.2* 5.1* 6.0* 5.2* 5.5*  ALBUMIN 2.5* 2.5* 3.0* 2.6* 2.6*   Recent Labs  Lab 05/12/19 0300  LIPASE 68*   No results for input(s): AMMONIA in the last 168 hours. CBC: Recent Labs  Lab 05/10/19 0324 05/11/19 0323 05/12/19 0300 05/13/19 0802 05/15/19 0357  WBC 8.3 7.4 10.7* 6.4 7.9  NEUTROABS 5.2 4.9 9.0* 4.9 6.2  HGB 13.7 13.3 15.4 13.0 14.1  HCT 39.0 37.8* 44.4 37.3* 40.9  MCV 100.3* 100.8* 100.5* 101.1* 101.7*  PLT 153 167 209 156 195   Cardiac Enzymes: No  results for input(s): CKTOTAL, CKMB, CKMBINDEX, TROPONINI in the last 168 hours. BNP: Invalid input(s): POCBNP CBG: No results for input(s): GLUCAP in the last 168 hours. D-Dimer No results for input(s): DDIMER in the last 72 hours. Hgb A1c No results for input(s): HGBA1C in the last 72 hours. Lipid Profile No results for input(s): CHOL, HDL, LDLCALC, TRIG, CHOLHDL, LDLDIRECT in the last 72 hours. Thyroid function studies No results for input(s): TSH, T4TOTAL, T3FREE, THYROIDAB in the last 72 hours.  Invalid input(s): FREET3 Anemia work up No results for input(s): VITAMINB12, FOLATE, FERRITIN, TIBC, IRON, RETICCTPCT in the last 72 hours. Urinalysis    Component Value Date/Time   COLORURINE AMBER (A) 05/01/2014 1216   APPEARANCEUR CLEAR 05/01/2014 1216   LABSPEC 1.029 05/01/2014 1216   PHURINE 6.0 05/01/2014 1216   GLUCOSEU NEGATIVE 05/01/2014 1216   HGBUR NEGATIVE 05/01/2014 1216   BILIRUBINUR SMALL (A) 05/01/2014 1216   KETONESUR 15 (A) 05/01/2014 1216   PROTEINUR 30 (A) 05/01/2014 1216   UROBILINOGEN 1.0 05/01/2014 1216   NITRITE NEGATIVE 05/01/2014 1216   LEUKOCYTESUR TRACE (A) 05/01/2014 1216   Sepsis Labs Invalid input(s): PROCALCITONIN,  WBC,  LACTICIDVEN Microbiology Recent Results (from the past 240 hour(s))  Culture, blood (routine x 2)     Status: None   Collection Time: 05/07/19  8:46 AM   Specimen: BLOOD LEFT HAND  Result Value Ref Range Status   Specimen Description BLOOD LEFT HAND  Final   Special Requests AEROBIC BOTTLE ONLY Blood Culture adequate volume  Final   Culture   Final    NO GROWTH 5 DAYS Performed at [facility], [address] [place], [place] [address]    Report Status 05/12/2019 FINAL  Final  Culture, blood (routine x 2)     Status: None   Collection Time: 05/07/19  8:50 AM   Specimen: BLOOD RIGHT HAND  Result Value Ref Range Status   Specimen Description BLOOD RIGHT HAND  Final   Special Requests   Final    AEROBIC BOTTLE  ONLY Blood Culture results may not be optimal due to an inadequate volume of blood received in culture bottles   Culture   Final    NO GROWTH 5 DAYS Performed at [facility], [address],  [place], [place] [id number]    Report Status 05/12/2019 FINAL  Final     Time coordinating discharge: Over 30 minutes  SIGNED:   Cipriano BunkerPARDEEP Zandrea Kenealy, MD  [facility] 05/15/2019, 1:01 PM Pager   If 7PM-7AM, please contact night-coverage [email or web address]

## 2019-05-15 NOTE — Progress Notes (Signed)
10 Days Post-Op   Subjective/Chief Complaint: PT DOING WELL WANTS TO GO HOME    Objective: Vital signs in last 24 hours: Temp:  [98.1 F (36.7 C)-98.5 F (36.9 C)] 98.1 F (36.7 C) (11/29 0412) Pulse Rate:  [57-72] 72 (11/29 0412) Resp:  [16] 16 (11/28 1329) BP: (102-118)/(71-81) 110/81 (11/29 0412) SpO2:  [95 %-97 %] 96 % (11/29 0412) Last BM Date: 05/13/19  Intake/Output from previous day: 11/28 0701 - 11/29 0700 In: 1886.6 [P.O.:300; I.V.:1586.6] Out: 15 [Drains:15] Intake/Output this shift: Total I/O In: 180 [P.O.:180] Out: -   ABDOMEN SOFT NT PORT SITES CLEAN  JP MINIMAL NONBILIOUS   Lab Results:  Recent Labs    05/13/19 0802 05/15/19 0357  WBC 6.4 7.9  HGB 13.0 14.1  HCT 37.3* 40.9  PLT 156 195   BMET Recent Labs    05/13/19 0802 05/15/19 0357  NA 134* 136  K 4.4 4.0  CL 105 107  CO2 19* 20*  GLUCOSE 75 106*  BUN 13 6*  CREATININE 1.20 1.03  CALCIUM 7.8* 8.1*   PT/INR No results for input(s): LABPROT, INR in the last 72 hours. ABG No results for input(s): PHART, HCO3 in the last 72 hours.  Invalid input(s): PCO2, PO2  Studies/Results: No results found.  Anti-infectives: Anti-infectives (From admission, onward)   Start     Dose/Rate Route Frequency Ordered Stop   05/09/19 1600  Ampicillin-Sulbactam (UNASYN) 3 g in sodium chloride 0.9 % 100 mL IVPB  Status:  Discontinued     3 g 200 mL/hr over 30 Minutes Intravenous Every 6 hours 05/09/19 1237 05/10/19 1142   05/04/19 1530  piperacillin-tazobactam (ZOSYN) IVPB 3.375 g  Status:  Discontinued     3.375 g 12.5 mL/hr over 240 Minutes Intravenous Every 8 hours 05/04/19 1523 05/09/19 1237   05/04/19 0600  piperacillin-tazobactam (ZOSYN) IVPB 3.375 g  Status:  Discontinued     3.375 g 100 mL/hr over 30 Minutes Intravenous Every 8 hours 05/04/19 0228 05/04/19 1522   05/03/19 2200  piperacillin-tazobactam (ZOSYN) IVPB 3.375 g     3.375 g 100 mL/hr over 30 Minutes Intravenous  Once 05/03/19 2152  05/04/19 0011      Assessment/Plan: s/p Procedure(s): LAPAROSCOPIC CHOLECYSTECTOMY (N/A) OK TO DISCHARGE HOME AND ADV DIET  REMOVE JP PRIOR TO DISCHARGE   LOS: 12 days    Marcello Moores A Shantrice Rodenberg 05/15/2019

## 2019-05-18 ENCOUNTER — Telehealth: Payer: Self-pay | Admitting: Cardiology

## 2019-05-18 NOTE — Telephone Encounter (Signed)
Patient had cards consult while in hospital. He was advised to schedule hospital f/u - Angie PA 12/17. Advised he should keep this visit (is due for 6 month visit anyway) and it will be determined if he needs Feb 2021 visit with MD as well at this time

## 2019-05-18 NOTE — Telephone Encounter (Signed)
Patient was recently discharged from the hospital. He was admitted for his gallbladder. Part of his discharge instructions were to follow up with all of his providers. He is scheduled to see Angie Duke 12/17 at 10:00 am. He feels fine from a Cardiac standpoint, but he just wants to know if the appointment is needed. Please advise

## 2019-05-20 ENCOUNTER — Telehealth: Payer: Self-pay

## 2019-05-20 NOTE — Telephone Encounter (Signed)
Follow up made for 07-01-2019 at 9am

## 2019-05-20 NOTE — Telephone Encounter (Signed)
-----   Message from [place], MD sent at 05/19/2019  4:56 PM EST ----- Vivien Rota,    I saw this patient in hospital consult when he had acute gallbladder problem, but also diagnosed with cirrhosis based on CT scan findings.  Please arrange a clinic follow up with me in January.  - HD ----- Message ----- From: Doran Stabler, MD Sent: 05/04/2019   6:50 PM EST To: [facility] follow up for cirrhosis  - had cholecystectomy

## 2019-05-30 LAB — MISC LABCORP TEST (SEND OUT): Labcorp test code: 183285

## 2019-06-01 ENCOUNTER — Telehealth: Payer: Self-pay

## 2019-06-01 NOTE — Progress Notes (Signed)
Cardiology Clinic Note   Patient Name: Randy Beasley Date of Encounter: 06/02/2019  Primary Care Provider:  Pearson Forster, MD Primary Cardiologist:  Peter Swaziland, MD  Patient Profile    Randy Beasley 63 year old male presents for follow-up of his coronary artery disease, hyperlipidemia, and chronic systolic CHF.  Past Medical History    Past Medical History:  Diagnosis Date  . Abnormal LFTs   . Anxiety   . CAD (coronary artery disease)    residual disease to LCX and RCA  . Chronic systolic CHF (congestive heart failure) (HCC) 04/05/2015  . Hypothyroidism   . Ischemic cardiomyopathy    EF 25-30%  . Osteoarthritis   . Seizures (HCC)    last was > 20 years ago  . Spinal stenosis   . STEMI (ST elevation myocardial infarction) (HCC) 04/28/14   with stent to LAD   Past Surgical History:  Procedure Laterality Date  . APPENDECTOMY    . BACK SURGERY    . CHOLECYSTECTOMY N/A 05/05/2019   Procedure: LAPAROSCOPIC CHOLECYSTECTOMY;  Surgeon: Abigail Miyamoto, MD;  Location: [facility] OR;  Service: General;  Laterality: N/A;  . CORONARY ANGIOPLASTY WITH STENT PLACEMENT  04/28/14   Promus DES to LAD  . LEFT HEART CATH N/A 04/28/2014   Procedure: LEFT HEART CATH;  Surgeon: Peter M Swaziland, MD;  Location: [facility] CATH LAB;  Service: Cardiovascular;  Laterality: N/A;  . NECK SURGERY    . TONSILLECTOMY      Allergies  No Known Allergies  History of Present Illness  He has past medical history of STEMI in November 2015 with DES to his LAD, residual disease in his left circumflex 60-70% and RCA 50% EF 35% on 07/2014 (refused ICD).  Treated medically.  His echocardiogram on 05/04/2019 showed an LVEF of 40 to 45%, moderate LVH, grade 1 diastolic dysfunction, and mild aortic valve sclerosis without stenosis.  He developed transaminitis on even low-dose Crestor/Lipitor.  He was unable to afford Zetia.  He has also been referred to research however, his LFTs were still too high to qualify for  clinical trial.  He was last seen by Dr. Swaziland on 11/2018.  During that time he was doing well.  He did however experience several episodes of indigestion after eating old pizza at night.  He denied chest pain, palpitations, and dyspnea at that time.  His physical activity was limited due to back pain.  On 05/03/2019 he presented to the emergency department with acute cholecystitis and underwent laparoscopic cholecystectomy on 05/05/2019.  He presents the clinic today and states after his laparoscopic cholecystectomy that he had several days of nausea and vomiting.  His diet has since returned to solid foods.  His activity level has also increased and he has started to walk occasionally.  However, he is limited due to his back pain.  Overall, states he feels much better now that his gallbladder is out his diet has returned to normal.  He denies chest pain, shortness of breath, lower extremity edema, fatigue, palpitations, melena, hematuria, hemoptysis, diaphoresis, weakness, presyncope, syncope, orthopnea, and PND.   Home Medications    Prior to Admission medications   Medication Sig Start Date End Date Taking? Authorizing Provider  aspirin 81 MG tablet Take 81 mg by mouth daily.    [provider]  busPIRone (BUSPAR) 10 MG tablet Take 10 mg by mouth 3 (three) times daily as needed. Anxiety 04/04/14   [provider]  folic acid (FOLVITE) 1 MG tablet Take  1 tablet (1 mg total) by mouth daily. 05/16/19   Cipriano Bunker, MD  furosemide (LASIX) 20 MG tablet TAKE 1 TABLET BY MOUTH EVERY DAY Patient taking differently: Take 20 mg by mouth daily.  11/26/18   Swaziland, Peter M, MD  gabapentin (NEURONTIN) 300 MG capsule Take 1 capsule (300 mg total) by mouth 3 (three) times daily. 05/15/19   Cipriano Bunker, MD  levothyroxine (SYNTHROID) 125 MCG tablet Take 125 mcg by mouth daily. 08/30/18   [provider]  methocarbamol (ROBAXIN) 500 MG tablet Take 1 tablet (500 mg total) by  mouth 3 (three) times daily. 05/15/19   Cipriano Bunker, MD  metoprolol tartrate (LOPRESSOR) 25 MG tablet TAKE ONE-HALF TABLET BY MOUTH TWICE DAILY Patient taking differently: Take 12.5 mg by mouth 2 (two) times daily.  03/08/18   Swaziland, Peter M, MD  nitroGLYCERIN (NITROSTAT) 0.4 MG SL tablet Place 1 tablet (0.4 mg total) under the tongue every 5 (five) minutes x 3 doses as needed for chest pain. 11/22/18   Swaziland, Peter M, MD  oxyCODONE (OXY IR/ROXICODONE) 5 MG immediate release tablet Take 1-2 tablets (5-10 mg total) by mouth every 4 (four) hours as needed for moderate pain or severe pain. 05/15/19   Cornett, Maisie Fus, MD  pantoprazole (PROTONIX) 40 MG tablet Take 1 tablet (40 mg total) by mouth daily. 05/16/19   Cipriano Bunker, MD  ramipril (ALTACE) 5 MG capsule TAKE 1 CAPSULE BY MOUTH EVERY DAY Patient taking differently: Take 5 mg by mouth daily.  04/11/19   Swaziland, Peter M, MD  thiamine 100 MG tablet Take 1 tablet (100 mg total) by mouth daily. 05/16/19   Cipriano Bunker, MD  Vitamin D, Cholecalciferol, 25 MCG (1000 UT) TABS Take 1 tablet by mouth daily.    [provider]    Family History    Family History  Problem Relation Age of Onset  . Hypertension Mother   . Peripheral vascular disease Mother   . Stroke Father   . Heart disease Brother    He indicated that his mother is deceased. He indicated that his father is deceased. He indicated that his brother is alive. He indicated that his maternal grandmother is deceased. He indicated that his maternal grandfather is deceased. He indicated that his paternal grandmother is deceased. He indicated that his paternal grandfather is deceased.  Social History    Social History   Socioeconomic History  . Marital status: Single    Spouse name: Not on file  . Number of children: Not on file  . Years of education: Not on file  . Highest education level: Not on file  Occupational History  . Not on file  Tobacco Use  . Smoking status:  Former Smoker    Packs/day: 0.00    Years: 42.00    Pack years: 0.00    Types: Cigarettes    Quit date: 07/29/2013    Years since quitting: 5.8  . Smokeless tobacco: Former Neurosurgeon    Quit date: 02/14/1982  Substance and Sexual Activity  . Alcohol use: Yes    Alcohol/week: 3.0 - 5.0 standard drinks    Types: 3 - 5 Cans of beer per week    Comment: previously a case of beer a week, 3-4 beers a day, 5 years.  Has not drank in several weeks.  . Drug use: No  . Sexual activity: Yes  Other Topics Concern  . Not on file  Social History Narrative   Lives in [place].  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 Difficulty of Paying Living Expenses: Not on file  Food Insecurity:   . Worried About Programme researcher, broadcasting/film/videounning Out of Food in the Last Year: Not on file  . Ran Out of Food in the Last Year: Not on file  Transportation Needs:   . Lack of Transportation (Medical): Not on file  . Lack of Transportation (Non-Medical): Not on file  Physical Activity:   . Days of Exercise per Week: Not on file  . Minutes of Exercise per Session: Not on file  Stress:   . Feeling of Stress : Not on file  Social Connections:   . Frequency of Communication with Friends and Family: Not on file  . Frequency of Social Gatherings with Friends and Family: Not on file  . Attends Religious Services: Not on file  . Active Member of Clubs or Organizations: Not on file  . Attends BankerClub or Organization Meetings: Not on file  . Marital Status: Not on file  Intimate Partner Violence:   . Fear of Current or Ex-Partner: Not on file  . Emotionally Abused: Not on file  . Physically Abused: Not on file  . Sexually Abused: Not on file     Review of Systems    General:  No chills, fever, night sweats or weight changes.  Cardiovascular:  No chest pain, dyspnea on exertion, edema, orthopnea, palpitations, paroxysmal nocturnal  dyspnea. Dermatological: No rash, lesions/masses Respiratory: No cough, dyspnea Urologic: No hematuria, dysuria Abdominal:   No nausea, vomiting, diarrhea, bright red blood per rectum, melena, or hematemesis Neurologic:  No visual changes, wkns, changes in mental status. All other systems reviewed and are otherwise negative except as noted above.  Physical Exam    VS:  BP 100/72   Pulse 64   Temp (!) 96.8 F (36 C)   Ht 5\' 10"  (1.778 m)   Wt 223 lb 6.4 oz (101.3 kg)   BMI 32.05 kg/m  , BMI Body mass index is 32.05 kg/m. GEN: Well nourished, well developed, in no acute distress. HEENT: normal. Neck: Supple, no JVD, carotid bruits, or masses. Cardiac: RRR, no murmurs, rubs, or gallops. No clubbing, cyanosis, edema.  Radials/DP/PT 2+ and equal bilaterally.  Respiratory:  Respirations regular and unlabored, clear to auscultation bilaterally. GI: Soft, nontender, nondistended, BS + x 4. MS: no deformity or atrophy. Skin: warm and dry, no rash. Neuro:  Strength and sensation are intact. Psych: Normal affect.  Accessory Clinical Findings    ECG personally reviewed by me today-sinus rhythm with occasional premature ventricular complexes and fusion complexes left axis deviation 64 bpm- No acute changes  EKG 05/04/2019 Sinus rhythm 69 bpm  Echocardiogram 05/04/2019 IMPRESSIONS    1. Left ventricular ejection fraction, by visual estimation, is 40 to 45%. The left ventricle has mild to moderately decreased function. There is moderately increased left ventricular hypertrophy. Very difficult study for wall motion even with Definity  use. The mid to apical anteroseptal wall and the true apex appeared akinetic.  2. Definity contrast agent was given IV to delineate the left ventricular endocardial borders.  3. Left ventricular diastolic parameters are consistent with Grade I diastolic dysfunction (impaired relaxation).  4. Global right ventricle has normal systolic function.The right  ventricular size is normal. No increase in right ventricular wall thickness.  5. Left atrial size was normal.  6. Right atrial size was not well  visualized.  7. The mitral valve is normal in structure. No evidence of mitral valve regurgitation. No evidence of mitral stenosis.  8. The tricuspid valve is not well visualized. Tricuspid valve regurgitation is not demonstrated.  9. The aortic valve is tricuspid. Aortic valve regurgitation is not visualized. Mild aortic valve sclerosis without stenosis. 10. TR signal is inadequate for assessing pulmonary artery systolic pressure. 11. Technically difficult study with very poor acoustic windows.  Assessment & Plan   1.  Chronic systolic congestive heart failure-euvolemic today.  Weight today 223.4 pounds down from on 241.1 on 05/05/2019. Continue furosemide 20 mg daily Continue metoprolol tartrate 12.5 mg twice daily Continue ramipril 5 mg tablet daily  Coronary artery disease-no chest pain today.  Status post DES of LAD in November 2015, 70% stenosis circumflex and 50% stenosis RCA, medical management Continue aspirin 81 mg tablet daily Continue ramipril 5 mg tablet daily Continue metoprolol titrate 12.5 mg twice daily Continue nitroglycerin 0.4 mg sublingual tablet as needed Heart healthy low-sodium diet Increase physical activity as tolerated  Hyperlipidemia-05/05/2019: Cholesterol 183; HDL 40; LDL Cholesterol 123; Triglycerides 98; VLDL 20 statin intolerant due to elevated LFTs.  He was not a candidate for research trial due to elevated transaminases.  Unable to afford Zetia or PCSK9 inhibitor. Continue low-sodium heart healthy high-fiber diet Increase physical activity as tolerated  Underwent laparoscopic cholecystectomy on 05/05/2019.  Had severe nausea and vomiting postoperatively.  No nausea vomiting currently and his diet has returned to solid foods.  Disposition: Follow-up with Dr. Martinique in 6 months.  Jossie Ng. [facility] [address] Office [phone] Fax [phone]

## 2019-06-01 NOTE — Telephone Encounter (Signed)
Spoke to pt and made aware of appointment change to see Coletta Memos, NP tomorrow, 12/17 at 9:30 AM. Pt verbalized understanding and agreed with change.

## 2019-06-02 ENCOUNTER — Other Ambulatory Visit: Payer: Self-pay | Admitting: Cardiology

## 2019-06-02 ENCOUNTER — Other Ambulatory Visit: Payer: Self-pay

## 2019-06-02 ENCOUNTER — Ambulatory Visit: Payer: Medicare HMO | Admitting: Physician Assistant

## 2019-06-02 ENCOUNTER — Encounter: Payer: Self-pay | Admitting: General Practice

## 2019-06-02 ENCOUNTER — Ambulatory Visit: Payer: Medicare HMO | Admitting: General Practice

## 2019-06-02 VITALS — BP 100/72 | HR 64 | Temp 96.8°F | Ht 70.0 in | Wt 223.4 lb

## 2019-06-02 DIAGNOSIS — I5022 Chronic systolic (congestive) heart failure: Secondary | ICD-10-CM | POA: Diagnosis not present

## 2019-06-02 DIAGNOSIS — I2510 Atherosclerotic heart disease of native coronary artery without angina pectoris: Secondary | ICD-10-CM

## 2019-06-02 DIAGNOSIS — E7800 Pure hypercholesterolemia, unspecified: Secondary | ICD-10-CM | POA: Diagnosis not present

## 2019-06-02 NOTE — Patient Instructions (Signed)
Medication Instructions:  The current medical regimen is effective;  continue present plan and medications as directed. Please refer to the Current Medication list given to you today. If you need a refill on your cardiac medications before your next appointment, please call your pharmacy.  Follow-Up: IN 6 months Please call our office 2 months in advance, MAY 2021 to schedule this June 2021 appointment. Either In Person or Virtual You may see Peter Martinique, MD Coletta Memos, FNP or one of the following Advanced Practice Providers on your designated Care Team:  Almyra Deforest, PA-C Fabian Sharp, PA-C or [place], [place].    Reduce your risk of getting COVID-19 With your heart disease it is especially important for people at increased risk of severe illness from COVID-19, and those who live with them, to protect themselves from getting COVID-19. The best way to protect yourself and to help reduce the spread of the virus that causes COVID-19 is to: Marland Kitchen Limit your interactions with other people as much as possible. . Take precautions to prevent getting COVID-19 when you do interact with others. If you start feeling sick and think you may have COVID-19, get in touch with your healthcare provider within 24  At [facility], you and your health needs are our priority.  As part of our continuing mission to provide you with exceptional heart care, we have created designated Provider Care Teams.  These Care Teams include your primary Cardiologist (physician) and Advanced Practice Providers (APPs -  Physician Assistants and Nurse Practitioners) who all work together to provide you with the care you need, when you need it.  Thank you for choosing [facility] at [facility]!!        Happy Holidays!!

## 2019-06-02 NOTE — Telephone Encounter (Signed)
Rx has been sent to the pharmacy electronically. ° °

## 2019-06-06 LAB — CULTURE, BLOOD (ROUTINE X 2): Special Requests: ADEQUATE

## 2019-07-01 ENCOUNTER — Ambulatory Visit: Payer: Medicare HMO | Admitting: Gastroenterology

## 2019-07-01 ENCOUNTER — Encounter: Payer: Self-pay | Admitting: Gastroenterology

## 2019-07-01 ENCOUNTER — Other Ambulatory Visit (INDEPENDENT_AMBULATORY_CARE_PROVIDER_SITE_OTHER): Payer: Medicare HMO

## 2019-07-01 VITALS — BP 124/77 | HR 74 | Temp 98.0°F | Ht 70.0 in | Wt 224.0 lb

## 2019-07-01 DIAGNOSIS — I255 Ischemic cardiomyopathy: Secondary | ICD-10-CM | POA: Diagnosis not present

## 2019-07-01 DIAGNOSIS — K7030 Alcoholic cirrhosis of liver without ascites: Secondary | ICD-10-CM

## 2019-07-01 DIAGNOSIS — I2510 Atherosclerotic heart disease of native coronary artery without angina pectoris: Secondary | ICD-10-CM

## 2019-07-01 LAB — HEPATIC FUNCTION PANEL
ALT: 59 U/L — ABNORMAL HIGH (ref 0–53)
AST: 48 U/L — ABNORMAL HIGH (ref 0–37)
Albumin: 4.7 g/dL (ref 3.5–5.2)
Alkaline Phosphatase: 135 U/L — ABNORMAL HIGH (ref 39–117)
Bilirubin, Direct: 0.2 mg/dL (ref 0.0–0.3)
Total Bilirubin: 1 mg/dL (ref 0.2–1.2)
Total Protein: 7.8 g/dL (ref 6.0–8.3)

## 2019-07-01 NOTE — Progress Notes (Signed)
[facility] Consult Note:  History: ACESON LABELL 07/01/2019  Referring provider: Pearson Forster, MD  Reason for consult/chief complaint: Follow-up (hosptial -up with new dx of cirrhosis-" I feel fine" )   Subjective  HPI: Patient was seen by me on 05/04/2019 for inpatient consultation in the setting of acute cholecystitis and discovery of cirrhotic changes on CT scan.  Heavy alcohol use.  LFTs have been elevated in the outpatient setting as far back as 2017, reportedly felt likely to be from his statin medication.  Halo had a protracted course from his cholecystitis, with nearly 2 weeks in the hospital.  However, he feels well at this point and says all his abdominal pain has finally resolved his appetite is returning.  He is trying to make healthier choices, has been able to lose some weight with a better diet, and has had no alcohol since the hospital stay. No GI bleeding, nausea vomiting, dysphagia or odynophagia.  ROS:  Review of Systems  Constitutional: Negative for appetite change and unexpected weight change.  HENT: Negative for mouth sores and voice change.   Eyes: Negative for pain and redness.  Respiratory: Negative for cough and shortness of breath.   Cardiovascular: Negative for chest pain and palpitations.  Genitourinary: Negative for dysuria and hematuria.  Musculoskeletal: Negative for arthralgias and myalgias.  Skin: Negative for pallor and rash.  Neurological: Negative for weakness and headaches.  Hematological: Negative for adenopathy.     Past Medical History: Past Medical History:  Diagnosis Date  . Abnormal LFTs   . Anxiety   . CAD (coronary artery disease)    residual disease to LCX and RCA  . Chronic systolic CHF (congestive heart failure) (HCC) 04/05/2015  . Hypothyroidism   . Ischemic cardiomyopathy    EF 25-30%  . Osteoarthritis   . Seizures (HCC)    last was > 20 years ago  . Spinal stenosis   . STEMI (ST  elevation myocardial infarction) (HCC) 04/28/14   with stent to LAD   From 06/02/2019 cardiology office note:  He has past medical history of STEMI in November 2015 with DES to his LAD, residual disease in his left circumflex 60-70% and RCA 50% EF 35% on 07/2014 (refused ICD).  Treated medically.  His echocardiogram on 05/04/2019 showed an LVEF of 40 to 45%, moderate LVH, grade 1 diastolic dysfunction, and mild aortic valve sclerosis without stenosis.   He developed transaminitis on even low-dose Crestor/Lipitor.  He was unable to afford Zetia.  He has also been referred to research however, his LFTs were still too high to qualify for clinical trial.   He was last seen by Dr. Swaziland on 11/2018.  During that time he was doing well.  He did however experience several episodes of indigestion after eating old pizza at night.  He denied chest pain, palpitations, and dyspnea at that time.  His physical activity was limited due to back pain.   On 05/03/2019 he presented to the emergency department with acute cholecystitis and underwent laparoscopic cholecystectomy on 05/05/2019.   He presents the clinic today and states after his laparoscopic cholecystectomy that he had several days of nausea and vomiting.  His diet has since returned to solid foods.  His activity level has also increased and he has started to walk occasionally.  However, he is limited due to his back pain.  Overall, states he feels much better now that his gallbladder is out his diet has returned to normal.  He denies chest pain, shortness of breath, lower extremity edema, fatigue, palpitations, melena, hematuria, hemoptysis, diaphoresis, weakness, presyncope, syncope, orthopnea, and PND.   Past Surgical History: Past Surgical History:  Procedure Laterality Date  . APPENDECTOMY    . BACK SURGERY    . CHOLECYSTECTOMY N/A 05/05/2019   Procedure: LAPAROSCOPIC CHOLECYSTECTOMY;  Surgeon: Abigail MiyamotoBlackman, Douglas, MD;  [facility];  Service:  General;  Laterality: N/A;  . CORONARY ANGIOPLASTY WITH STENT PLACEMENT  04/28/14   Promus DES to LAD  . LEFT HEART CATH N/A 04/28/2014   Procedure: LEFT HEART CATH;  Surgeon: Peter M SwazilandJordan, MD;  [facility];  Service: Cardiovascular;  Laterality: N/A;  . NECK SURGERY    . TONSILLECTOMY       Family History: Family History  Problem Relation Age of Onset  . Hypertension Mother   . Peripheral vascular disease Mother   . Stroke Father   . Heart disease Father   . Heart disease Brother     Social History: Social History   Socioeconomic History  . Marital status: Single    Spouse name: Not on file  . Number of children: Not on file  . Years of education: Not on file  . Highest education level: Not on file  Occupational History  . Occupation: disable  Tobacco Use  . Smoking status: Former Smoker    Packs/day: 0.00    Years: 42.00    Pack years: 0.00    Types: Cigarettes    Quit date: 07/29/2013    Years since quitting: 5.9  . Smokeless tobacco: Former NeurosurgeonUser    Quit date: 02/14/1982  Substance and Sexual Activity  . Alcohol use: Yes    Alcohol/week: 3.0 - 5.0 standard drinks    Types: 3 - 5 Cans of beer per week    Comment: previously a case of beer a week, 3-4 beers a day, 5 years.  Has not drank in several weeks.  . Drug use: No  . Sexual activity: Yes  Other Topics Concern  . Not on file  Social History Narrative   [place]  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 Difficulty of Paying Living Expenses: Not on file  Food Insecurity:   . Worried About Programme researcher, broadcasting/film/videounning Out of Food in the Last Year: Not on file  . Ran Out of Food in the Last Year: Not on file  Transportation Needs:   . Lack of Transportation (Medical): Not on file  . Lack of Transportation (Non-Medical): Not on file  Physical Activity:   . Days of Exercise per Week: Not on file   . Minutes of Exercise per Session: Not on file  Stress:   . Feeling of Stress : Not on file  Social Connections:   . Frequency of Communication with Friends and Family: Not on file  . Frequency of Social Gatherings with Friends and Family: Not on file  . Attends Religious Services: Not on file  . Active Member of Clubs or Organizations: Not on file  . Attends BankerClub or Organization Meetings: Not on file  . Marital Status: Not on file    Allergies: No Known Allergies  Outpatient Meds: Current Outpatient Medications  Medication Sig Dispense Refill  . aspirin 81 MG tablet Take 81 mg by mouth daily.    . busPIRone (BUSPAR) 10 MG tablet Take 10 mg by mouth  3 (three) times daily as needed. Anxiety  10  . furosemide (LASIX) 20 MG tablet TAKE 1 TABLET BY MOUTH EVERY DAY (Patient taking differently: Take 20 mg by mouth daily. ) 90 tablet 3  . levothyroxine (SYNTHROID) 125 MCG tablet Take 125 mcg by mouth daily.    . metoprolol tartrate (LOPRESSOR) 25 MG tablet Take 0.5 tablets (12.5 mg total) by mouth 2 (two) times daily. 90 tablet 3  . nitroGLYCERIN (NITROSTAT) 0.4 MG SL tablet Place 1 tablet (0.4 mg total) under the tongue every 5 (five) minutes x 3 doses as needed for chest pain. 25 tablet 12  . ramipril (ALTACE) 5 MG capsule TAKE 1 CAPSULE BY MOUTH EVERY DAY (Patient taking differently: Take 5 mg by mouth daily. ) 90 capsule 0  . Vitamin D, Cholecalciferol, 25 MCG (1000 UT) TABS Take 1 tablet by mouth daily.     No current facility-administered medications for this visit.      ___________________________________________________________________ Objective   Exam:  BP 124/77   Pulse 74   Temp 98 F (36.7 C)   Ht 5\' 10"  (1.778 m)   Wt 224 lb (101.6 kg)   SpO2 99%   BMI 32.14 kg/m    General: Well-appearing  Eyes: sclera anicteric, no redness  ENT: oral mucosa moist without lesions, no cervical or supraclavicular lymphadenopathy  CV: RRR without murmur, S1/S2, no JVD, no  peripheral edema  Resp: clear to auscultation bilaterally, normal RR and effort noted  GI: soft, overweight, no tenderness, with active bowel sounds.  Left lobe liver enlarged on inspiration  Skin; warm and dry, no rash or jaundice noted  Neuro: awake, alert and oriented x 3. Normal gross motor function and fluent speech  Labs:  CBC Latest Ref Rng & Units 05/15/2019 05/13/2019 05/12/2019  WBC 4.0 - 10.5 K/uL 7.9 6.4 10.7(H)  Hemoglobin 13.0 - 17.0 g/dL 05/14/2019 85.6 31.4  Hematocrit 39.0 - 52.0 % 40.9 37.3(L) 44.4  Platelets 150 - 400 K/uL 195 156 209   CMP Latest Ref Rng & Units 05/15/2019 05/13/2019 05/12/2019  Glucose 70 - 99 mg/dL 05/14/2019) 75 263(Z)  BUN 8 - 23 mg/dL 6(L) 13 16  Creatinine 0.61 - 1.24 mg/dL 858(I 5.02 7.74)  Sodium 135 - 145 mmol/L 136 134(L) 133(L)  Potassium 3.5 - 5.1 mmol/L 4.0 4.4 4.7  Chloride 98 - 111 mmol/L 107 105 105  CO2 22 - 32 mmol/L 20(L) 19(L) 17(L)  Calcium 8.9 - 10.3 mg/dL 8.1(L) 7.8(L) 8.5(L)  Total Protein 6.5 - 8.1 g/dL 1.28(N) 5.2(L) 6.0(L)  Total Bilirubin 0.3 - 1.2 mg/dL 8.6(V) 6.7(M) 2.4(H)  Alkaline Phos 38 - 126 U/L 132(H) 109 132(H)  AST 15 - 41 U/L 53(H) 69(H) 106(H)  ALT 0 - 44 U/L 96(H) 131(H) 185(H)   INR 1.1 on 05/04/2019   Radiologic Studies:  CLINICAL DATA:  64 year old male with chest pain radiating to the back and abdomen since 1800 hours.   EXAM: CT ANGIOGRAPHY CHEST, ABDOMEN AND PELVIS   TECHNIQUE: Multidetector CT imaging through the chest, abdomen and pelvis was performed using the standard protocol during bolus administration of intravenous contrast. Multiplanar reconstructed images and MIPs were obtained and reviewed to evaluate the vascular anatomy.   CONTRAST:  76mL OMNIPAQUE IOHEXOL 350 MG/ML SOLN   COMPARISON:  Portable chest earlier today.   FINDINGS: CTA CHEST FINDINGS   Cardiovascular: Extensive calcified coronary artery atherosclerosis and/or stent on series 5, image 32. But there is minimal  calcified plaque of the thoracic aorta. Negative  for thoracic aortic aneurysm or dissection.   Cardiac size at the upper limits of normal. No pericardial effusion.   Mediastinum/Nodes: Negative.   Lungs/Pleura: Major airways are patent. Trace apical scarring and paraseptal emphysema. There are 2 small subpleural and possibly sub solid right lung nodules on series 7, images 56 and 61, 3-4 millimeters. Otherwise the lungs are clear. No pleural effusion.   Musculoskeletal: No acute osseous abnormality identified.   Review of the MIP images confirms the above findings.   CTA ABDOMEN AND PELVIS FINDINGS   VASCULAR   Aortoiliac calcified atherosclerosis. Ectatic infrarenal abdominal aorta up to 26 millimeters diameter, but no aortic aneurysm or dissection. Mild ectasia also of the bilateral iliac arteries which are patent. Visible proximal femoral arteries remain patent. The major branches of the abdominal aorta are patent, with only mild atherosclerosis   Review of the MIP images confirms the above findings.   NON-VASCULAR   Hepatobiliary: There is pneumobilia in the liver and in the CBD. And there is also a moderate volume of gas within the lumen of the gallbladder, non dependent on series 6, image 158. No gas identified within the wall of the gallbladder. Irregular stones, sludge, and debris also within the gallbladder lumen. And on coronal image 55 and series 6, image 160 there is evidence of some pericholecystic inflammation. The wall at the fundus appears normal where it is distended by gas.   The liver contour appears nodular. No discrete liver lesion. Questionable hyperenhancement along the gallbladder fossa.   Pancreas: Negative.   Spleen: Diminutive, negative.   Adrenals/Urinary Tract: Normal adrenal glands. Bilateral renal enhancement is symmetric and within normal limits. No hydronephrosis. Decompressed ureters. Unremarkable urinary bladder. No nephrolithiasis  identified.   Stomach/Bowel: Decompressed and negative rectum. Redundant sigmoid colon with extensive diverticulosis in the proximal segment but no active inflammation identified. Lesser diverticulosis throughout the decompressed descending colon with no active inflammation. Occasional diverticula also in the transverse colon. The right colon is on a lax mesentery. No large bowel inflammation identified. The cecum is in the right upper quadrant. The appendix is diminutive or absent.   Negative terminal ileum. No dilated small bowel. Negative stomach. Small duodenal diverticulum on coronal image 83. No inflammation.   No pneumoperitoneum. No free fluid.   Lymphatic: No lymphadenopathy.   Reproductive: Negative.   Other: No pelvic free fluid.   Musculoskeletal: No acute osseous abnormality identified.   Review of the MIP images confirms the above findings.   IMPRESSION: 1. Negative for aortic dissection or aneurysm, but positive for ectatic abdominal aorta at risk for aneurysm development. Recommend followup by Ultrasound in 5 years. This recommendation follows ACR consensus guidelines: White Paper of the ACR Incidental Findings Committee II on Vascular Findings. J Am Coll Radiol 2013; 10:789-794. Aortic aneurysm NOS (ICD10-I71.9).   2. Abnormal gallbladder with stones/sludge, evidence of pericholecystic inflammation, and superimposed pneumobilia. Gas within the lumen of the gallbladder, but not identified within the gallbladder wall. The appearance is suspicious for Acute Cholecystitis, and if the patient is toxic then consider an atypical appearance of Emphysematous Cholecystitis. Recommend Surgery consultation.   3. No other acute or inflammatory process identified in the chest, abdomen, or pelvis.   4. Possible hepatic Cirrhosis, but no stigmata of portal venous hypertension. Extensive calcified coronary artery atherosclerosis and/or stent. Diverticulosis of the  large bowel.   5. Two small 3-4 mm subpleural right lung nodules are likely postinflammatory. No follow-up needed if patient is low-risk (and has no known or  suspected primary neoplasm). Non-contrast chest CT can be considered in 12 months if patient is high-risk. This recommendation follows the consensus statement: Guidelines for Management of Incidental Pulmonary Nodules Detected on CT Images: From the Fleischner Society 2017; Radiology 2017; 284:228-243.   Electronically Signed: By: Genevie Ann M.D. On: 05/03/2019 21:48 ______________________________________  CT scan 05/12/2019 for ongoing postoperative symptoms:  CLINICAL DATA:  Nausea and vomiting. The patient has a history of laparoscopic cholecystectomy on 05/05/2019.   EXAM: CT ABDOMEN AND PELVIS WITH CONTRAST   TECHNIQUE: Multidetector CT imaging of the abdomen and pelvis was performed using the standard protocol following bolus administration of intravenous contrast.   CONTRAST:  125mL OMNIPAQUE IOHEXOL 300 MG/ML  SOLN   COMPARISON:  CT abdomen pelvis dated 04/02/2019   FINDINGS: Lower chest: There is mild right lower lobe atelectasis and minimal left lower lobe atelectasis   Hepatobiliary: The liver has a nodular surface contour, consistent with cirrhosis. No focal liver abnormality is seen. Status post cholecystectomy. There is a gas and fluid collection measuring 6.0 x 3.9 x 5.0 cm surrounding the tip of a surgical drain. An additional gas and fluid collection is seen posterior and medial to this area posterior to the duodenum measuring 3.0 x 1.5 x 2.1 cm. This is not definitely in communication with the collection surrounding the surgical drain. No biliary dilatation or pneumobilia.   Pancreas: Unremarkable. No pancreatic ductal dilatation or surrounding inflammatory changes.   Spleen: Normal in size without focal abnormality.   Adrenals/Urinary Tract: Adrenal glands are unremarkable. Kidneys are normal,  without renal calculi, focal lesion, or hydronephrosis. Bladder is unremarkable.   Stomach/Bowel: Stomach is within normal limits. No pericecal inflammatory changes to suggest acute appendicitis. There is colonic diverticulosis without evidence of diverticulitis. Multiple loops of small bowel are distended and mildly dilated without a discrete transition point to suggest a high-grade small bowel obstruction. Enteric contrast reaches the midportion of the small bowel we are becomes more dilute. No evidence of bowel wall thickening or inflammatory changes.   Vascular/Lymphatic: Aortic atherosclerosis. An enlarged periportal lymph node is likely sequela of the patient's hepatic cirrhosis. No enlarged pelvic lymph nodes.   Reproductive: Prostate is unremarkable.   Other: There is a small amount of ascites. Fat stranding is seen in the paraumbilical subcutaneous fat which may be postoperative. No abdominal wall hernia is identified.   Musculoskeletal: No acute or significant osseous findings.   IMPRESSION: 1. Postoperative appearance of cholecystectomy with a gas and fluid collection measuring 6.0 x 3.9 x 5.0 cm surrounding the tip of a surgical drain in the gallbladder fossa. An additional gas and fluid collection is seen posterior and medial to this area measuring 3.0 x 1.5 x 2.1 cm. This is not definitely in communication with the collection surrounding the surgical drain. 2. Mildly dilated loops of small bowel without a discrete transition point to suggest high-grade bowel obstruction. This is favored to reflect postoperative ileus. Hepatic cirrhosis. 3. Small volume ascites.   Aortic Atherosclerosis (ICD10-I70.0).     Electronically Signed   By: Zerita Boers M.D.   On: 05/12/2019 14:50 _________________________________________  Echocardiogram 05/04/2019 IMPRESSIONS      1. Left ventricular ejection fraction, by visual estimation, is 40 to 45%. The left ventricle has  mild to moderately decreased function. There is moderately increased left ventricular hypertrophy. Very difficult study for wall motion even with Definity  use. The mid to apical anteroseptal wall and the true apex appeared akinetic.  2. Definity contrast agent  was given IV to delineate the left ventricular endocardial borders.  3. Left ventricular diastolic parameters are consistent with Grade I diastolic dysfunction (impaired relaxation).  4. Global right ventricle has normal systolic function.The right ventricular size is normal. No increase in right ventricular wall thickness.  5. Left atrial size was normal.  6. Right atrial size was not well visualized.  7. The mitral valve is normal in structure. No evidence of mitral valve regurgitation. No evidence of mitral stenosis.  8. The tricuspid valve is not well visualized. Tricuspid valve regurgitation is not demonstrated.  9. The aortic valve is tricuspid. Aortic valve regurgitation is not visualized. Mild aortic valve sclerosis without stenosis. 10. TR signal is inadequate for assessing pulmonary artery systolic pressure. 11. Technically difficult study with very poor acoustic windows.   Assessment: Encounter Diagnoses  Name Primary?  . Alcoholic cirrhosis of liver without ascites (HCC) Yes  . Ischemic cardiomyopathy   . Coronary artery disease involving native coronary artery of native heart without angina pectoris     Recent diagnosis of cirrhosis, most likely from a combination of alcohol and suspected underlying fatty liver with metabolic syndrome.  I am glad he has been completely abstinent from alcohol since the hospital stay, and was encouraged to do so indefinitely. We had a long discussion about the nature of cirrhosis, potential complications, and that how he is currently on the compensated end of the spectrum.  No clinical encephalopathy, no ascites, no GI bleeding.  He was also happy to hear that his most recent echocardiogram  showed an improvement in LV ejection fraction from prior studies.  We discussed the need for long-term follow-up with me, need for ongoing hepatocellular carcinoma screening every 6 months, as well as variceal screening and colon cancer screening.  Plan:  Hepatic function panel today (to see where they are now that the inflammation of the cholecystitis has resolved), and a baseline alpha-fetoprotein  I recommended he consider an EGD for variceal screening, as well as a screening colonoscopy.  He has had no prior colon cancer screening. He considered it, and preferred to wait several months until the current Covid surge has hopefully subsided.  That is fine, since neither procedure is urgent.  We will contact him with lab results and he will see me in about 4 months, calling sooner as needed. At that time, he will also need hepatitis a total antibody (only IgM done with the acute hepatitis panel in hospital), and then plan viral hepatitis vaccinations. He was advised not to consume sushi due to the increased risk of a particular vibrio infection in cirrhotic patients.  Total time 40 minutes, over half spent face-to-face with patient in counseling and coordinating care.  Extensive record review required.  Thank you for the courtesy of this consult.  Please call me with any questions or concerns.  Randy Beasley  CC: Referring provider noted above

## 2019-07-01 NOTE — Patient Instructions (Addendum)
If you are age 66 or older, your body mass index should be between 23-30. Your Body mass index is 32.14 kg/m. If this is out of the aforementioned range listed, please consider follow up with your Primary Care Provider.  If you are age 41 or younger, your body mass index should be between 19-25. Your Body mass index is 32.14 kg/m. If this is out of the aformentioned range listed, please consider follow up with your Primary Care Provider.   It has been recommended to you by your physician that you have a(n) colon/EGD completed. Per your request, we did not schedule the procedure(s) today. Please contact our office at [phone] should you decide to have the procedure completed. You will be scheduled for a pre-visit and procedure at that time.  Your provider has requested that you go to the basement level for lab work before leaving today. Press "B" on the elevator. The lab is located at the first door on the left as you exit the elevator.  Due to recent changes in healthcare laws, you may see the results of your imaging and laboratory studies on MyChart before your provider has had a chance to review them.  We understand that in some cases there may be results that are confusing or concerning to you. Not all laboratory results come back in the same time frame and the provider may be waiting for multiple results in order to interpret others.  Please give [place] 48 hours in order for your provider to thoroughly review all the results before contacting the office for clarification of your results.   It was a pleasure to see you today!  Dr. Myrtie Neither

## 2019-07-04 LAB — AFP TUMOR MARKER: AFP-Tumor Marker: 4.7 ng/mL (ref <6.1)

## 2019-07-08 ENCOUNTER — Other Ambulatory Visit: Payer: Self-pay

## 2019-07-08 MED ORDER — RAMIPRIL 5 MG PO CAPS: 5.0000 mg | ORAL_CAPSULE | Freq: Every day | ORAL | 1 refills | Status: DC

## 2019-07-11 ENCOUNTER — Other Ambulatory Visit: Payer: Self-pay

## 2019-07-18 ENCOUNTER — Ambulatory Visit: Payer: Medicare HMO | Admitting: Cardiology

## 2019-11-10 ENCOUNTER — Ambulatory Visit: Payer: Medicare HMO | Admitting: Gastroenterology

## 2019-11-10 ENCOUNTER — Other Ambulatory Visit: Payer: Medicare HMO

## 2019-11-10 ENCOUNTER — Encounter: Payer: Self-pay | Admitting: Gastroenterology

## 2019-11-10 ENCOUNTER — Other Ambulatory Visit: Payer: Self-pay

## 2019-11-10 VITALS — BP 98/62 | HR 58 | Ht 70.5 in | Wt 211.0 lb

## 2019-11-10 DIAGNOSIS — Z1211 Encounter for screening for malignant neoplasm of colon: Secondary | ICD-10-CM

## 2019-11-10 DIAGNOSIS — K7030 Alcoholic cirrhosis of liver without ascites: Secondary | ICD-10-CM | POA: Diagnosis not present

## 2019-11-10 NOTE — Patient Instructions (Signed)
If you are age 67 or older, your body mass index should be between 23-30. Your Body mass index is 29.85 kg/m. If this is out of the aforementioned range listed, please consider follow up with your Primary Care Provider.  If you are age 55 or younger, your body mass index should be between 19-25. Your Body mass index is 29.85 kg/m. If this is out of the aformentioned range listed, please consider follow up with your Primary Care Provider.   It has been recommended to you by your physician that you have a(n) EGD completed. Per your request, we did not schedule the procedure(s) today. Please contact our office at [phone] should you decide to have the procedure completed. You will be scheduled for a pre-visit and procedure at that time.   Your provider has requested that you go to the basement level for lab work before leaving today. Press "B" on the elevator. The lab is located at the first door on the left as you exit the elevator.  Due to recent changes in healthcare laws, you may see the results of your imaging and laboratory studies on MyChart before your provider has had a chance to review them.  We understand that in some cases there may be results that are confusing or concerning to you. Not all laboratory results come back in the same time frame and the provider may be waiting for multiple results in order to interpret others.  Please give [place] 48 hours in order for your provider to thoroughly review all the results before contacting the office for clarification of your results.   You have been scheduled for an abdominal ultrasound at [facility] Radiology (1st floor of hospital) on 11-16-2019 at 830am. Please arrive 15 minutes prior to your appointment for registration. Make certain not to have anything to eat or drink 6 hours prior to your appointment. Should you need to reschedule your appointment, please contact radiology at [phone]. This test typically takes about 30 minutes to  perform.  It was a pleasure to see you today!  Dr. Myrtie Neither

## 2019-11-10 NOTE — Progress Notes (Signed)
GI Progress Note  Chief Complaint: Alcohol-related cirrhosis  Subjective  History: From Jan 2021 office note: "Patient was seen by me on 05/04/2019 for inpatient consultation in the setting of acute cholecystitis and discovery of cirrhotic changes on CT scan.  Heavy alcohol use.  LFTs have been elevated in the outpatient setting as far back as 2017, reportedly felt likely to be from his statin medication.   Ed had a protracted course from his cholecystitis, with nearly 2 weeks in the hospital.  However, he feels well at this point and says all his abdominal pain has finally resolved his appetite is returning.  He is trying to make healthier choices, has been able to lose some weight with a better diet, and has had no alcohol since the hospital stay. No GI bleeding, nausea vomiting, dysphagia or odynophagia."  Plan was to check hepatitis A total antibody at this visit and discuss screening EGD and screening colonoscopy.  He wanted to hold off on those procedures at the last visit due to the Covid surge at that time.  Felipe has been feeling well since I last saw him, and he has been able to lose a significant amount of weight since stopping alcohol, improving his diet, increasing activity and diuresis.  He denies abdominal pain, nausea vomiting black or bloody stool.  ROS: Cardiovascular:  no chest pain Respiratory: no dyspnea Anxiety stable Remainder of systems negative except as above The patient's Past Medical, Family and Social History were reviewed and are on file in the EMR.  Objective:  Med list reviewed  Current Outpatient Medications:  .  aspirin 81 MG tablet, Take 81 mg by mouth daily., Disp: , Rfl:  .  busPIRone (BUSPAR) 10 MG tablet, Take 10 mg by mouth 3 (three) times daily as needed. Anxiety, Disp: , Rfl: 10 .  furosemide (LASIX) 20 MG tablet, TAKE 1 TABLET BY MOUTH EVERY DAY (Patient taking differently: Take 20 mg by mouth daily. ), Disp: 90  tablet, Rfl: 3 .  levothyroxine (SYNTHROID) 125 MCG tablet, Take 125 mcg by mouth daily., Disp: , Rfl:  .  metoprolol tartrate (LOPRESSOR) 25 MG tablet, Take 0.5 tablets (12.5 mg total) by mouth 2 (two) times daily., Disp: 90 tablet, Rfl: 3 .  nitroGLYCERIN (NITROSTAT) 0.4 MG SL tablet, Place 1 tablet (0.4 mg total) under the tongue every 5 (five) minutes x 3 doses as needed for chest pain., Disp: 25 tablet, Rfl: 12 .  ramipril (ALTACE) 5 MG capsule, Take 1 capsule (5 mg total) by mouth daily., Disp: 90 capsule, Rfl: 1 .  Vitamin D, Cholecalciferol, 25 MCG (1000 UT) TABS, Take 1 tablet by mouth daily., Disp: , Rfl:    Vital signs in last 24 hrs: Vitals:   11/10/19 0822  BP: 98/62  Pulse: (!) 58    Physical Exam  He is well-appearing, pleasant and conversational.  His overall condition appears improved from the last time I saw him.  He is ambulatory and gets on the exam table without difficulty.  Vocal quality normal.  HEENT: sclera anicteric, oral mucosa moist without lesions  Neck: supple, no thyromegaly, JVD or lymphadenopathy  Cardiac: RRR without murmurs, S1S2 heard, no peripheral edema  Pulm: clear to auscultation bilaterally, normal RR and effort noted  Abdomen: soft, no tenderness, with active bowel sounds.  Left lobe liver enlarged.  No bulging flanks or distention to suggest ascites.  Skin; warm and dry, no jaundice or rash Neuro: Normal gross motor function, speech  fluent, no asterixis  Labs:  Hepatic Function Latest Ref Rng & Units 07/01/2019 05/15/2019 05/13/2019  Total Protein 6.0 - 8.3 g/dL 7.8 5.5(L) 5.2(L)  Albumin 3.5 - 5.2 g/dL 4.7 2.6(L) 2.6(L)  AST 0 - 37 U/L 48(H) 53(H) 69(H)  ALT 0 - 53 U/L 59(H) 96(H) 131(H)  Alk Phosphatase 39 - 117 U/L 135(H) 132(H) 109  Total Bilirubin 0.2 - 1.2 mg/dL 1.0 1.3(H) 1.7(H)  Bilirubin, Direct 0.0 - 0.3 mg/dL 0.2 - -   Last AFP 07/01/19 = 4.7  ___________________________________________ Radiologic studies: Last  ultrasound and CT 06-30-18 during hospital stay for cholecystitis.  ____________________________________________ Other:   _____________________________________________ Assessment & Plan  Assessment: Encounter Diagnoses  Name Primary?  . Alcoholic cirrhosis of liver without ascites ([place]) Yes  . Special screening for malignant neoplasms, colon     Cirrhosis from prior heavy alcohol use and underlying fatty liver. History of coronary disease and prior MI, after which he became disabled.  We again discussed cirrhosis and its potential complications including development of esophageal varices that could cause bleeding, hepatic encephalopathy, ascites/volume overload and increased risk of hepatocellular carcinoma. He needs an upper endoscopy for variceal screening and screening colonoscopy.  He still feels he may not be ready to do those, mainly because he does not have a care partner for the day of procedure.  I told him about care partner service for hire, and he requested that information so he could contact them and learn more about it.  He did not feel ready to schedule endoscopic procedures at this time.  Plan:  Hepatitis A total antibody.  If not immune, needs Twinrix.  If he is immune, needs just hepatitis B.  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  If so, he appears to have lost his immunity.  He is euvolemic.  Blood pressure a little low but asymptomatic today.  He follows with primary care and cardiology.  He is due for a right upper quadrant ultrasound for hepatoma screening, and that was scheduled today. Further follow-up depending on test results, no longer than 6 months.  Patient should contact us sooner if he feels ready to proceed with his EGD and colonoscopy.  30 minutes were spent on this encounter (including chart review, history/exam, counseling/coordination of care, and documentation)  Nelida Meuse  III

## 2019-11-11 LAB — HEPATITIS A ANTIBODY, TOTAL: Hepatitis A AB,Total: NONREACTIVE

## 2019-11-14 ENCOUNTER — Other Ambulatory Visit: Payer: Self-pay | Admitting: Cardiology

## 2019-11-16 ENCOUNTER — Ambulatory Visit (HOSPITAL_COMMUNITY)
Admission: RE | Admit: 2019-11-16 | Discharge: 2019-11-16 | Disposition: A | Discharge status: Home / Self Care | Payer: Medicare HMO | Source: Ambulatory Visit | Attending: Gastroenterology | Admitting: Gastroenterology

## 2019-11-16 ENCOUNTER — Other Ambulatory Visit: Payer: Self-pay

## 2019-11-16 DIAGNOSIS — Z1211 Encounter for screening for malignant neoplasm of colon: Secondary | ICD-10-CM

## 2019-11-16 DIAGNOSIS — K7030 Alcoholic cirrhosis of liver without ascites: Secondary | ICD-10-CM

## 2019-11-18 ENCOUNTER — Other Ambulatory Visit: Payer: Self-pay

## 2019-11-23 NOTE — Progress Notes (Signed)
Virtual Visit via Video Note   This visit type was conducted due to national recommendations for restrictions regarding the COVID-19 Pandemic (e.g. social distancing) in an effort to limit this patient's exposure and mitigate transmission in our community.  Due to his co-morbid illnesses, this patient is at least at moderate risk for complications without adequate follow up.  This format is felt to be most appropriate for this patient at this time.  All issues noted in this document were discussed and addressed.  A limited physical exam was performed with this format.  Please refer to the patient's chart for his consent to telehealth for [facility]   The patient was identified using 2 identifiers.  Date:  11/24/2019   ID:  Randy Beasley, DOB [birth year], MRN [id number]  Patient Location: Home Provider Location: Home  PCP:  Pearson Forster, MD  Cardiologist:  Leeona Mccardle [place], MD  Electrophysiologist:  None   Evaluation Performed:  Follow-Up Visit  Chief Complaint:  CAD  History of Present Illness:    Randy Beasley is a 64 y.o. male with a history of STEMI 04/2014 w/ DES LAD residual disease in LCx of 60-70% and RCA of 50% treated medically, w/ EF 40%>>25%>>35% 07/2014 (refused ICD), remote ETOH &tobacco, transaminitis 2nd statins.  He has developed transaminitis on even low dose Crestor/lipitor. Hehas beenunable to afford Zetia. On and off again insurance coverage.Was referred to Research previously but LFTs still too high to qualify for clinical trial.  He was admitted with acute cholecystitis in November and underwent cholecystectomy. Echo at that time showed EF had improved from 30-35% to 40-45% with apical akinesis. He states that since then he has felt much better. He has lost 40+ lbs. He is walking 30 minutes a day. He quit drinking beer. Is smoking 3 cigs/day. Feels great overall. No chest pain.  He woke up one morning in April with a blind spot in his right eye.  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 It is improving.   The patient does not have symptoms concerning for COVID-19 infection (fever, chills, cough, or new shortness of breath).    Past Medical History:  Diagnosis Date  . Abnormal LFTs   . Anxiety   . CAD (coronary artery disease)    residual disease to LCX and RCA  . Chronic systolic CHF (congestive heart failure) (HCC) 04/05/2015  . Hypothyroidism   . Ischemic cardiomyopathy    EF 25-30%  . Mini stroke (HCC)    vision loss right eye   . Osteoarthritis   . Seizures (HCC)    last was > 20 years ago  . Spinal stenosis   . STEMI (ST elevation myocardial infarction) (HCC) 04/28/14   with stent to LAD  . Vision loss of right eye    mini stroke    Past Surgical History:  Procedure Laterality Date  . APPENDECTOMY    . BACK SURGERY    . CHOLECYSTECTOMY N/A 05/05/2019   Procedure: LAPAROSCOPIC CHOLECYSTECTOMY;  Surgeon: Abigail Miyamoto, MD;  Location: [facility] OR;  Service: General;  Laterality: N/A;  . CORONARY ANGIOPLASTY WITH STENT PLACEMENT  04/28/14   Promus DES to LAD  . LEFT HEART CATH N/A 04/28/2014   Procedure: LEFT HEART CATH;  Surgeon: Roniesha Hollingshead M [place], MD;  Location: [facility] CATH LAB;  Service: Cardiovascular;  Laterality: N/A;  . NECK SURGERY    . TONSILLECTOMY       Current Meds  Medication Sig  . aspirin 81 MG  tablet Take 81 mg by mouth daily.  . busPIRone (BUSPAR) 10 MG tablet Take 10 mg by mouth 3 (three) times daily as needed. Anxiety  . furosemide (LASIX) 20 MG tablet TAKE 1 TABLET BY MOUTH EVERY DAY  . levothyroxine (SYNTHROID) 125 MCG tablet Take 125 mcg by mouth daily.  . metoprolol tartrate (LOPRESSOR) 25 MG tablet Take 0.5 tablets (12.5 mg total) by mouth 2 (two) times daily.  . nitroGLYCERIN (NITROSTAT) 0.4 MG SL tablet Place 1 tablet (0.4 mg total) under the tongue every 5 (five) minutes x 3 doses as needed for chest pain.  . ramipril (ALTACE) 5 MG capsule TAKE 1 CAPSULE BY MOUTH EVERY DAY  . Vitamin  D, Cholecalciferol, 25 MCG (1000 UT) TABS Take 1 tablet by mouth daily.     Allergies:   Patient has no known allergies.   Social History   Tobacco Use  . Smoking status: Former Smoker    Packs/day: 0.00    Years: 42.00    Pack years: 0.00    Types: Cigarettes    Quit date: 07/29/2013    Years since quitting: 6.3  . Smokeless tobacco: Former Neurosurgeon    Quit date: 02/14/1982  Substance Use Topics  . Alcohol use: Yes    Alcohol/week: 3.0 - 5.0 standard drinks    Types: 3 - 5 Cans of beer per week    Comment: previously a case of beer a week, 3-4 beers a day, 5 years.  Has not drank in several weeks.  . Drug use: No     Family Hx: The patient's family history includes Heart disease in his brother and father; Hypertension in his mother; Peripheral vascular disease in his mother; Stroke in his father.  ROS:   Please see the history of present illness.    All other systems reviewed and are negative.   Prior CV studies:   The following studies were reviewed today:  Echocardiogram 05/04/2019 IMPRESSIONS   1. Left ventricular ejection fraction, by visual estimation, is 40 to 45%. The left ventricle has mild to moderately decreased function. There is moderately increased left ventricular hypertrophy. Very difficult study for wall motion even with Definity  use. The mid to apical anteroseptal wall and the true apex appeared akinetic. 2. Definity contrast agent was given IV to delineate the left ventricular endocardial borders. 3. Left ventricular diastolic parameters are consistent with Grade I diastolic dysfunction (impaired relaxation). 4. Global right ventricle has normal systolic function.The right ventricular size is normal. No increase in right ventricular wall thickness. 5. Left atrial size was normal. 6. Right atrial size was not well visualized. 7. The mitral valve is normal in structure. No evidence of mitral valve regurgitation. No evidence of mitral stenosis. 8.  The tricuspid valve is not well visualized. Tricuspid valve regurgitation is not demonstrated. 9. The aortic valve is tricuspid. Aortic valve regurgitation is not visualized. Mild aortic valve sclerosis without stenosis. 10. TR signal is inadequate for assessing pulmonary artery systolic pressure. 11. Technically difficult study with very poor acoustic windows.  Labs/Other Tests and Data Reviewed:    EKG:  No ECG reviewed.  Recent Labs: 05/04/2019: B Natriuretic Peptide 29.6; TSH 0.918 05/15/2019: BUN 6; Creatinine, Ser 1.03; Hemoglobin 14.1; Magnesium 2.3; Platelets 195; Potassium 4.0; Sodium 136 07/01/2019: ALT 59   Recent Lipid Panel Lab Results  Component Value Date/Time   CHOL 183 05/05/2019 03:25 AM   TRIG 98 05/05/2019 03:25 AM   HDL 40 (L) 05/05/2019 03:25 AM   CHOLHDL  4.6 05/05/2019 03:25 AM   LDLCALC 123 (H) 05/05/2019 03:25 AM    Wt Readings from Last 3 Encounters:  11/24/19 214 lb (97.1 kg)  11/10/19 211 lb (95.7 kg)  07/01/19 224 lb (101.6 kg)     Objective:    Vital Signs:  Ht 5' 10.5" (1.791 m)   Wt 214 lb (97.1 kg)   BMI 30.27 kg/m    VITAL SIGNS:  reviewed  General WD WM in NAD HEENT normal Respirations unlabored.  Skin clear. Neuro alert and oriented x 3. No focal findings.   ASSESSMENT & PLAN:    1. Chronic systolic CHF: Last EF improved to 40-45% in November 20202.  Patient reports weight is down and he has no edema. We will continue Lasix 20 mg daily. Continue metoprolol and ACEi.Low sodium diet.   2. CAD, chest pain: s/p DES of LAD in November 2015. He had a 70% circumflex and a 50% RCA at that time that were treated medically.No significant angina.He is on aspirin, an ACE inhibitor, and a beta blocker. No significant angina.   3. Dyslipidemia. Continue dietary and exercise modification. Not a candidate for statin due to elevated LFTs. ? If this was all related to his gallbladder.  Not a candidate for Research trial due to elevated  transaminases.Unable to afford Zetia or PCSK 9 inhibitor. May be a candidate for one of the newer agents once on the market depending on cost.we will repeat fasting labs and LFTs on next visit  4. Ischemic ocular neuropathy. Will arrange for carotid dopplers.    COVID-19 Education: The signs and symptoms of COVID-19 were discussed with the patient and how to seek care for testing (follow up with PCP or arrange E-visit).  The importance of social distancing was discussed today.  Time:   Today, I have spent 15 minutes with the patient with telehealth technology discussing the above problems.     Medication Adjustments/Labs and Tests Ordered: Current medicines are reviewed at length with the patient today.  Concerns regarding medicines are outlined above.   Tests Ordered: No orders of the defined types were placed in this encounter.   Medication Changes: No orders of the defined types were placed in this encounter.   Follow Up:  In Person in 6 month(s) with lab  Signed, Audrina Marten [place], MD  11/24/2019 8:49 AM    [place]

## 2019-11-24 ENCOUNTER — Encounter: Payer: Self-pay | Admitting: Cardiology

## 2019-11-24 ENCOUNTER — Telehealth (INDEPENDENT_AMBULATORY_CARE_PROVIDER_SITE_OTHER): Payer: Medicare HMO | Admitting: Cardiology

## 2019-11-24 VITALS — Ht 70.5 in | Wt 214.0 lb

## 2019-11-24 DIAGNOSIS — I5022 Chronic systolic (congestive) heart failure: Secondary | ICD-10-CM | POA: Diagnosis not present

## 2019-11-24 DIAGNOSIS — I2510 Atherosclerotic heart disease of native coronary artery without angina pectoris: Secondary | ICD-10-CM | POA: Diagnosis not present

## 2019-11-24 DIAGNOSIS — H3582 Retinal ischemia: Secondary | ICD-10-CM | POA: Diagnosis not present

## 2019-11-24 DIAGNOSIS — E7800 Pure hypercholesterolemia, unspecified: Secondary | ICD-10-CM

## 2019-11-24 NOTE — Addendum Note (Signed)
Addended by: Lajoyce Corners on: 11/24/2019 09:01 AM   Modules accepted: Orders

## 2019-11-24 NOTE — Patient Instructions (Signed)
Medication Instructions:   Your physician recommends that you continue on your current medications as directed. Please refer to the Current Medication list given to you today.  *If you need a refill on your cardiac medications before your next appointment, please call your pharmacy*  Lab Work:  None ordered today  Testing/Procedures:  Your physician has requested that you have a carotid duplex. This test is an ultrasound of the carotid arteries in your neck. It looks at blood flow through these arteries that supply the brain with blood. Allow one hour for this exam. There are no restrictions or special instructions.  Follow-Up: At [facility], you and your health needs are our priority.  As part of our continuing mission to provide you with exceptional heart care, we have created designated Provider Care Teams.  These Care Teams include your primary Cardiologist (physician) and Advanced Practice Providers (APPs -  Physician Assistants and Nurse Practitioners) who all work together to provide you with the care you need, when you need it.  We recommend signing up for the patient portal called "MyChart".  Sign up information is provided on this After Visit Summary.  MyChart is used to connect with patients for Virtual Visits (Telemedicine).  Patients are able to view lab/test results, encounter notes, upcoming appointments, etc.  Non-urgent messages can be sent to your provider as well.   To learn more about what you can do with MyChart, go to [email or web address].    Your next appointment:   6 month(s)  The format for your next appointment:   In Person  Provider:   You may see Peter [place], MD or one of the following Advanced Practice Providers on your designated Care Team:    Azalee Course, PA-C  Micah Flesher, PA-C or   Judy Pimple, [place]

## 2019-12-01 ENCOUNTER — Telehealth: Payer: Self-pay

## 2019-12-01 NOTE — Telephone Encounter (Signed)
Patient called and stated that he received letter to call for results, discussed results with patient. Pt advised to call back for routine 6 month follow up in November as we do not have that schedule available right now. Pt scheduled for 1st TwinRix injection on 12/07/19 at 9 am

## 2019-12-07 ENCOUNTER — Ambulatory Visit (INDEPENDENT_AMBULATORY_CARE_PROVIDER_SITE_OTHER): Payer: Medicare HMO | Admitting: Gastroenterology

## 2019-12-07 DIAGNOSIS — Z23 Encounter for immunization: Secondary | ICD-10-CM | POA: Diagnosis not present

## 2019-12-07 DIAGNOSIS — K7030 Alcoholic cirrhosis of liver without ascites: Secondary | ICD-10-CM

## 2019-12-15 ENCOUNTER — Other Ambulatory Visit: Payer: Self-pay

## 2019-12-15 ENCOUNTER — Ambulatory Visit (HOSPITAL_COMMUNITY)
Admission: RE | Admit: 2019-12-15 | Discharge: 2019-12-15 | Disposition: A | Discharge status: Home / Self Care | Payer: Medicare HMO | Source: Ambulatory Visit | Attending: Cardiology | Admitting: Cardiology

## 2019-12-15 DIAGNOSIS — H3582 Retinal ischemia: Secondary | ICD-10-CM | POA: Insufficient documentation

## 2020-01-09 ENCOUNTER — Ambulatory Visit (INDEPENDENT_AMBULATORY_CARE_PROVIDER_SITE_OTHER): Payer: Medicare HMO | Admitting: Gastroenterology

## 2020-01-09 DIAGNOSIS — Z23 Encounter for immunization: Secondary | ICD-10-CM

## 2020-02-20 IMAGING — CT CT ABD-PELV W/ CM
2 of 5 series · 16 of 46 positions shown, 18 images · IV contrast (Omni 300)
Comparison: CT abdomen pelvis dated 04/02/2019

CLINICAL DATA: Nausea and vomiting. The patient has a history of
laparoscopic cholecystectomy on 05/05/2019.

EXAM:
CT ABDOMEN AND PELVIS WITH CONTRAST
TECHNIQUE: Multidetector CT imaging of the abdomen and pelvis was performed
using the standard protocol following bolus administration of
intravenous contrast.
CONTRAST:  100mL OMNIPAQUE IOHEXOL 300 MG/ML  SOLN

[Series 3: a/p w/ 5mm · axial · 0.98mm/px · z∈[-533,-88]mm · 13 of 101 slices shown, 15 images]
[im 6/101  soft-tissue]
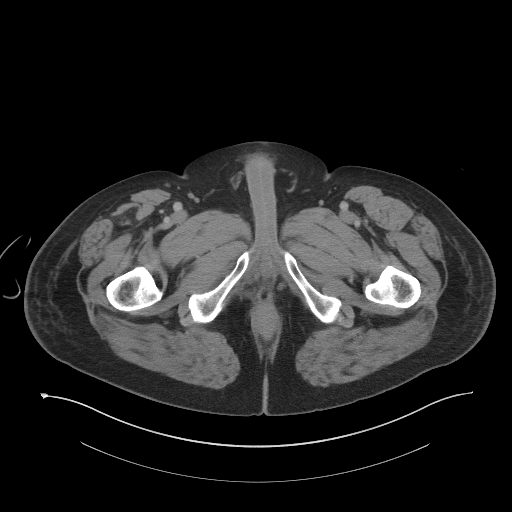
[im 6/101  bone]
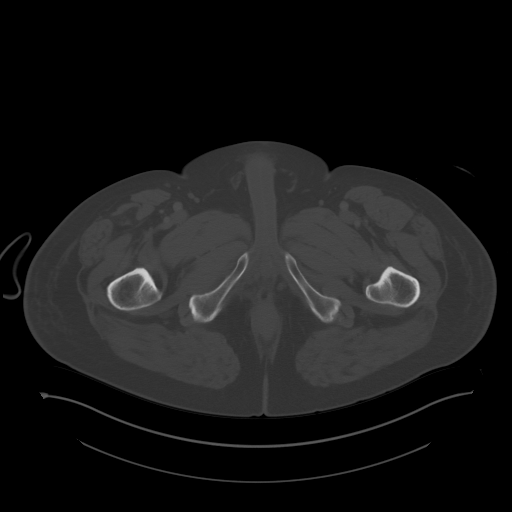
[im 12/101  soft-tissue]
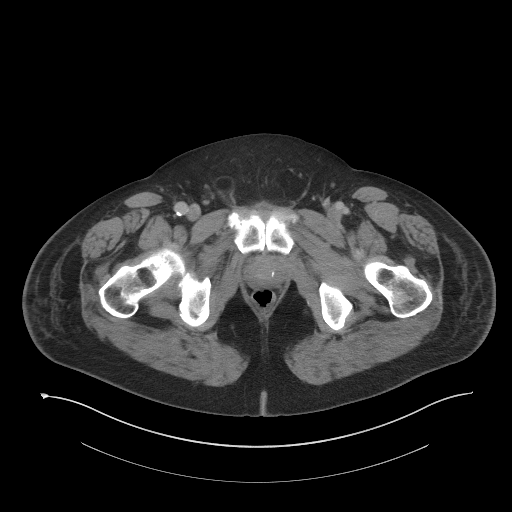
[im 23/101  soft-tissue]
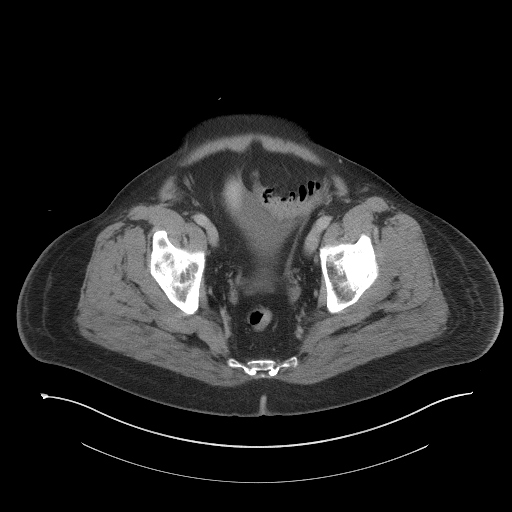
[im 28/101  soft-tissue]
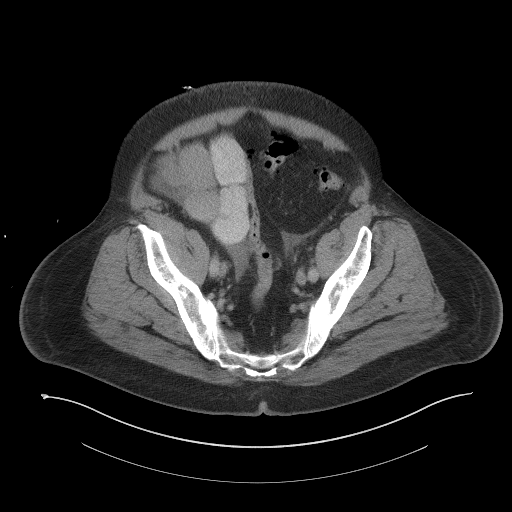
[im 34/101  soft-tissue]
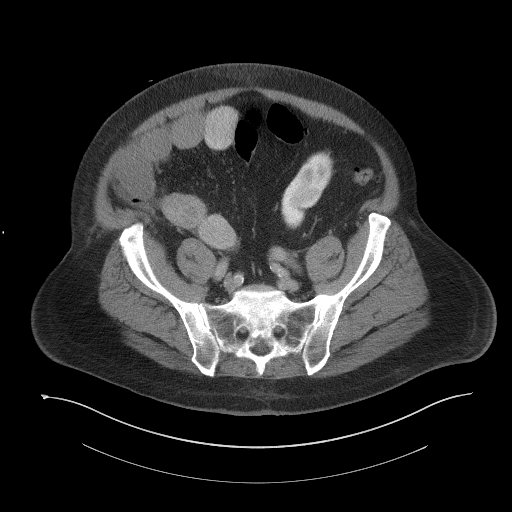
[im 45/101  soft-tissue]
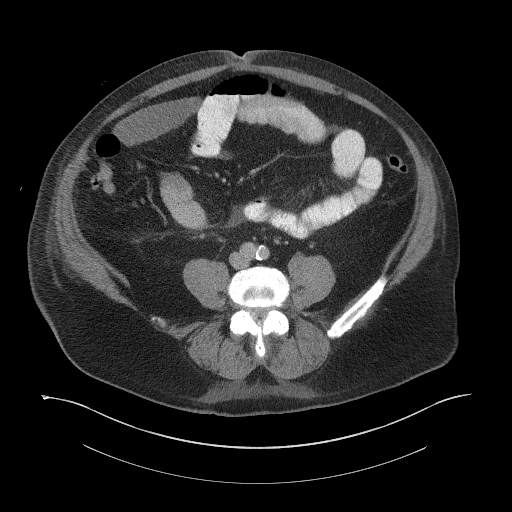
[im 51/101  soft-tissue]
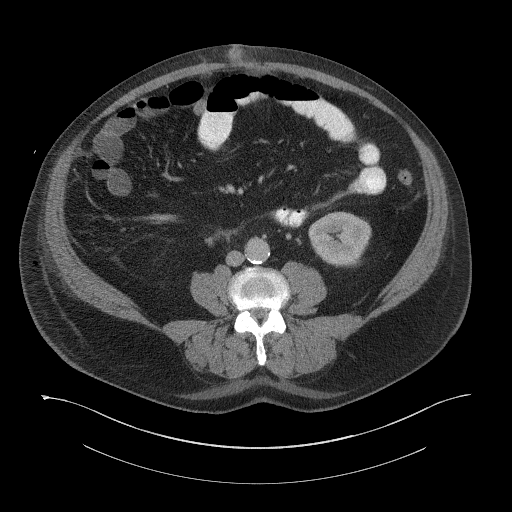
[im 56/101  soft-tissue]
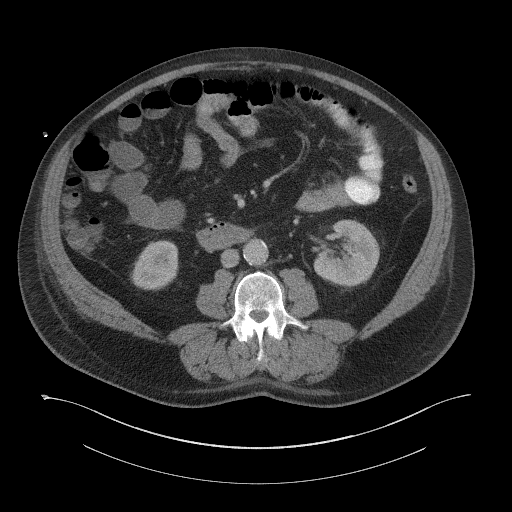
[im 67/101  soft-tissue]
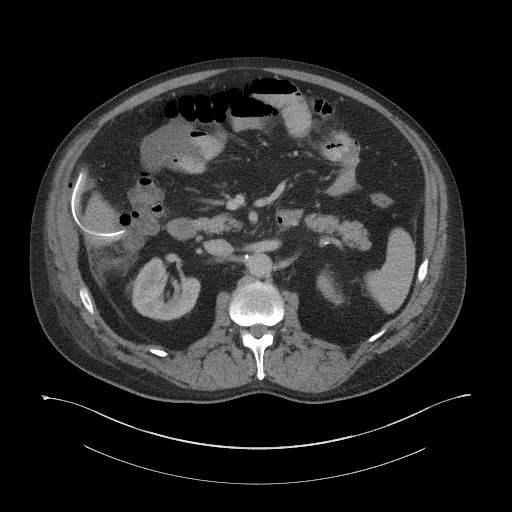
[im 67/101  bone]
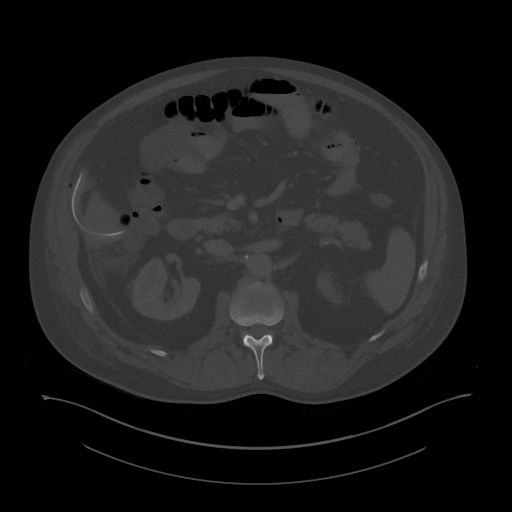
[im 73/101  soft-tissue]
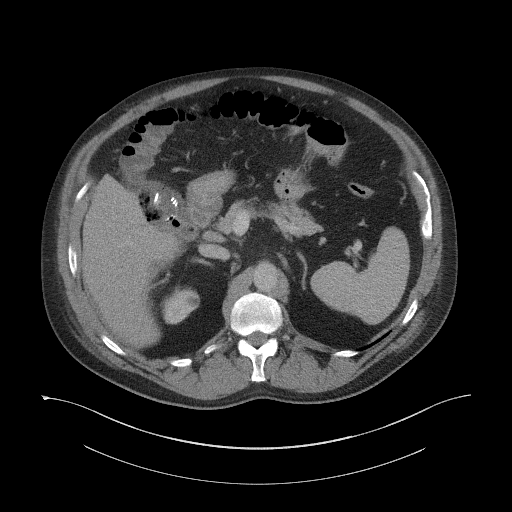
[im 78/101  soft-tissue]
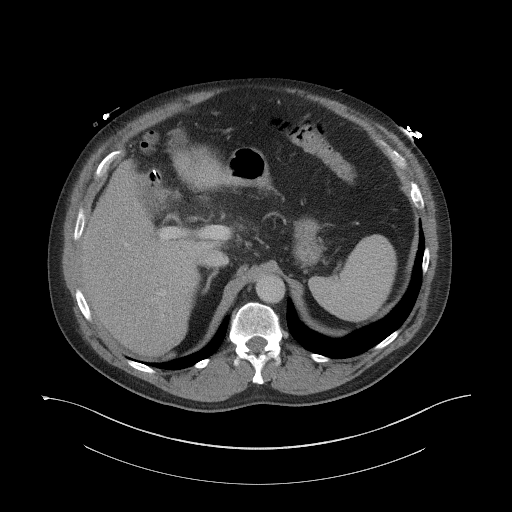
[im 89/101  soft-tissue]
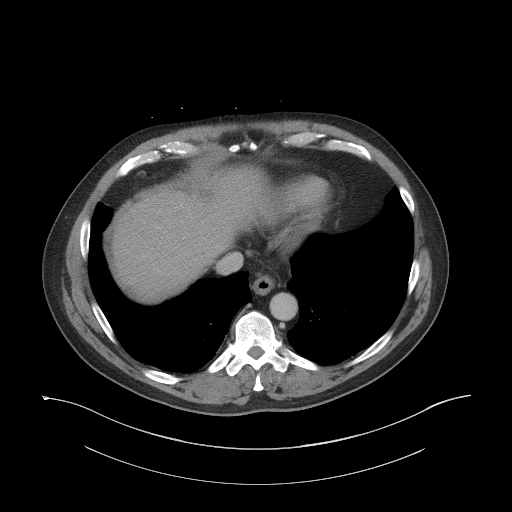
[im 95/101  soft-tissue]
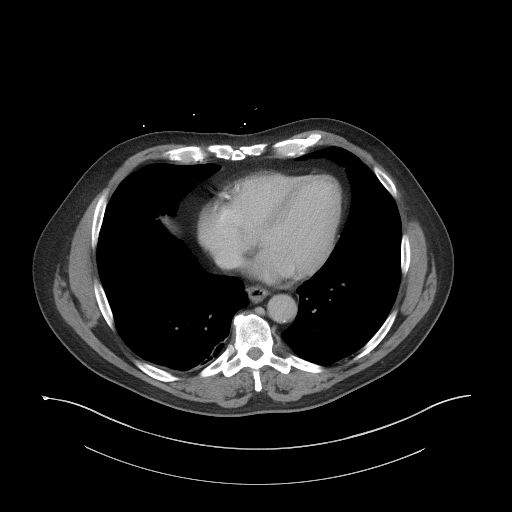

[Series 6: a/p w/ cor · coronal · 0.94mm/px · 3 of 187 slices shown]
[im 63/187  soft-tissue]
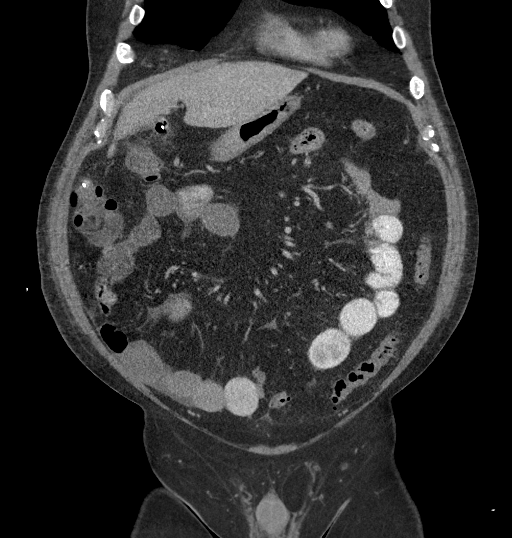
[im 83/187  soft-tissue]
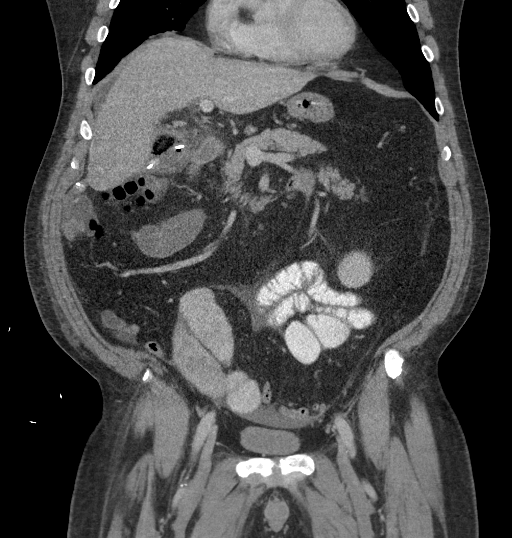
[im 104/187  soft-tissue]
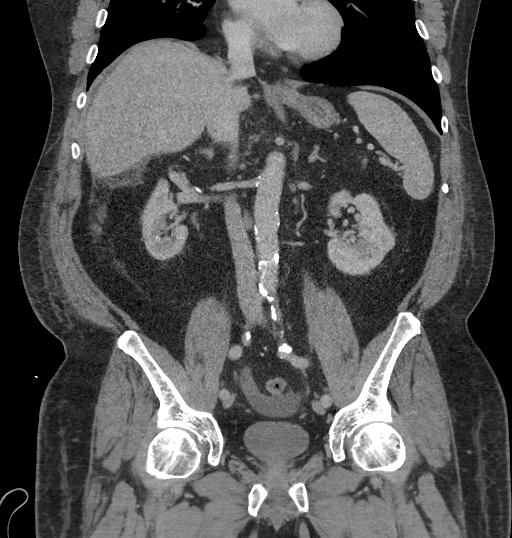

[16 of 46 positions shown; findings below may reference images not displayed]

FINDINGS: Lower chest: There is mild right lower lobe atelectasis and minimal
left lower lobe atelectasis

Hepatobiliary: The liver has a nodular surface contour, consistent
with cirrhosis. No focal liver abnormality is seen. Status post
cholecystectomy. There is a gas and fluid collection measuring 6.0 x
3.9 x 5.0 cm surrounding the tip of a surgical drain. An additional
gas and fluid collection is seen posterior and medial to this area
posterior to the duodenum measuring 3.0 x 1.5 x 2.1 cm. This is not
definitely in communication with the collection surrounding the
surgical drain. No biliary dilatation or pneumobilia.

Pancreas: Unremarkable. No pancreatic ductal dilatation or
surrounding inflammatory changes.

Spleen: Normal in size without focal abnormality.

Adrenals/Urinary Tract: Adrenal glands are unremarkable. Kidneys are
normal, without renal calculi, focal lesion, or hydronephrosis.
Bladder is unremarkable.

Stomach/Bowel: Stomach is within normal limits. No pericecal
inflammatory changes to suggest acute appendicitis. There is colonic
diverticulosis without evidence of diverticulitis. Multiple loops of
small bowel are distended and mildly dilated without a discrete
transition point to suggest a high-grade small bowel obstruction.
Enteric contrast reaches the midportion of the small bowel we are
becomes more dilute. No evidence of bowel wall thickening or
inflammatory changes.

Vascular/Lymphatic: Aortic atherosclerosis. An enlarged periportal
lymph node is likely sequela of the patient's hepatic cirrhosis. No
enlarged pelvic lymph nodes.

Reproductive: Prostate is unremarkable.

Other: There is a small amount of ascites. Fat stranding is seen in
the paraumbilical subcutaneous fat which may be postoperative. No
abdominal wall hernia is identified.

Musculoskeletal: No acute or significant osseous findings.
IMPRESSION: 1. Postoperative appearance of cholecystectomy with a gas and fluid
collection measuring 6.0 x 3.9 x 5.0 cm surrounding the tip of a
surgical drain in the gallbladder fossa. An additional gas and fluid
collection is seen posterior and medial to this area measuring 3.0 x
1.5 x 2.1 cm. This is not definitely in communication with the
collection surrounding the surgical drain.
2. Mildly dilated loops of small bowel without a discrete transition
point to suggest high-grade bowel obstruction. This is favored to
reflect postoperative ileus.
Hepatic cirrhosis.
3. Small volume ascites.

Aortic Atherosclerosis ([id number]).

## 2020-05-06 ENCOUNTER — Other Ambulatory Visit: Payer: Self-pay | Admitting: Cardiology

## 2020-06-11 ENCOUNTER — Ambulatory Visit (INDEPENDENT_AMBULATORY_CARE_PROVIDER_SITE_OTHER): Payer: Medicare HMO | Admitting: Gastroenterology

## 2020-06-11 DIAGNOSIS — Z23 Encounter for immunization: Secondary | ICD-10-CM

## 2020-06-11 DIAGNOSIS — K7030 Alcoholic cirrhosis of liver without ascites: Secondary | ICD-10-CM

## 2020-06-11 NOTE — Progress Notes (Signed)
Patient has been scheduled for a follow up with Dr Myrtie Neither for his cirrhosis. Patient has been advised of time/date/location of appointment and verbalizes understanding. He has also been advised of the importance of keeping this follow up appointment.

## 2020-06-13 ENCOUNTER — Other Ambulatory Visit: Payer: Self-pay | Admitting: Cardiology

## 2020-07-11 ENCOUNTER — Encounter: Payer: Self-pay | Admitting: Gastroenterology

## 2020-07-11 ENCOUNTER — Other Ambulatory Visit (INDEPENDENT_AMBULATORY_CARE_PROVIDER_SITE_OTHER): Payer: Medicare HMO

## 2020-07-11 ENCOUNTER — Ambulatory Visit: Payer: Medicare HMO | Admitting: Gastroenterology

## 2020-07-11 VITALS — BP 120/84 | HR 65 | Ht 70.0 in | Wt 218.0 lb

## 2020-07-11 DIAGNOSIS — K7030 Alcoholic cirrhosis of liver without ascites: Secondary | ICD-10-CM | POA: Diagnosis not present

## 2020-07-11 LAB — BASIC METABOLIC PANEL
BUN: 22 mg/dL (ref 6–23)
CO2: 27 mEq/L (ref 19–32)
Calcium: 10.1 mg/dL (ref 8.4–10.5)
Chloride: 105 mEq/L (ref 96–112)
Creatinine, Ser: 1.34 mg/dL (ref 0.40–1.50)
GFR: 55.21 mL/min — ABNORMAL LOW (ref >60.00)
Glucose, Bld: 88 mg/dL (ref 70–99)
Potassium: 4.5 mEq/L (ref 3.5–5.1)
Sodium: 138 mEq/L (ref 135–145)

## 2020-07-11 LAB — CBC WITH DIFFERENTIAL/PLATELET
Basophils Absolute: 0 10*3/uL (ref 0.0–0.1)
Basophils Relative: 0.5 % (ref 0.0–3.0)
Eosinophils Absolute: 0.2 10*3/uL (ref 0.0–0.7)
Eosinophils Relative: 3.4 % (ref 0.0–5.0)
HCT: 42.1 % (ref 39.0–52.0)
Hemoglobin: 14.4 g/dL (ref 13.0–17.0)
Lymphocytes Relative: 33 % (ref 12.0–46.0)
Lymphs Abs: 2.3 10*3/uL (ref 0.7–4.0)
MCHC: 34.3 g/dL (ref 30.0–36.0)
MCV: 96.1 fl (ref 78.0–100.0)
Monocytes Absolute: 0.5 10*3/uL (ref 0.1–1.0)
Monocytes Relative: 7.3 % (ref 3.0–12.0)
Neutro Abs: 3.9 10*3/uL (ref 1.4–7.7)
Neutrophils Relative %: 55.8 % (ref 43.0–77.0)
Platelets: 141 10*3/uL — ABNORMAL LOW (ref 150.0–400.0)
RBC: 4.38 Mil/uL (ref 4.22–5.81)
RDW: 14.2 % (ref 11.5–15.5)
WBC: 7 10*3/uL (ref 4.0–10.5)

## 2020-07-11 LAB — HEPATIC FUNCTION PANEL
ALT: 23 U/L (ref 0–53)
AST: 24 U/L (ref 0–37)
Albumin: 4.9 g/dL (ref 3.5–5.2)
Alkaline Phosphatase: 97 U/L (ref 39–117)
Bilirubin, Direct: 0.2 mg/dL (ref 0.0–0.3)
Total Bilirubin: 1.5 mg/dL — ABNORMAL HIGH (ref 0.2–1.2)
Total Protein: 7.7 g/dL (ref 6.0–8.3)

## 2020-07-11 NOTE — Patient Instructions (Signed)
If you are age 67 or older, your body mass index should be between 23-30. Your Body mass index is 31.28 kg/m. If this is out of the aforementioned range listed, please consider follow up with your Primary Care Provider.  If you are age 26 or younger, your body mass index should be between 19-25. Your Body mass index is 31.28 kg/m. If this is out of the aformentioned range listed, please consider follow up with your Primary Care Provider.   Your provider has requested that you go to the basement level for lab work before leaving today. Press "B" on the elevator. The lab is located at the first door on the left as you exit the elevator.  Due to recent changes in healthcare laws, you may see the results of your imaging and laboratory studies on MyChart before your provider has had a chance to review them.  We understand that in some cases there may be results that are confusing or concerning to you. Not all laboratory results come back in the same time frame and the provider may be waiting for multiple results in order to interpret others.  Please give [place] 48 hours in order for your provider to thoroughly review all the results before contacting the office for clarification of your results.   It was a pleasure to see you today!  Dr. Myrtie Neither

## 2020-07-11 NOTE — Progress Notes (Signed)
Randy Beasley Progress Note  Chief Complaint: Cirrhosis  Subjective  History: Last seen 11/10/2019 for cirrhosis related to previous alcohol abuse as well as fatty liver.  I met him during hospitalization for cholecystitis in late 2020.  After the last visit he was vaccinated for hep A/B, and he wanted to hold off on EGD for variceal screening and screening colonoscopy.  Randy Beasley reports feeling well since I last saw him.  He is disabled from an MI years ago, and spends much of his time reading or watching television.  His children live in [place] and [place], he last saw them at Thanksgiving. He denies abdominal pain, chest pain or dyspnea.  He has not noticed leg swelling or increasing abdominal girth. He recalls seeing primary care in August or September, had some lab work, and was tried on a statin again.  However, he had to discontinue it because it caused myalgias, which had occurred before.  He has not yet been back to see primary care about this but plans to do so.  He denies rectal bleeding, dysphagia or change in bowel habits.  He was upset that his insurance did not cover much or any of the cost of his hepatitis vaccines or his last ultrasound.  ROS: Cardiovascular:  no chest pain Respiratory: no dyspnea  The patient's Past Medical, Family and Social History were reviewed and are on file in the EMR.  Objective:  Med list reviewed  Current Outpatient Medications:  .  aspirin 81 MG tablet, Take 81 mg by mouth daily., Disp: , Rfl:  .  busPIRone (BUSPAR) 10 MG tablet, Take 10 mg by mouth 3 (three) times daily as needed. Anxiety, Disp: , Rfl: 10 .  furosemide (LASIX) 20 MG tablet, TAKE 1 TABLET BY MOUTH EVERY DAY, Disp: 90 tablet, Rfl: 3 .  levothyroxine (SYNTHROID) 125 MCG tablet, Take 125 mcg by mouth daily., Disp: , Rfl:  .  metoprolol tartrate (LOPRESSOR) 25 MG tablet, TAKE ONE-HALF TABLET (12.5 MG TOTAL) BY MOUTH 2 (TWO) TIMES DAILY., Disp: 90 tablet, Rfl: 3 .   nitroGLYCERIN (NITROSTAT) 0.4 MG SL tablet, Place 1 tablet (0.4 mg total) under the tongue every 5 (five) minutes x 3 doses as needed for chest pain., Disp: 25 tablet, Rfl: 12 .  ramipril (ALTACE) 5 MG capsule, TAKE 1 CAPSULE BY MOUTH EVERY DAY, Disp: 90 capsule, Rfl: 3 .  Vitamin D, Cholecalciferol, 25 MCG (1000 UT) TABS, Take 1 tablet by mouth daily., Disp: , Rfl:    Vital signs in last 24 hrs: Vitals:   07/11/20 0913  BP: 120/84  Pulse: 65   Wt Readings from Last 3 Encounters:  07/11/20 218 lb (98.9 kg)  11/24/19 214 lb (97.1 kg)  11/10/19 211 lb (95.7 kg)    Physical Exam  Pleasant and well-appearing  HEENT: sclera anicteric, oral mucosa moist without lesions  Neck: supple, no thyromegaly, JVD or lymphadenopathy  Cardiac: RRR without murmurs, S1S2 heard, no peripheral edema  Pulm: clear to auscultation bilaterally, normal RR and effort noted  Abdomen: soft, obese, no tenderness, with active bowel sounds. No guarding or palpable hepatosplenomegaly.  Skin; warm and dry, no jaundice or rash  Labs:  Last AFP 4.7 in January 2021 ___________________________________________ Radiologic studies:  CLINICAL DATA:  Cirrhosis.  Screening for hepatocellular carcinoma.   EXAM: ULTRASOUND ABDOMEN LIMITED RIGHT UPPER QUADRANT   COMPARISON:  05/12/2019.  05/03/2019.   FINDINGS: Gallbladder:   Surgically absent.   Common bile duct:   Diameter: 5.8  mm, normal.   Liver:   Heterogeneous echotexture. Slightly nodular surfaces. No evidence of focal mass lesion or ductal dilatation. Portal vein is patent on color Doppler imaging with normal direction of blood flow towards the liver.   Other: None.   IMPRESSION: Previous cholecystectomy. Ultrasound findings of cirrhosis. No evidence of liver mass lesion, biliary obstruction or portal flow reversal.     Electronically Signed   By: Nelson Chimes M.D.   On: 11/16/2019  14:02  ____________________________________________ Other:   _____________________________________________ Assessment & Plan  Assessment: Encounter Diagnosis  Name Primary?  . Alcoholic cirrhosis of liver without ascites ([place]) Yes   He appears stable overall, and is due for some routine surveillance issues related to his cirrhosis as well as colon cancer screening. We discussed the potential complications of cirrhosis including fluid overload, hepatic encephalopathy, liver cancer and esophageal/gastric varices with risk of Beasley bleeding.  I recommended lab work today including CBC, hepatic function panel and AFP. He should also have an ultrasound for liver cancer screening. He is also long overdue for screening colonoscopy and needs a screening upper endoscopy to look for varices.  Randy Beasley was appreciative of and understands the information, but says he cannot afford to do all of that testing. He was agreeable to lab work today, so orders were placed for hepatic function panel, CBC and AFP.  He was advised to follow a daily sodium restriction to 2000 mg or less,, both to decrease the chance of him developing volume overload but also for his cardiovascular health.  He was also encouraged to see primary care regarding his dyslipidemia and any other options for therapy since he does not tolerate statins.  I will see him back in 6 months or sooner if needed.  I encouraged him to contact us if he feels ready to proceed with ultrasound and endoscopic procedures  30 minutes were spent on this encounter (including chart review, history/exam, counseling/coordination of care, and documentation) > 50% of that time was spent on counseling and coordination of care.  Topics discussed included: See above.  Nelida Meuse III

## 2020-07-12 LAB — AFP TUMOR MARKER: AFP-Tumor Marker: 3.2 ng/mL (ref <6.1)

## 2020-08-26 IMAGING — US US ABDOMEN LIMITED
1 series · 14 of 25 positions shown · non-contrast
Comparison: 05/12/2019.  05/03/2019.

CLINICAL DATA: Cirrhosis.  Screening for hepatocellular carcinoma.

EXAM:
ULTRASOUND ABDOMEN LIMITED RIGHT UPPER QUADRANT

[Series 1: us abdomen limited · 14 of 44 slices shown]
[im 1/44]
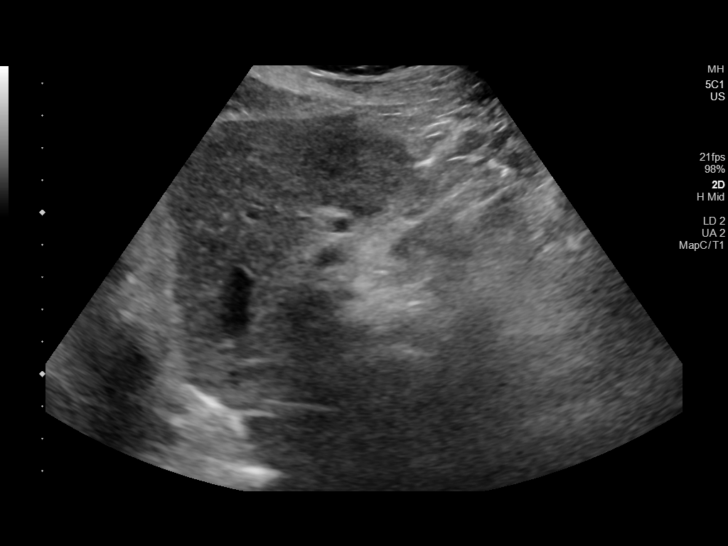
[im 4/44]
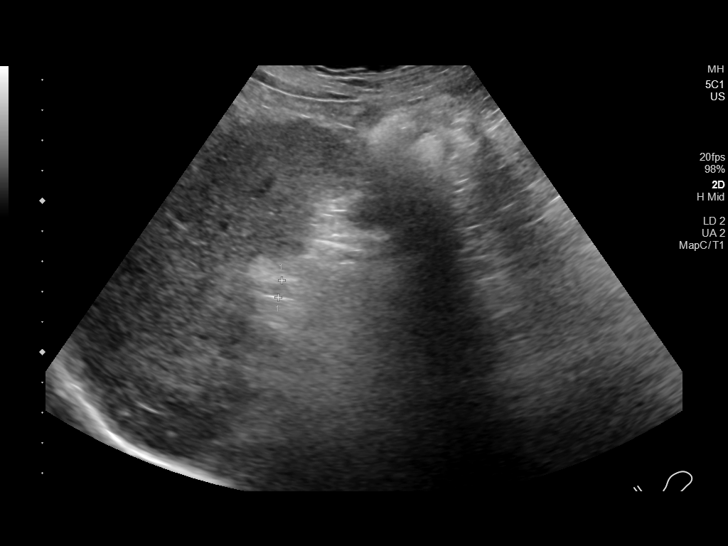
[im 8/44]
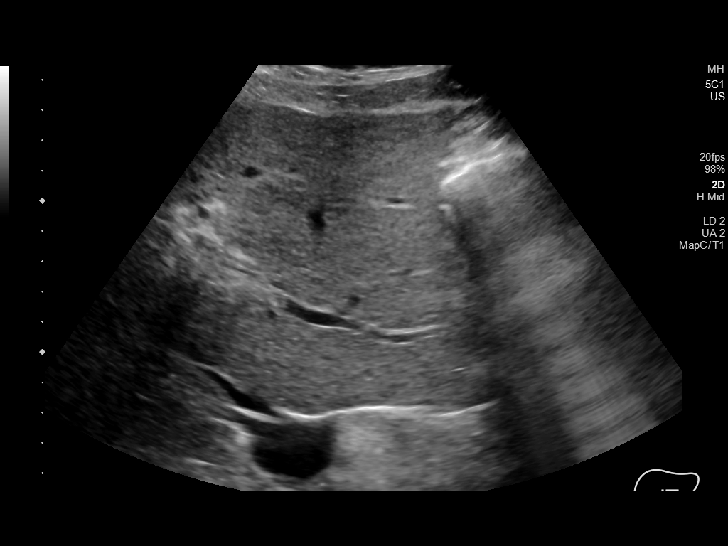
[im 11/44]
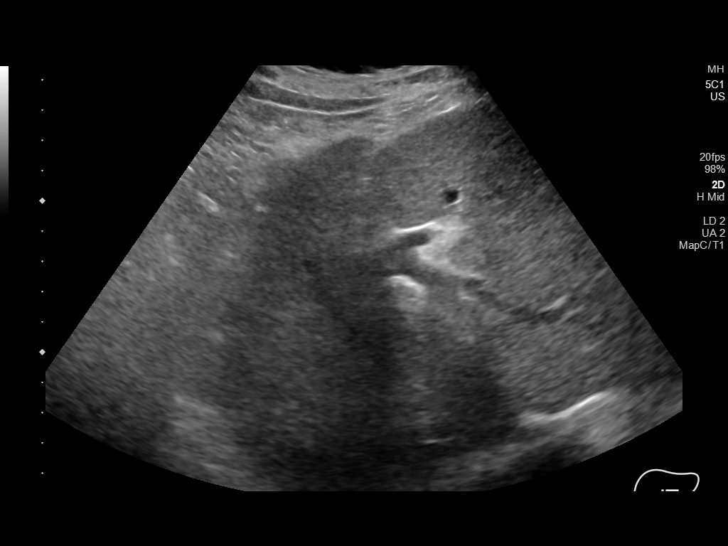
[im 15/44]
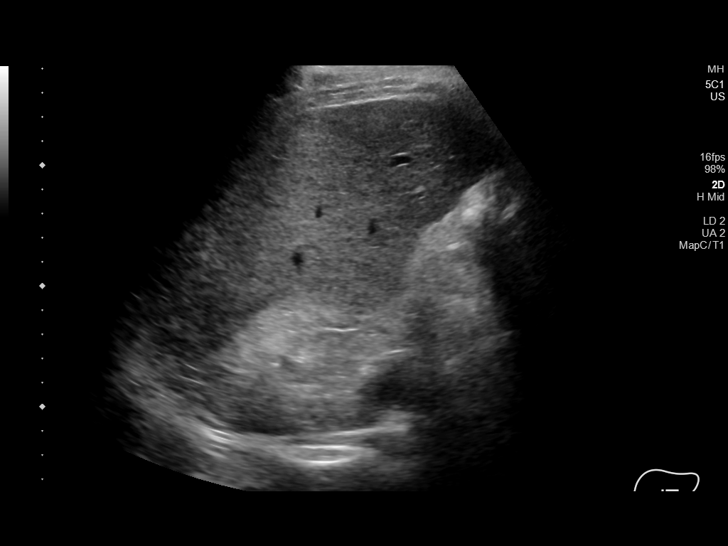
[im 17/44]
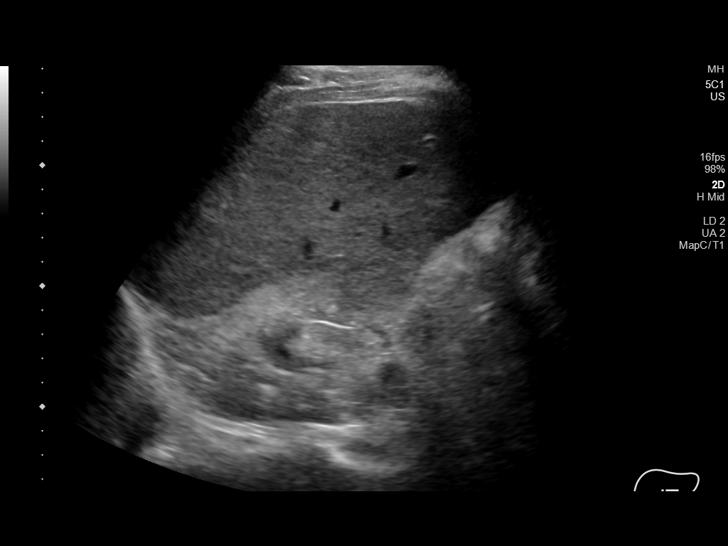
[im 20/44]
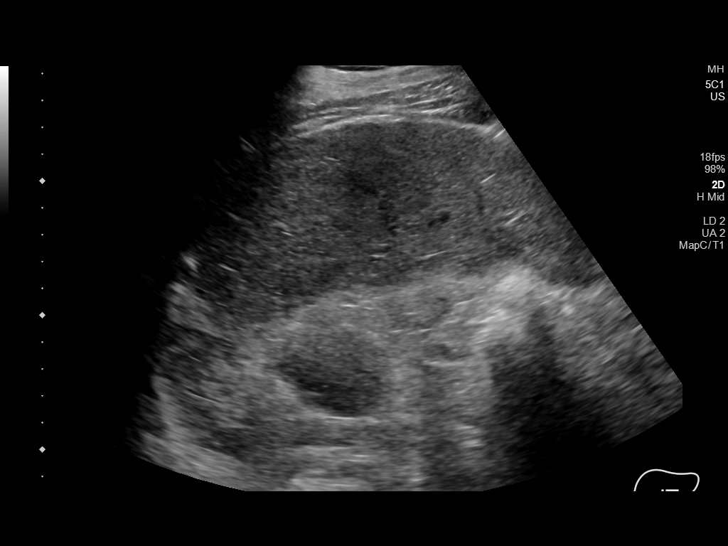
[im 24/44]
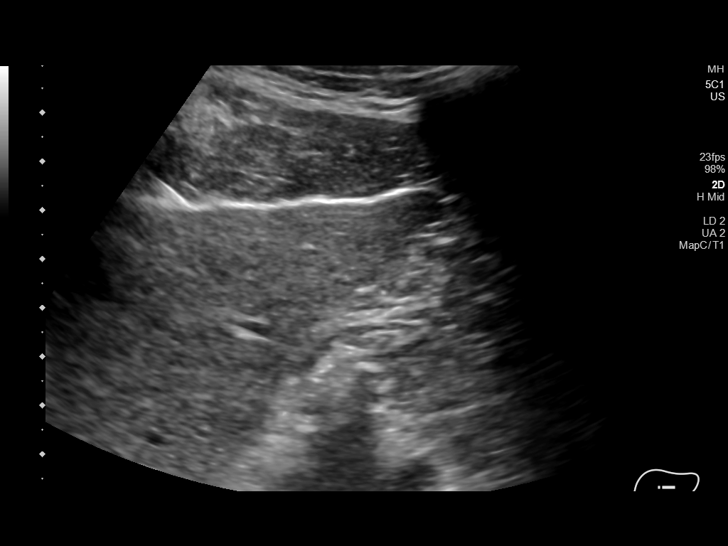
[im 27/44]
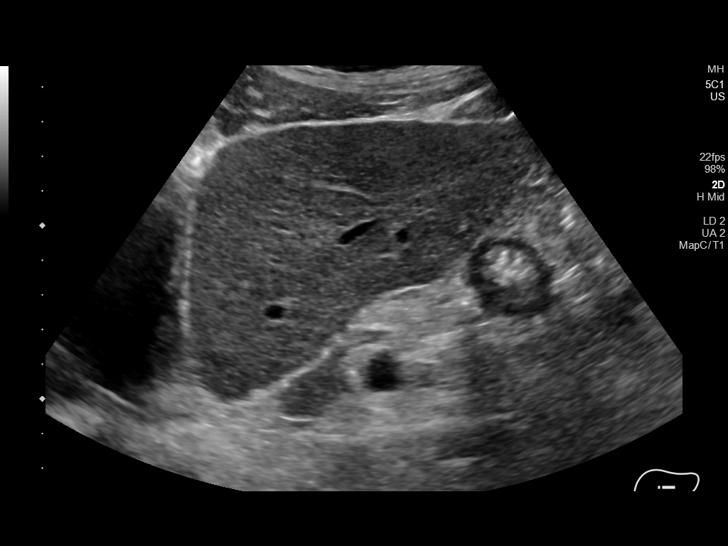
[im 29/44]
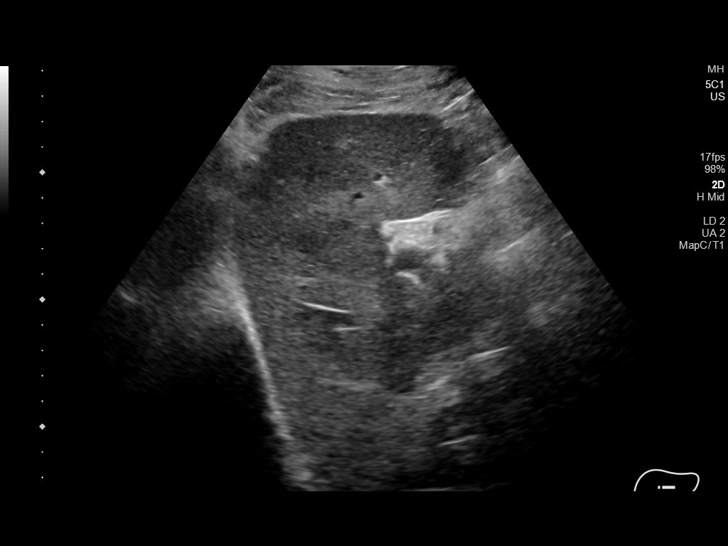
[im 33/44]
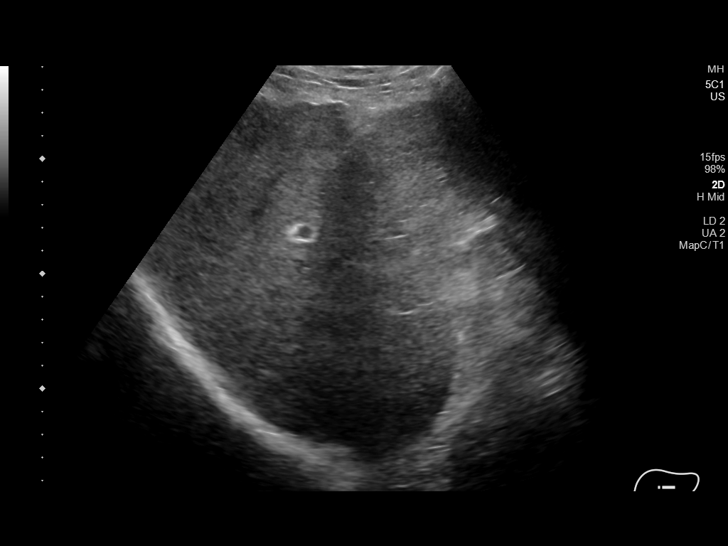
[im 36/44]
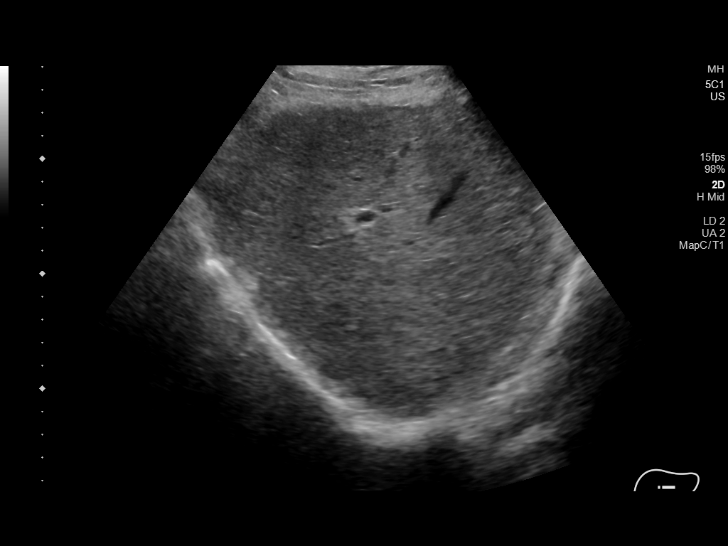
[im 40/44]
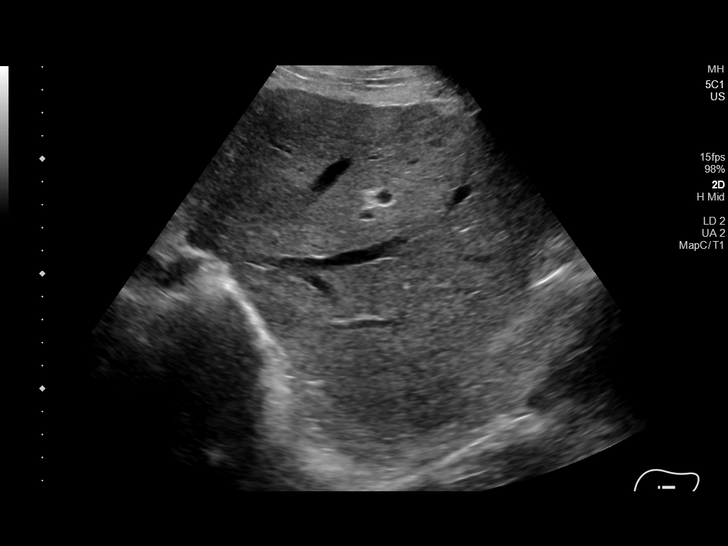
[im 44/44]
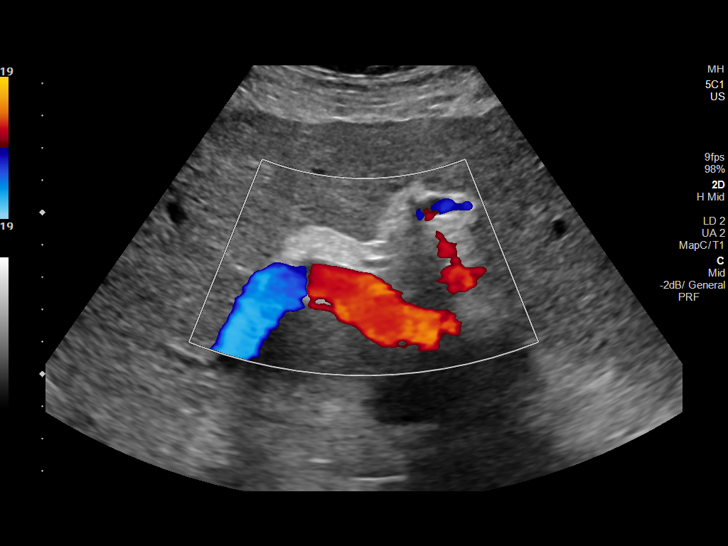

[14 of 25 positions shown; findings below may reference images not displayed]

FINDINGS: Gallbladder:

Surgically absent.

Common bile duct:

Diameter: 5.8 mm, normal.

Liver:

Heterogeneous echotexture. Slightly nodular surfaces. No evidence of
focal mass lesion or ductal dilatation. Portal vein is patent on
color Doppler imaging with normal direction of blood flow towards
the liver.

Other: None.
IMPRESSION: Previous cholecystectomy. Ultrasound findings of cirrhosis. No
evidence of liver mass lesion, biliary obstruction or portal flow
reversal.

## 2020-12-14 NOTE — Progress Notes (Signed)
Cardiology Office Note   Date:  12/20/2020   ID:  Randy Beasley, DOB 15-Dec-1955, MRN [id number]  PCP:  Pearson Forster, MD  Cardiologist:  Dr [place]  [name], MD   Chief Complaint  Patient presents with   Coronary Artery Disease     History of Present Illness: Randy Beasley is a 65 y.o. male with a history of STEMI 04/2014 w/ DES LAD residual disease in LCx of 60-70% and RCA of 50% treated medically, w/ EF 40%>>25%>>35% 07/2014 (refused ICD), remote ETOH & tobacco, transaminitis 2nd statins.  He has developed transaminitis on even low dose Crestor/lipitor. He has been unable  to afford Zetia. On and off again insurance coverage.   Was referred to Research previously but LFTs still too high to qualify for clinical trial. He is followed by Dr Myrtie Neither for alcoholic cirrhosis.   He was admitted with acute cholecystitis in November 2020 and underwent cholecystectomy. Echo at that time showed EF had improved from 30-35% to 40-45% with apical akinesis. He states that since then he has felt much better.    He woke up one morning in April with a blind spot in his right eye. 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 It is improving. carotid dopplers were negative.   On follow up today he states he is doing well from a cardiac standpoint. Up until 2 weeks ago he was walking 5 miles/day. Denied any chest pain, dyspnea, palpitations. Developed plantar fasciitis and this has limited his activity. He has lost an additional 9 lbs. He has not had any Etoh in 2 years and hasn't smoked in a year.  Lab work followed by primary care. Notes he was tried on a low dose statin but developed severe myalgias.    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    Osteoarthritis    Seizures (HCC)    last was > 20 years ago   Spinal stenosis    STEMI (ST elevation myocardial infarction) (HCC) 04/28/14   with stent to LAD   Vision loss of right eye    mini stroke     Past Surgical History:  Procedure Laterality Date   APPENDECTOMY     BACK SURGERY     CHOLECYSTECTOMY N/A 05/05/2019   Procedure: LAPAROSCOPIC CHOLECYSTECTOMY;  Surgeon: Abigail Miyamoto, MD;  Location: [facility] OR;  Service: General;  Laterality: N/A;   CORONARY ANGIOPLASTY WITH STENT PLACEMENT  04/28/14   Promus DES to LAD   LEFT HEART CATH N/A 04/28/2014   Procedure: LEFT HEART CATH;  Surgeon: Misty Rago M [place], MD;  Location: [facility] CATH LAB;  Service: Cardiovascular;  Laterality: N/A;   NECK SURGERY     TONSILLECTOMY      Allergies as of 12/20/2020       Reactions   Statins    Patient complains of aches        Medication List        Accurate as of December 20, 2020  8:46 AM. If you have any questions, ask your nurse or doctor.          aspirin 81 MG tablet Take  81 mg by mouth daily.   busPIRone 10 MG tablet Commonly known as: BUSPAR Take 10 mg by mouth 3 (three) times daily as needed. Anxiety   furosemide 20 MG tablet Commonly known as: LASIX TAKE 1 TABLET BY MOUTH EVERY DAY   levothyroxine 112 MCG tablet Commonly known as: SYNTHROID Take 112 mcg by mouth daily. What changed: Another medication with the same name was removed. Continue taking this medication, and follow the directions you see here. Changed by: Lavonda Thal [place], MD   metoprolol tartrate 25 MG tablet Commonly known as: LOPRESSOR TAKE ONE-HALF TABLET (12.5 MG TOTAL) BY MOUTH 2 (TWO) TIMES DAILY.   nitroGLYCERIN 0.4 MG SL tablet Commonly known as: NITROSTAT Place 1 tablet (0.4 mg total) under the tongue every 5 (five) minutes x 3 doses as needed for chest pain.   ramipril 5 MG capsule Commonly known as: ALTACE TAKE 1 CAPSULE BY MOUTH EVERY DAY   Vitamin D (Cholecalciferol)  25 MCG (1000 UT) Tabs Take 1 tablet by mouth daily.        Allergies:   Statins    Social History:  The patient  reports that he quit smoking about 7 years ago. His smoking use included cigarettes. He quit smokeless tobacco use about 38 years ago. He reports current alcohol use of about 3.0 - 5.0 standard drinks of alcohol per week. He reports that he does not use drugs.   Family History:  The patient's family history includes Heart disease in his brother and father; Hypertension in his mother; Peripheral vascular disease in his mother; Stroke in his father.    ROS:  Please see the history of present illness. All other systems are reviewed and negative.    PHYSICAL EXAM: VS:  BP 100/70 (BP Location: Left Arm, Patient Position: Sitting, Cuff Size: Large)   Pulse (!) 58   Ht 5\' 10"  (1.778 m)   Wt 209 lb 6.4 oz (95 kg)   SpO2 99%   BMI 30.05 kg/m  , BMI Body mass index is 30.05 kg/m. GENERAL:  Well appearing obese WM in NAD HEENT:  PERRL, EOMI, sclera are clear. Oropharynx is clear. NECK:  No jugular venous distention, carotid upstroke brisk and symmetric, no bruits, no thyromegaly or adenopathy LUNGS:  Clear to auscultation bilaterally CHEST:  Unremarkable HEART:  RRR,  PMI not displaced or sustained,S1 and S2 within normal limits, no S3, no S4: no clicks, no rubs, no murmurs ABD:  Soft, nontender. BS +, no masses or bruits. No hepatomegaly, no splenomegaly EXT:  2 + pulses throughout, no edema, no cyanosis no clubbing SKIN:  Warm and dry.  No rashes NEURO:  Alert and oriented x 3. Cranial nerves II through XII intact. PSYCH:  Cognitively intact    EKG:  EKG is  ordered today. NSR rate 58. Low voltage. Old septal infarct. No acute change. I have personally reviewed and interpreted this study.   Recent Labs: 07/11/2020: ALT 23; BUN 22; Creatinine, Ser 1.34; Hemoglobin 14.4; Platelets 141.0; Potassium 4.5; Sodium 138    Lipid Panel    Component Value Date/Time   CHOL 183  05/05/2019 0325   TRIG 98 05/05/2019 0325   HDL 40 (L) 05/05/2019 0325   CHOLHDL 4.6 05/05/2019 0325   VLDL 20 05/05/2019 0325   LDLCALC 123 (H) 05/05/2019 0325     Wt Readings from Last 3 Encounters:  12/20/20 209 lb 6.4 oz (95 kg)  07/11/20 218 lb (98.9 kg)  11/24/19 214 lb (97.1 kg)  Other studies Reviewed: Additional studies/ records that were reviewed today include:  Labs dated 12/24/16: cholesterol 231, triglycerides 133, HDL 43, LDL 161. AST 57, ALT 56. Creatinine 1.37. Other chemistries, TSH, CBC normal.   Echo 05/04/19: IMPRESSIONS     1. Left ventricular ejection fraction, by visual estimation, is 40 to  45%. The left ventricle has mild to moderately decreased function. There  is moderately increased left ventricular hypertrophy. Very difficult study  for wall motion even with Definity  use. The mid to apical anteroseptal wall and the true apex appeared  akinetic.   2. Definity contrast agent was given IV to delineate the left ventricular  endocardial borders.   3. Left ventricular diastolic parameters are consistent with Grade I  diastolic dysfunction (impaired relaxation).   4. Global right ventricle has normal systolic function.The right  ventricular size is normal. No increase in right ventricular wall  thickness.   5. Left atrial size was normal.   6. Right atrial size was not well visualized.   7. The mitral valve is normal in structure. No evidence of mitral valve  regurgitation. No evidence of mitral stenosis.   8. The tricuspid valve is not well visualized. Tricuspid valve  regurgitation is not demonstrated.   9. The aortic valve is tricuspid. Aortic valve regurgitation is not  visualized. Mild aortic valve sclerosis without stenosis.  10. TR signal is inadequate for assessing pulmonary artery systolic  pressure.  11. Technically difficult study with very poor acoustic windows.   ASSESSMENT AND PLAN:  1.  Chronic systolic CHF: last EF 40-45%.  appears well compensated today.   We will continue Lasix 20 mg daily. Continue metoprolol and ACEi.   Low sodium diet.   2. CAD, chest pain: s/p DES of LAD in November 2015. He had a 70% circumflex and a 50% RCA at that time that were treated medically. No significant angina.  He is on aspirin,  an ACE inhibitor, and a beta blocker. I  3. Dyslipidemia. Continue dietary and exercise modification. Not a candidate for statin due to myalgias.  Unable to afford Zetia or PCSK 9 inhibitor. To have follow up labs with Dr Tresa Endo. In the past he was not considered a candidate for research trial but with weight loss LFTs have normalized so this was all related to fatty liver. We could reconsider research trial depending on lab results.   4. Tobacco abuse- hasn't smoked in a year  5. History of Etoh abuse. Has not had any Etoh in a year.    Current medicines are reviewed at length with the patient today.  The patient does not have concerns regarding medicines.  The following changes have been made:  none  Labs/ tests ordered today include:   No orders of the defined types were placed in this encounter.    Disposition:   FU with me in one year.  Signed, Lakesia Dahle [place], MD  12/20/2020 8:46 AM    [facility]

## 2020-12-20 ENCOUNTER — Ambulatory Visit (INDEPENDENT_AMBULATORY_CARE_PROVIDER_SITE_OTHER): Payer: Medicare HMO | Admitting: Cardiology

## 2020-12-20 ENCOUNTER — Other Ambulatory Visit: Payer: Self-pay

## 2020-12-20 ENCOUNTER — Encounter: Payer: Self-pay | Admitting: Cardiology

## 2020-12-20 VITALS — BP 100/70 | HR 58 | Ht 70.0 in | Wt 209.4 lb

## 2020-12-20 DIAGNOSIS — I25118 Atherosclerotic heart disease of native coronary artery with other forms of angina pectoris: Secondary | ICD-10-CM | POA: Diagnosis not present

## 2020-12-20 DIAGNOSIS — I5022 Chronic systolic (congestive) heart failure: Secondary | ICD-10-CM | POA: Diagnosis not present

## 2020-12-20 DIAGNOSIS — Z720 Tobacco use: Secondary | ICD-10-CM | POA: Diagnosis not present

## 2020-12-20 DIAGNOSIS — E7800 Pure hypercholesterolemia, unspecified: Secondary | ICD-10-CM

## 2020-12-21 ENCOUNTER — Encounter: Payer: Self-pay | Admitting: Cardiology

## 2020-12-21 NOTE — Telephone Encounter (Signed)
    Pt said Karel Jarvis called him last night saying she made a mistake and asked him to call back, pt returning call

## 2020-12-21 NOTE — Telephone Encounter (Signed)
This encounter was created in error - please disregard.

## 2021-12-16 ENCOUNTER — Ambulatory Visit: Payer: Medicare HMO | Admitting: Physician Assistant

## 2021-12-16 VITALS — BP 131/81 | HR 65 | Ht 70.0 in | Wt 206.4 lb

## 2021-12-16 DIAGNOSIS — I5022 Chronic systolic (congestive) heart failure: Secondary | ICD-10-CM | POA: Diagnosis not present

## 2021-12-16 DIAGNOSIS — E785 Hyperlipidemia, unspecified: Secondary | ICD-10-CM | POA: Diagnosis not present

## 2021-12-16 DIAGNOSIS — I2510 Atherosclerotic heart disease of native coronary artery without angina pectoris: Secondary | ICD-10-CM | POA: Diagnosis not present

## 2021-12-16 NOTE — Patient Instructions (Signed)
Medication Instructions:  Your physician recommends that you continue on your current medications as directed. Please refer to the Current Medication list given to you today.  *If you need a refill on your cardiac medications before your next appointment, please call your pharmacy*  Lab Work: NONE ordered at this time of appointment   If you have labs (blood work) drawn today and your tests are completely normal, you will receive your results only by: MyChart Message (if you have MyChart) OR A paper copy in the mail If you have any lab test that is abnormal or we need to change your treatment, we will call you to review the results.  Testing/Procedures: NONE ordered at this time of appointment   Follow-Up: At [facility], you and your health needs are our priority.  As part of our continuing mission to provide you with exceptional heart care, we have created designated Provider Care Teams.  These Care Teams include your primary Cardiologist (physician) and Advanced Practice Providers (APPs -  Physician Assistants and Nurse Practitioners) who all work together to provide you with the care you need, when you need it.    Your next appointment:   1 year(s)  The format for your next appointment:   In Person  Provider:   Peter [place], MD     Other Instructions   Important Information About Sugar

## 2021-12-16 NOTE — Progress Notes (Signed)
Cardiology Office Note:    Date:  12/18/2021   ID:  [name], DOB 1955-12-14, MRN [id number]  PCP:  Randy Anchors, MD   [facility] Cardiologist:  Randy Martinique, MD     Referring MD: Randy Anchors, MD note, Randy Beasley has retired.  She is scheduled to see her new primary care provider at [facility] primary care, likely Dr. Leota Beasley  Chief Complaint  Patient presents with   Follow-up    Seen for Dr. Martinique    History of Present Illness:    Randy Beasley is a 66 y.o. male with a hx of CAD, remote EtOH and tobacco abuse, hypothyroidism, chronic systolic heart failure, ischemic cardiomyopathy, history of remote seizure and hyperlipidemia.  Patient had STEMI in November 2015 and underwent DES to LAD, he had 60 to 70% residual left circumflex, 50% residual RCA that were treated medically.  Ejection fraction remained low after MI and he has previously refused ICD.  He was admitted with acute cholecystitis in November 2020, echocardiogram at the time showed EF has improved from the previous 30 to 35% up to 40 to 45% with apical akinesis.  He has a history of transaminitis given low-dose Crestor and Lipitor and unable to afford Zetia.  He was previously referred to research however LFT was too high to qualify for clinical trials.  He is being followed by Dr. Simona Huh for alcoholic cirrhosis.  Patient was previously seen by Dr. 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  Carotid Doppler was negative.  During the last visit, he was doing well.  Patient presents today for follow-up.  He denies any recent chest pain or worsening dyspnea.  He still have back pain and joint pain.  EKG showed Q waves in the anterior leads, this is likely related to the previous STEMI from 2015.  Given lack of symptom, he can follow-up in 1 year.  Blood work has been followed by  his PCPs office, he is scheduled to see his new PCP in September after Dr. Olin Hauser retired.  We discussed about the possibility of referral to lipid clinic, he says his cholesterol actually looks better after weight loss, he is not interested in a cholesterol medication at this time.  He can follow-up in 1 year, earlier if he has any symptoms.   Past Medical History:  Diagnosis Date   Abnormal LFTs    Anxiety    CAD (coronary artery disease)    residual disease to LCX and RCA   Chronic systolic CHF (congestive heart failure) ([place]) 04/05/2015   Hypothyroidism    Ischemic cardiomyopathy    EF 25-30%   Mini stroke    vision loss right eye    Osteoarthritis    Seizures ([place])    last was > 20 years ago   Spinal stenosis    STEMI (ST elevation myocardial infarction) ([place]) 04/28/14   with stent to LAD   Vision loss of right eye    mini stroke     Past Surgical History:  Procedure Laterality Date   APPENDECTOMY     BACK SURGERY     CHOLECYSTECTOMY N/A 05/05/2019   Procedure: LAPAROSCOPIC CHOLECYSTECTOMY;  Surgeon: Randy Keens, MD;  Location: [place];  Service: General;  Laterality: N/A;   CORONARY ANGIOPLASTY WITH STENT PLACEMENT  04/28/14   Promus DES  to LAD   LEFT HEART CATH N/A 04/28/2014   Procedure: LEFT HEART CATH;  Surgeon: Randy M Swaziland, MD;  Location: [facility] CATH LAB;  Service: Cardiovascular;  Laterality: N/A;   NECK SURGERY     TONSILLECTOMY      Current Medications: Current Meds  Medication Sig   aspirin 81 MG tablet Take 81 mg by mouth daily.   levothyroxine (SYNTHROID) 112 MCG tablet Take 112 mcg by mouth daily.   ramipril (ALTACE) 5 MG capsule TAKE 1 CAPSULE BY MOUTH EVERY DAY   Vitamin D, Cholecalciferol, 25 MCG (1000 UT) TABS Take 1 tablet by mouth daily.     Allergies:   Statins   Social History   Socioeconomic History   Marital status: Single    Spouse name: Not on file   Number of children: Not on file   Years of education: Not on file    Highest education level: Not on file  Occupational History   Occupation: disable  Tobacco Use   Smoking status: Former    Packs/day: 0.00    Years: 42.00    Total pack years: 0.00    Types: Cigarettes    Quit date: 07/29/2013    Years since quitting: 8.3   Smokeless tobacco: Former    Quit date: 02/14/1982  Substance and Sexual Activity   Alcohol use: Yes    Alcohol/week: 3.0 - 5.0 standard drinks of alcohol    Types: 3 - 5 Cans of beer per week    Comment: previously a case of beer a week, 3-4 beers a day, 5 years.  Has not drank in several weeks.   Drug use: No   Sexual activity: Yes  Other Topics Concern   Not on file  Social History Narrative   Lives in [place].  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  ROS:   Please see the history of present illness.     All other systems reviewed and are negative.  EKGs/Labs/Other Studies Reviewed:    The following studies were reviewed today:  Echo 05/04/2019  1. Left ventricular ejection fraction, by visual estimation, is 40 to  45%. The left ventricle has mild to moderately decreased function. There  is moderately increased left ventricular hypertrophy. Very difficult study  for wall motion even with Definity  use. The mid to apical anteroseptal wall and the true apex appeared  akinetic.   2. Definity contrast agent was given IV to delineate the left ventricular  endocardial borders.   3. Left ventricular diastolic parameters are consistent with Grade I  diastolic  dysfunction (impaired relaxation).   4. Global right ventricle has normal systolic function.The right  ventricular size is normal. No increase in right ventricular wall  thickness.   5. Left atrial size was normal.   6. Right atrial size was not well visualized.   7. The mitral valve is normal in structure. No evidence of mitral valve  regurgitation. No evidence of mitral stenosis.   8. The tricuspid valve is not well  visualized. Tricuspid valve  regurgitation is not demonstrated.   9. The aortic valve is tricuspid. Aortic valve regurgitation is not  visualized. Mild aortic valve sclerosis without stenosis.  10. TR signal is inadequate for assessing pulmonary artery systolic  pressure.  11. Technically difficult study with very poor acoustic windows.   EKG:  EKG is ordered today.  The ekg ordered today demonstrates normal sinus rhythm, Q waves in the anterior leads.  Recent Labs: No results found for requested labs within last 365 days.  Recent Lipid Panel    Component Value Date/Time   CHOL 183 05/05/2019 0325   TRIG 98 05/05/2019 0325   HDL 40 (L) 05/05/2019 0325   CHOLHDL 4.6 05/05/2019 0325   VLDL 20 05/05/2019 0325   LDLCALC 123 (H) 05/05/2019 0325     Risk Assessment/Calculations:           Physical Exam:    VS:  BP 131/81   Pulse 65   Ht 5\' 10"  (1.778 m)   Wt 206 lb 6.4 oz (93.6 kg)   SpO2 98%   BMI 29.62 kg/m     Wt Readings from Last 3 Encounters:  12/16/21 206 lb 6.4 oz (93.6 kg)  12/20/20 209 lb 6.4 oz (95 kg)  07/11/20 218 lb (98.9 kg)     GEN:  Well nourished, well developed in no acute distress HEENT: Normal NECK: No JVD; No carotid bruits LYMPHATICS: No lymphadenopathy CARDIAC: RRR, no murmurs, rubs, gallops RESPIRATORY:  Clear to auscultation without rales, wheezing or rhonchi  ABDOMEN: Soft, non-tender, non-distended MUSCULOSKELETAL:  No edema; No deformity  SKIN: Warm and dry NEUROLOGIC:  Alert and oriented x 3 PSYCHIATRIC:  Normal  affect   ASSESSMENT:    1. Coronary artery disease involving native coronary artery of native heart without angina pectoris   2. Chronic systolic CHF (congestive heart failure) (HCC)   3. Hyperlipidemia LDL goal <70    PLAN:    In order of problems listed above:  Coronary artery disease: Denies any chest pain.  Previous STEMI in November 2015 due to occlusion of LAD, this was treated with DES.  He has not had any recent chest pain.  Chronic systolic heart failure: EF 40 to 45% on last echocardiogram in 2020.  He appears to be euvolemic on exam  Hyperlipidemia: He is not on a statin medication due to history of elevated transaminases.  He has a history of cirrhosis.  We discussed the possibility of referral to lipid clinic, he is not interested in any cholesterol medication at this time.           Medication Adjustments/Labs and Tests Ordered: Current medicines are reviewed at length with the patient today.  Concerns regarding medicines are outlined above.  Orders Placed This Encounter  Procedures   EKG 12-Lead   No orders of the defined types were placed in this encounter.   Patient Instructions  Medication Instructions:  Your physician recommends that you continue on your current medications as directed. Please refer to the Current Medication list given to you today.  *If you need a refill on your cardiac medications before your next appointment, please call your pharmacy*  Lab Work: NONE ordered at this time of appointment   If you have labs (blood work) drawn today and your tests are completely normal, you will receive your results only by: MyChart Message (if you have MyChart) OR A paper copy in the mail If you have any lab test that is abnormal or  we need to change your treatment, we will call you to review the results.  Testing/Procedures: NONE ordered at this time of appointment   Follow-Up: At [facility], you and your health needs are our priority.  As  part of our continuing mission to provide you with exceptional heart care, we have created designated Provider Care Teams.  These Care Teams include your primary Cardiologist (physician) and Advanced Practice Providers (APPs -  Physician Assistants and Nurse Practitioners) who all work together to provide you with the care you need, when you need it.    Your next appointment:   1 year(s)  The format for your next appointment:   In Person  Provider:   Peter Martinique, MD     Other Instructions   Important Information About Sugar         Hilbert Corrigan, [place]  12/18/2021 11:16 PM    [place]

## 2021-12-18 ENCOUNTER — Encounter: Payer: Self-pay | Admitting: Physician Assistant
